# Patient Record
Sex: Male | Born: 1960 | Race: Black or African American | Hispanic: No | Marital: Married | Smoking: Never smoker
Health system: Southern US, Community
[De-identification: ages and names within clinical notes are randomized; demographics above are authoritative.]

## PROBLEM LIST (undated history)

## (undated) DIAGNOSIS — I10 Essential (primary) hypertension: Secondary | ICD-10-CM

## (undated) HISTORY — PX: BACK SURGERY: SHX140

---

## 2000-10-12 ENCOUNTER — Ambulatory Visit: Admission: RE | Admit: 2000-10-12 | Discharge: 2000-10-12 | Payer: Self-pay | Admitting: Neurosurgery

## 2000-10-23 ENCOUNTER — Inpatient Hospital Stay (HOSPITAL_COMMUNITY): Admission: RE | Admit: 2000-10-23 | Discharge: 2000-10-25 | Payer: Self-pay | Admitting: Neurosurgery

## 2000-10-23 ENCOUNTER — Encounter: Payer: Self-pay | Admitting: Neurosurgery

## 2003-09-05 ENCOUNTER — Inpatient Hospital Stay (HOSPITAL_COMMUNITY): Admission: AD | Admit: 2003-09-05 | Discharge: 2003-09-08 | Payer: Self-pay | Admitting: Psychiatry

## 2006-09-12 ENCOUNTER — Encounter: Admission: RE | Admit: 2006-09-12 | Discharge: 2006-09-12 | Payer: Self-pay | Admitting: Orthopedic Surgery

## 2006-11-28 ENCOUNTER — Inpatient Hospital Stay (HOSPITAL_COMMUNITY): Admission: RE | Admit: 2006-11-28 | Discharge: 2006-11-30 | Payer: Self-pay | Admitting: Orthopedic Surgery

## 2006-11-28 ENCOUNTER — Encounter (INDEPENDENT_AMBULATORY_CARE_PROVIDER_SITE_OTHER): Payer: Self-pay | Admitting: Specialist

## 2008-05-11 ENCOUNTER — Emergency Department (HOSPITAL_COMMUNITY): Admission: EM | Admit: 2008-05-11 | Discharge: 2008-05-11 | Payer: Self-pay | Admitting: Emergency Medicine

## 2008-07-01 ENCOUNTER — Emergency Department (HOSPITAL_COMMUNITY): Admission: EM | Admit: 2008-07-01 | Discharge: 2008-07-01 | Payer: Self-pay | Admitting: Emergency Medicine

## 2008-07-28 ENCOUNTER — Encounter: Admission: RE | Admit: 2008-07-28 | Discharge: 2008-07-28 | Payer: Self-pay | Admitting: Orthopedic Surgery
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
## 2011-05-12 NOTE — Op Note (Signed)
NAMESHARIFF, LASKY               ACCOUNT NO.:  [id number]   MEDICAL RECORD NO.:  [id number]          PATIENT TYPE:  INP   LOCATION:  3002                         FACILITY:  [facility]   PHYSICIAN:  Balinda Quails, M.D.    DATE OF BIRTH:  05-20-1961   DATE OF PROCEDURE:  11/28/2006  DATE OF DISCHARGE:                               OPERATIVE REPORT   COSURGEONS:  P Bud Face, MD and Nelda Severe, MD.   ANESTHETIC:  General endotracheal, anesthesiologist is Dr. Katrinka Blazing.   PREOPERATIVE DIAGNOSIS:  L3-4 degenerative disk disease.   POSTOPERATIVE DIAGNOSIS:  L3-4 degenerative disk disease.   PROCEDURE:  L3-4 anterior lumbar interbody fusion.   CLINICAL NOTE:  Mr. [name] is a 50 year old male with chronic  back pain and degenerative L3-4 disk disease.  He is brought to the  operating room at this time for planned L3-4 anterior lumbar interbody  fusion.   The patient was seen preoperatively in the holding area.  Operative  procedure explained including plans for anterior lumbar exposure.  Potential risks of the operative procedure including MI, CVA, infection,  major bleeding, ureteric injury, transfusion risks, hernia, ischemic  limb, and retrograde ejaculation and death were discussed.   OPERATIVE PROCEDURE:  The patient brought to the operating room stable  hemodynamic condition.  Placed under general endotracheal anesthesia.  Foley catheter central venous catheter placed.   Transverse skin incision was made just superior to umbilicus, left  paramedian.  Dissection carried down through subcutaneous tissue.  The  anterior rectus sheath undermined.  The anterior rectus sheath then  incised transversely.  The anterior rectus sheath mobilized off the  rectus muscles superiorly and inferiorly.  The short segment of rectus  muscle was then mobilized medially and laterally.  The inferior  epigastric vessels were identified, ligated with 2-0 silk and divided.  With the rectus  muscle then retracted medially, the posterior rectus  sheath was incised laterally.  This allowed mobilization of the  peritoneum off the posterior rectus sheath.  The retroperitoneal space  was then entered and the peritoneal contents rotated anteriorly.  The  rectus muscle identified.  This was followed medially to expose the  aorta.  Segmental vessels running across L3-L4 were ligated with clips  and divided.  The L3-4 disk base was identified.  The aorta was  retracted medially and the L3-4 disk base was identified.  The ureter  was retracted medially with the abdominal contents.  The sympathetic  chain was left laterally.  The L3-4 disk was exposed from left to right.  Reverse lip self-retaining blades were then placed on the lateral  margins of the disk.  This allowed complete exposure.  Dr. Alveda Reasons then  completed the excision of the disk and interbody fixation L3-4.   With completion of the procedure, the retractors were then removed.  The  retroperitoneal space examined, showed no retained instruments or  sponges.  No significant bleeding was present.  No major complications.   The rectus muscle then rotated back into position.  The anterior rectus  sheath was closed with running 0 Vicryl suture.  Subcutaneous tissue  closed running 3-0 Vicryl suture.  Skin closed 4-0 Vicryl.  Steri-Strips  applied.   The patient tolerated procedure well.  No apparent complications.  2+  left dorsalis pedis pulse present at termination of procedure.  The  patient transferred to the recover room in stable condition.      Balinda Quails, M.D.  Electronically Signed     PGH/MEDQ  D:  11/28/2006  T:  11/29/2006  Job:  [id number]   cc:   Nelda Severe, MD

## 2011-05-12 NOTE — H&P (Signed)
[facility]  Patient:    [name]                        MRN: [id number] Adm. Date:  [id number] Attending:  Josie Saunders                         History and Physical  REASON FOR ADMISSION:  Herniated lumbar disk.  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  He says that his low back and lower extremity pain are similar in severity and are currently about 8-9/10.  He said this started on August 27, 2000 when he was moving some filing cabinets.  He felt a pinch in his low back like he had pulled a muscle and since that time, he said he has been in miserable pain and so uncomfortable he is taking pain medication.  He says he cannot walk well.  He says that his leg has severe pain but he also feels that it is a little weak.  He denies any left lower extremity or any bowel or bladder dysfunction.  He says he cannot lie down.  He wears a back brace for support and says that without it, he is even more miserable.  He has come out of work because of his inability to function without pain medication and he is reluctant to work around machinery on pain medication because of safety issues.  He is also in extreme discomfort and says he cannot perform his normal duties at this time.  [name] presented with an MRI of the lumbar spine which was obtained at [facility] on September 20, 2000 which shows a foraminal and extra-foraminal disk herniation on the right at L3-4, causing compression of the right L3 nerve root, which mostly accounts for the patients right thigh pain.  At the L5-S1 level, there is a right paracentral annular tear but no evidence of any associated  disk herniation with very slight impression on the right side of the thecal sac without significant nerve root compression. There is also some mild bulging at the L4-5 level with slight flattening of the anterior thecal sac but without significant disk herniation or foraminal stenosis.  REVIEW OF SYSTEMS:  A detailed review of systems sheet was reviewed with the patient.  Pertinent positives:  Under eyes, he wears glasses; under ears, nose, mouth and throat, he notes a history of nose bleeds; under musculoskeletal, he notes a broken jaw.  His last chest x-ray was March 26, 2000 for the [facility]; he had an EKG at that time as well. All other systems were negative.  PAST MEDICAL HISTORY:  Current medical conditions:  Essentially none. Prior operations and hospitalizations:  None.  CURRENT MEDICATION:  Hydrocodone 10/650, which he takes every four hours.  ALLERGIES:  He has no known drug allergies.  HEIGHT AND WEIGHT:  He is 5-feet 9-inches tall, 137 pounds.  FAMILY HISTORY:  His mother is 10 and in good health.  His father is 39 and in good health.  SOCIAL HISTORY:  He is a nonsmoker and social drinker of alcoholic beverages. He is married and  his wife was present during the interview and discussion.  DIAGNOSTIC STUDIES:  We did diagnostic studies, as above.  PHYSICAL EXAMINATION  GENERAL APPEARANCE:  [name] is a very pleasant, extremely uncomfortable-appearing black male.  HEENT:  Normocephalic, atraumatic.  Pupils are equal, round and reactive to light.  Extraocular movements are intact.  Sclerae are white.  Conjunctivae pink.  Oropharynx benign.  Uvula midline.  NECK:  There are no masses, meningismus, deformities, tracheal deviation, jugular venous distention or carotid bruits.  There is normal cervical range of motion.  Spurlings test is negative without reproducible radicular pain, turning the patients head to either side.  Lhermittes sign is not  present with axial compression.  RESPIRATORY:  There is normal respiratory effort with good intercostal function.  LUNGS:  Clear to auscultation.  There are no rales, rhonchi or wheezing.  CARDIOVASCULAR:  The heart has regular rate and rhythm to auscultation.  No murmurs are appreciated.  There is no extremity edema, clubbing or cyanosis. There are palpable pedal pulses.  ABDOMEN:  Soft and nontender.  No hepatosplenomegaly appreciated or masses. There are active bowel sounds.  No guarding or rebound.  MUSCULOSKELETAL:  Patient walks about the examining room with an antalgic gait favoring his right lower extremity.  He is able to stand on his heels and toes and is able to squat independently on either leg.  He is only able to bend to the level of his mid-calf with outstretched upper extremities before he gets significant pain.  He has significant right paraspinous muscular spasm to palpation and also right sciatic notch discomfort; negative on the left. Straight leg raise is positive on the right at 45 degrees and negative on the left.  Negative Patricks test bilaterally.  NEUROLOGIC:  The patient is oriented to time, person and place and has good recall of both recent and remote memory with normal attention spasm and concentration.  Patient speaks with clear and fluent speech and exhibits normal language function and appropriate fund of knowledge.  Cranial nerve examination:  Pupils are equal, round and reactive to light.  Extraocular movements are full.  Visual fields are full to confrontational testing. Facial sensation and facial motor are intact and symmetric.  Hearing is intact to finger rub.  Palate is upgoing.  Shoulder shrug is symmetric.  Tongue protrudes in the midline.  Motor examination:  Motor strength is 5/5 in bilateral deltoids, biceps, triceps, hand grips, wrist extensors and interossei.  In the lower extremities, motor strength is 5/5 in hip flexion and  extension, quadriceps, hamstrings, plantarflexion, dorsiflexion and extensor hallucis longi.  On sensory examination, he notes numbness throughout  his entire right lower extremity but principally in the anterior and medial thigh and at the level of his knee.  Deep tendon reflexes are 2 in the biceps, triceps and brachioradialis, 2 at the knees and 2 at the ankles.  Great toes are downgoing to plantar stimulation.  No Hoffmann sign.  Cerebellar examination:  Normal coordination in the upper and lower extremities and normal rapid alternating movements.  Romberg test is negative.  IMPRESSION AND RECOMMENDATIONS:  [name] is a 50 year old man with a herniated disk at the L3-4 level on the right, which is extra-foraminal in position and appears to be compressing the right L3 nerve root, and is, I believe, the patients first current complaint of lumbar radiculopathy.  He has a smaller disk herniation at the L5-S1 level on the right without frank nerve root compression.  This is principally  an annular tear.  I do not see evidence on his examination for that of an S1 radiculopathy.  I have given [name] the options of pursuing conservative management including physical therapy and potentially of nerve root blocks and steroids injections or surgical intervention.  He says he is miserable and wants to get relief and cannot stand the pain any more and wishes to go ahead with surgery; I have therefore recommended to him that he undergo an extra-foraminal microdiskectomy at L3-4 on the right and this was set up with a surgical procedure initially for October 16, 2000 but this had to be changed because the patient was on the verge of being evicted from his dwelling and it was consequently rescheduled to October 23, 2000.  I reviewed the studies with the patient and went over significant physical examination.  I reviewed surgical models and discussed the typical hospital course and operative  and postoperative course and the potential risks and benefits of surgery.  The risks of surgery were discussed in detail and include, but are not limited to, the risks of anesthesia, blood loss, the possibility of hemorrhage or infection, damage to nerves, damage to blood vessels, injury to lumbar nerve root causing either temporary or permanent leg pain, numbness and/or weakness. There is a potential for spinal fluid leak from dural tear.  There is potential for post-laminectomy spondylolisthesis, recurrent disk rupture quoted at approximately 10%, failure to relieve pain, worsening of pain or need for further surgery.  Patient understands these risks and wishes to proceed.DD:  10/23/00 TD:  10/23/00 Job: [id number]

## 2011-05-12 NOTE — Op Note (Signed)
[place]  Patient:    [name], [name]                        MRN: [id number] Proc. Date: 10/23/00 Adm. Date:  [id number] Attending:  Josie Saunders                           Operative Report  PREOPERATIVE DIAGNOSIS:  Far lateral disc herniation L3-4 right with radiculopathy, degenerative disc disease and spondylosis.  POSTOPERATIVE DIAGNOSIS:  Far lateral disc herniation L3-4 right with radiculopathy, degenerative disc disease and spondylosis.  OPERATION:  Extra foraminal microdiskectomy L3-4 right with microdissection.  SURGEON:  Danae Orleans. Venetia Maxon, M.D.  ASSISTANT: Stefani Dama, M.D.  ANESTHESIA:  General endotracheal.  ESTIMATED BLOOD LOSS:  Less than 75 cc.  COMPLICATIONS:  None.  DISPOSITION:  Recovery.  INDICATIONS:  [name] is a 50 year old man with severe right lower extremity pain with an extra foraminal disc herniation at the L3-4 level on the right. It was elected to take him to surgery for microdiskectomy.  DESCRIPTION OF PROCEDURE:  [name] was brought to the operating room. Following the satisfactory uncomplicated induction of general endotracheal anesthesia and placement of intravenous lines, the patient was placed in the prone position on the Wilson frame.  His low back was shaved and prepped and draped and draped in the usual sterile fashion.  After prepping a spinal needle was placed and intraoperative x-ray confirmed spinal needle positioning overlying the L3-4 interspace.  Incision was then made in the area.  Planned incision was infiltrated with 0.25% Marcaine and 0.5% Lidocaine 1:200,000 epinephrine.  Incision was made from overlying the L3 and L4 spinous processes in the midline, carried to the lumbar dorsal fascia.  The lumbar dorsal fascia was then incised approximately 2 cm off the midline and a fascial flap was created.  Subperiosteal dissection was then performed exposing the spinous processes of  L3 and L4 and the L2-3 and L3-4 facet joints of the transverse process of L3.  A self retaining retractor was placed and intraoperative x-ray confirmed correct orientation with a probe overlying the transverse process of L3.  The Midas-Rex drill was then used to drill into the pars intra-articularis of L3 and this was then completed.  The bone removal was completed with 3 mm Kerrison rongeur.  Microscope was brought into the field. The L3 nerve root was identified as its coursed extra foraminally around the L3 pedicle.  The nerve root was decompressed.  It was found to be pushed cephalad and on the inferior side of the nerve root there was significant subligamentous disc herniation which was directly compressing the nerve root. Using microdissection technique, the disc herniation was identified and disc space was then incised with 15 blade.  Disc material was removed in a piece meal fashion.  There was a small amount of free disc material.  The majority of the disc material was spondylitic, fibrous and difficult to remove.  Using Epstein curets and surgical dynamics downgoing curets, the disc space was evacuated of loose disc material and the L3 nerve root was decompressed as it extended extra foraminally.  The nerve root appeared to be pulsatile and it appeared to return to its previous position without distortion due to compressive mass against it.  The wound was then copiously irrigated with Bacitracin saline.  The nerve root was bathed in 80 mg of Depo-Medrol and 2 cc  of Fentanyl.  Microscope was then taken out of the field.  The self retaining retractor was removed.  The fascial flap was reapproximated with 0 Vicryl sutures.  The subcutaneous tissues were reapproximated with 2-0 Vicryl interrupted inverted sutures.  Skin edges were reapproximated with interrupted 3-0 Vicryl subcuticular stitch.  The wound was dressed with Benzoin, Steri-Strips, soft gauge and tape.  The patient was  turned to a supine position on the OR stretcher, extubated in the operating room and taken to the recovery room in stable and satisfactory condition having tolerated his operation well.  Counts were correct at the end of the case. DD:  10/23/00 TD:  10/23/00 Job: [id number]

## 2011-05-12 NOTE — Discharge Summary (Signed)
[name], FORST NO.:  [id number]   MEDICAL RECORD NO.:  [id number]          PATIENT TYPE:  INP   LOCATION:  3002                         FACILITY:  [facility]   PHYSICIAN:  Nelda Severe, MD      DATE OF BIRTH:  03-04-1961   DATE OF ADMISSION:  11/28/2006  DATE OF DISCHARGE:  11/30/2006                               DISCHARGE SUMMARY   HOSPITAL COURSE:  This man was admitted for management of a degenerated  herniated disk at L3-4 causing back and right-sided leg pain.  The day  of admission, he was taken the operating room, where an anterior  decompression and fusion was carried out at L3-L4.  Postoperatively, his  course has been relatively smooth.  He did have some abdominal  distention earlier today, the day of admission, but this has passed and  he has had a bowel movement.  His pain is controlled well with oral  hydrocodone.   He has been on Lyrica 150 mg b.i.d. prior to his hospitalization and  during his hospitalization.  He has been instructed to decrease the dose  by half, taking 150 mg per day for 7 days and then he may stop it.  He  has been given a prescription for Vicodin (5/500) 1-2 q.6 hours p.r.n.  #80 tablets.   He is to avoid bending and lifting.  He may remove his dressing tomorrow  and leave the abdominal wound without a dressing.  He may shower once  the dressing has been removed.   He is to call me if he has any persistent fever, wound drainage, or any  problems that concern him whatsoever.   I would like him to phone the office this coming week and make an  appointment to follow up with me in approximately 3 weeks' time.   FINAL DIAGNOSIS:  L3-4 disk degeneration and disk herniation status post  anterior decompression and fusion.   DISCHARGE INSTRUCTIONS:  See above.   DISCHARGE MEDICATIONS:  See above.   CONDITION ON DISCHARGE:  His pain is improved and better than it was  prior to his coming into the hospital for surgery.  He  states that his  primary pain is in his abdominal incision.      Nelda Severe, MD  Electronically Signed     MT/MEDQ  D:  11/30/2006  T:  12/01/2006  Job:  [id number]

## 2011-05-12 NOTE — H&P (Signed)
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  He is  going to have an anterior interbody fusion and decompression.   The informed consent process has been executed.  General complications  have been discussed as follows:  Death, heart attack, stroke, phlebitis  of the leg or pelvic veins, pulmonary embolus, pneumonia, infection,  bleeding with the possibility of transfusion, and very small risk of  HIV, hepatitis, or transfusion reaction.  Specific risks have been  discussed including incisional hernia, vascular injury with circulation  problems in the lower extremities, sympathectomy with permanent warmth  of the left lower extremity, retrograde ejaculation, and sterility,  neurological complications, failure of graft healing, need for more  surgery.      Nelda Severe, MD  Electronically Signed     MT/MEDQ  D:  11/28/2006  T:  11/28/2006  Job:  [id number]
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
## 2011-05-12 NOTE — Op Note (Signed)
NAMEADRIAN, DINOVO               ACCOUNT NO.:  [id number]   MEDICAL RECORD NO.:  [id number]          PATIENT TYPE:  INP   LOCATION:  3002                         FACILITY:  [facility]   PHYSICIAN:  Balinda Quails, M.D.    DATE OF BIRTH:  Jan 21, 1961   DATE OF PROCEDURE:  DATE OF DISCHARGE:                               OPERATIVE REPORT   Audio too short to transcribe (less than 5 seconds)      P. Liliane Bade, M.D.     PGH/MEDQ  D:  11/28/2006  T:  11/28/2006  Job:  [id number]

## 2011-05-12 NOTE — Op Note (Signed)
[name], [name] NO.:  [id number]   MEDICAL RECORD NO.:  [id number]          PATIENT TYPE:  INP   LOCATION:  2899                         FACILITY:  [facility]   PHYSICIAN:  Nelda Severe, MD      DATE OF BIRTH:  January 04, 1961   DATE OF PROCEDURE:  11/28/2006  DATE OF DISCHARGE:                               OPERATIVE REPORT   CO-SURGEONS:  Nelda Severe, M.D.  Balinda Quails, M.D.   ASSISTANT:  Lianne Cure, P.A.-C.   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   OPERATIVE PROCEDURE:  Anterior decompression L3-L4 including foraminal  decompression right side L3-L4, L3-L4 interbody fusion with Synthes  Synfix anterior cage and internal fixation at L3-L4, insertion of BMP  (Infuse).   OPERATIVE NOTE:  The patient was placed under general endotracheal  anesthesia.  A Foley catheter was placed in the bladder.  Sequential  compression devices were placed on both lower extremities.  A pulse  oximeter was placed on the left great toe.  The patient was positioned  supine on a Jackson table.  The upper extremities were folded across the  chest and padded with foam and secured with a sheet.  A folded sheet was  placed under the lumbar spine to accentuate lordosis.  The hips and  knees were gently flexed and supported on pillows.  The abdomen was  shaved of hair.  The skin of the abdomen was prepped with DuraPrep.  Sterile draping was carried out and secured with Ioban.  The image  intensifier (C-arm) was positioned so it was bowed under the table and  positioned proximally.  It was then moved distally and the level of L3-  L4 determined and marked on the skin.   Next, Dr. Madilyn Fireman provided exposure to the L3-L4 disc through a transverse  incision made at approximately the L3-L4 level, mobilizing the  rectus  muscle and peritoneal sac, etc.  Once retractors were placed, we  performed annulotomy with a rectangular type of incision and then used  disc elevators to separate the nucleus from the endplates above and  below.  About 75-80% of the disc was removed in 1-2 fragments.  We then  used curets to curet away the annulus and nucleus posteriorly.  A long  nerve hook was used to palpate the tear through the posterolateral  corner of disc on the right side through which the disc herniation  occurred.   Next, a disc space distractor was placed and meticulous removal of the  posterior annulus and, in fact, ultimately the posterior longitudinal  ligament back to dura was carried out.  A formal foraminotomy was not  carried out on the left side but was on the right including removal of  disc fragments.  We ceased further efforts to decompress when the nerve  hook was freely admitted into the area and there appeared to be no  further compression.  Cartilage had already been meticulously curetted  away from  the endplates.   Next, a trial spacer was used and appropriate size chosen.  This was  checked under lateral image intensification.  Next, the Synfix implant  of appropriate foot print and thickness was inserted with the squid  inserter.  The jig for insertion of screws was then mounted on the  implant and the screws placed, two above and two below.  The jig was  removed.  AP and lateral fluoroscopic views were recorded and were  satisfactory.   At this point Dr. Madilyn Fireman, who had scrubbed out of the case after exposure  of the spine, returned and effected the closure and the closure will be  in his dictation.  In the recovery room, the patient was not complaining  of any right leg pain.  He had good strength and motion of both lower  extremities to command.  He had no leg pain.  The estimated blood loss  was 150 mL.  There were no interoperative complications.  Sponge and  needle counts  were correct.      Nelda Severe, MD  Electronically Signed     MT/MEDQ  D:  11/28/2006  T:  11/28/2006  Job:  [id number]

## 2011-11-22 ENCOUNTER — Emergency Department (HOSPITAL_COMMUNITY)
Admission: EM | Admit: 2011-11-22 | Discharge: 2011-11-22 | Disposition: A | Discharge status: Home / Self Care | Payer: Self-pay | Attending: Emergency Medicine | Admitting: Emergency Medicine

## 2011-11-22 DIAGNOSIS — R319 Hematuria, unspecified: Secondary | ICD-10-CM | POA: Insufficient documentation

## 2011-11-22 DIAGNOSIS — N509 Disorder of male genital organs, unspecified: Secondary | ICD-10-CM | POA: Insufficient documentation

## 2011-11-22 LAB — URINALYSIS, ROUTINE W REFLEX MICROSCOPIC
Nitrite: NEGATIVE
Protein, ur: NEGATIVE mg/dL
Urobilinogen, UA: 0.2 mg/dL (ref 0.0–1.0)

## 2011-11-22 LAB — URINE MICROSCOPIC-ADD ON

## 2011-11-22 NOTE — ED Provider Notes (Signed)
Medical screening examination/treatment/procedure(s) were performed by non-physician practitioner and as supervising physician I was immediately available for consultation/collaboration.   Kosta Schnitzler W Arden Tinoco, MD 11/22/11 1706 

## 2011-11-22 NOTE — ED Provider Notes (Signed)
History     CSN: [id number] Arrival date & time: 11/22/2011 11:04 AM   First MD Initiated Contact with Patient 11/22/11 1119      Chief Complaint  Patient presents with  . Hematuria    Hematuria  Began thursday, having pressure in his bladder when he moves his bowels.  He has blood from his penis when he urinates has sex and moves his bowels   Patient is a 50 y.o. male presenting with hematuria.  Hematuria Irritative symptoms do not include frequency or urgency. Pertinent negatives include no abdominal pain, chills, dysuria, fever, flank pain, genital pain, hesitancy, inability to urinate, nausea or vomiting. There is no history of kidney stones or STDs.   Patient presents to the emergency room with complaint of hematuria that has occurred for the past few days. Reports that it is in his urine, denies any rectal bleeding. Patient denies any other concerns. NO back, flank or abdominal pain. No history of kidney stones. Reports that he has never been seen for this problem in the past, has not been seen by a urologist.   History reviewed. No pertinent past medical history.  Past Surgical History  Procedure Date  . Back surgery     History reviewed. No pertinent family history.  History  Substance Use Topics  . Smoking status: Never Smoker   . Smokeless tobacco: Never Used  . Alcohol Use: Yes      Review of Systems  Constitutional: Negative for fever, chills, diaphoresis and appetite change.  HENT: Negative for neck pain.   Eyes: Negative for photophobia and visual disturbance.  Respiratory: Negative for cough, chest tightness and shortness of breath.   Cardiovascular: Negative for chest pain.  Gastrointestinal: Negative for nausea, vomiting and abdominal pain.  Genitourinary: Positive for hematuria. Negative for dysuria, hesitancy, urgency, frequency, flank pain, decreased urine volume, discharge, penile swelling, scrotal swelling, enuresis, difficulty urinating, genital  sores, penile pain and testicular pain.  Musculoskeletal: Negative for back pain.  Skin: Negative for rash.  Neurological: Negative for weakness and numbness.  All other systems reviewed and are negative.    Allergies  Review of patient's allergies indicates no known allergies.  Home Medications  No current outpatient prescriptions on file.  BP 144/92  Pulse 77  Temp(Src) 98.2 F (36.8 C) (Oral)  Resp 18  Ht 5\' 4"  (1.626 m)  Wt 137 lb (62.143 kg)  BMI 23.52 kg/m2  SpO2 99%  Physical Exam  Nursing note and vitals reviewed. Constitutional: He is oriented to person, place, and time. He appears well-developed and well-nourished. No distress.  HENT:  Head: Normocephalic and atraumatic.  Right Ear: External ear normal.  Left Ear: External ear normal.  Nose: Nose normal.  Mouth/Throat: No oropharyngeal exudate.  Eyes: EOM are normal. Pupils are equal, round, and reactive to light.  Neck: Normal range of motion. Neck supple.  Cardiovascular: Normal rate and regular rhythm.   Pulmonary/Chest: Effort normal and breath sounds normal. No respiratory distress.  Abdominal: Soft. Bowel sounds are normal. He exhibits no distension. There is no tenderness. There is no rebound and no guarding.  Genitourinary: Rectum normal and prostate normal. Guaiac negative stool. Right testis shows no swelling and no tenderness. Left testis shows no swelling and no tenderness. Penile tenderness present. No discharge found.       Chaperone present during examination.   Musculoskeletal: He exhibits no edema and no tenderness.  Neurological: He is alert and oriented to person, place, and time.  Skin: He  is not diaphoretic.    ED Course  Procedures (including critical care time)  Patient seen and evaluated.  VSS reviewed. . Nursing notes reviewed. Discussed with attending physician, Dr. Bebe Shaggy. Initial testing ordered. Will monitor the patient closely. They agree with the treatment plan and diagnosis.    Patient seen and re-evaluated. Resting comfortably. VSS stable. NAD. Patient notified of testing results. Stated agreement and understanding. Patient stated understanding to treatment plan and diagnosis. Stressed importance of following up with urologist.   Results for orders placed during the hospital encounter of 11/22/11  URINALYSIS, ROUTINE W REFLEX MICROSCOPIC      Component Value Range   Color, Urine YELLOW  YELLOW    Appearance CLEAR  CLEAR    Specific Gravity, Urine 1.008  1.005 - 1.030    pH 5.0  5.0 - 8.0    Glucose, UA NEGATIVE  NEGATIVE (mg/dL)   Hgb urine dipstick TRACE (*) NEGATIVE    Bilirubin Urine NEGATIVE  NEGATIVE    Ketones, ur NEGATIVE  NEGATIVE (mg/dL)   Protein, ur NEGATIVE  NEGATIVE (mg/dL)   Urobilinogen, UA 0.2  0.0 - 1.0 (mg/dL)   Nitrite NEGATIVE  NEGATIVE    Leukocytes, UA NEGATIVE  NEGATIVE   URINE MICROSCOPIC-ADD ON      Component Value Range   Squamous Epithelial / LPF RARE  RARE    Bacteria, UA RARE  RARE     MDM  Hematuria No pain, no abdominal pain, no penile discharge, no imaging needed at this time. Patient needs follow up with urologist. Patient stated agreement and understanding.        Demetrius Charity, PA 11/22/11 1309

## 2012-04-08 ENCOUNTER — Encounter (HOSPITAL_COMMUNITY): Payer: Self-pay

## 2012-04-08 ENCOUNTER — Emergency Department (HOSPITAL_COMMUNITY)
Admission: EM | Admit: 2012-04-08 | Discharge: 2012-04-08 | Disposition: A | Discharge status: Home / Self Care | Payer: MEDICAID | Attending: Emergency Medicine | Admitting: Emergency Medicine

## 2012-04-08 ENCOUNTER — Emergency Department (HOSPITAL_COMMUNITY): Payer: Self-pay

## 2012-04-08 DIAGNOSIS — M542 Cervicalgia: Secondary | ICD-10-CM | POA: Insufficient documentation

## 2012-04-08 DIAGNOSIS — R222 Localized swelling, mass and lump, trunk: Secondary | ICD-10-CM | POA: Insufficient documentation

## 2012-04-08 DIAGNOSIS — R197 Diarrhea, unspecified: Secondary | ICD-10-CM | POA: Insufficient documentation

## 2012-04-08 DIAGNOSIS — K5289 Other specified noninfective gastroenteritis and colitis: Secondary | ICD-10-CM | POA: Insufficient documentation

## 2012-04-08 DIAGNOSIS — K529 Noninfective gastroenteritis and colitis, unspecified: Secondary | ICD-10-CM

## 2012-04-08 DIAGNOSIS — R112 Nausea with vomiting, unspecified: Secondary | ICD-10-CM | POA: Insufficient documentation

## 2012-04-08 DIAGNOSIS — R109 Unspecified abdominal pain: Secondary | ICD-10-CM | POA: Insufficient documentation

## 2012-04-08 DIAGNOSIS — R079 Chest pain, unspecified: Secondary | ICD-10-CM | POA: Insufficient documentation

## 2012-04-08 LAB — COMPREHENSIVE METABOLIC PANEL
Albumin: 3.7 g/dL (ref 3.5–5.2)
BUN: 9 mg/dL (ref 6–23)
Calcium: 9.6 mg/dL (ref 8.4–10.5)
Chloride: 90 mEq/L — ABNORMAL LOW (ref 96–112)
Creatinine, Ser: 1.12 mg/dL (ref 0.50–1.35)
GFR calc non Af Amer: 74 mL/min — ABNORMAL LOW (ref >90)
Total Bilirubin: 0.5 mg/dL (ref 0.3–1.2)

## 2012-04-08 LAB — CBC
MCV: 68.9 fL — ABNORMAL LOW (ref 78.0–100.0)
Platelets: 170 10*3/uL (ref 150–400)
RBC: 6.02 MIL/uL — ABNORMAL HIGH (ref 4.22–5.81)
WBC: 14.6 10*3/uL — ABNORMAL HIGH (ref 4.0–10.5)

## 2012-04-08 LAB — DIFFERENTIAL
Eosinophils Relative: 0 % (ref 0–5)
Monocytes Relative: 8 % (ref 3–12)
Neutrophils Relative %: 88 % — ABNORMAL HIGH (ref 43–77)

## 2012-04-08 LAB — URINALYSIS, ROUTINE W REFLEX MICROSCOPIC
Glucose, UA: NEGATIVE mg/dL
Leukocytes, UA: NEGATIVE
Specific Gravity, Urine: 1.016 (ref 1.005–1.030)
pH: 6 (ref 5.0–8.0)

## 2012-04-08 LAB — URINE MICROSCOPIC-ADD ON

## 2012-04-08 MED ORDER — ONDANSETRON HCL 4 MG/2ML IJ SOLN
4.0000 mg | INTRAMUSCULAR | Status: AC | PRN
  Administered 2012-04-08 (×2): 4 mg via INTRAVENOUS
  Filled 2012-04-08 (×2): qty 2

## 2012-04-08 MED ORDER — SODIUM CHLORIDE 0.9 % IV BOLUS (SEPSIS): 1000.0000 mL | Freq: Once | INTRAVENOUS | Status: DC

## 2012-04-08 MED ORDER — MORPHINE SULFATE 4 MG/ML IJ SOLN
8.0000 mg | Freq: Once | INTRAMUSCULAR | Status: AC
  Administered 2012-04-08: 8 mg via INTRAVENOUS
  Filled 2012-04-08: qty 2

## 2012-04-08 MED ORDER — SODIUM CHLORIDE 0.9 % IV BOLUS (SEPSIS): 500.0000 mL | INTRAVENOUS | Status: DC

## 2012-04-08 MED ORDER — ONDANSETRON 4 MG PO TBDP: 4.0000 mg | ORAL_TABLET | Freq: Four times a day (QID) | ORAL | Status: AC | PRN

## 2012-04-08 MED ORDER — ACETAMINOPHEN 325 MG PO TABS
650.0000 mg | ORAL_TABLET | Freq: Once | ORAL | Status: AC
  Administered 2012-04-08: 650 mg via ORAL
  Filled 2012-04-08: qty 2

## 2012-04-08 MED ORDER — SODIUM CHLORIDE 0.9 % IV BOLUS (SEPSIS)
1000.0000 mL | Freq: Once | INTRAVENOUS | Status: AC
  Administered 2012-04-08: 1000 mL via INTRAVENOUS

## 2012-04-08 MED ORDER — OXYCODONE-ACETAMINOPHEN 5-325 MG PO TABS: 1.0000 | ORAL_TABLET | Freq: Four times a day (QID) | ORAL | Status: AC | PRN

## 2012-04-08 MED ORDER — MORPHINE SULFATE 4 MG/ML IJ SOLN
4.0000 mg | Freq: Once | INTRAMUSCULAR | Status: AC
  Administered 2012-04-08: 4 mg via INTRAVENOUS
  Filled 2012-04-08: qty 1

## 2012-04-08 NOTE — ED Notes (Addendum)
C/o n/v/d, anterior neck & left groin pain onset Sat with chills. Denies injury. No edema, redness noted. Denies sore throat. States groin pain radiates down leg, c/o painful & difficulty walking.  Reports last diarrhea 0400, last emesis 0300. Denies bloody stools. States unable to keep anything down, even water

## 2012-04-08 NOTE — Discharge Instructions (Signed)
Follow up with your primary care doctor about your hospital visit. Continue to hydrate orally.Take all medications as prescribed & use Zofran as directed for nausea & vomiting.  Read the instructions below for reasons to return to the ER.   The 'BRAT' diet is suggested, then progress to diet as tolerated as symptoms abate. Call if bloody stools, persistent diarrhea, vomiting, fever or abdominal pain. Bananas.  Rice.  Applesauce.  Toast (and other simple starches such as crackers, potatoes, noodles).   SEEK IMMEDIATE MEDICAL ATTENTION IF:  You begin having localized abdominal pain that does not go away or becomes severe (The right side could  possibly be appendicitis. In an adult, the left lower portion of the abdomen could be colitis or diverticulitis)   A temperature above 101 develops  Repeated vomiting occurs (multiple uncontrollable episodes) or you are unable to keep fluids down  Blood is being passed in stools or vomit (bright red or black tarry stools).   Return also if you develop chest pain, difficulty breathing, dizziness or fainting, or become confused, poorly responsive, or inconsolable (young children).  Follow up with the Gastroenterologist or general surgeon listed above for further evaluation of your abdominal pain. Only use your pain medication for severe pain. Do not operate heavy machinery while on pain medication or muscle relaxer. Note that your pain medication contains acetaminophen (Tylenol) & its is not reccommended that you use additional acetaminophen (Tylenol) while taking this medication.   Abdominal Pain  Your exam might not show the exact reason you have abdominal pain. Since there are many different causes of abdominal pain, another checkup and more tests may be needed. It is very important to follow up for lasting (persistent) or worsening symptoms. A possible cause of abdominal pain in any person who still has his or her appendix is acute appendicitis.  Appendicitis is often hard to diagnose. Normal blood tests, urine tests, ultrasound, and CT scans do not completely rule out early appendicitis or other causes of abdominal pain. Sometimes, only the changes that happen over time will allow appendicitis and other causes of abdominal pain to be determined. Other potential problems that may require surgery may also take time to become more apparent. Because of this, it is important that you follow all of the instructions below.   HOME CARE INSTRUCTIONS  Do not take laxatives unless directed by your caregiver. Rest as much as possible.  Do not eat solid food until your pain is gone: A diet of water, weak decaffeinated tea, broth or bouillon, gelatin, oral rehydration solutions (ORS), frozen ice pops, or ice chips may be helpful.  When pain is gone: Start a light diet (dry toast, crackers, applesauce, or white rice). Increase the diet slowly as long as it does not bother you. Eat no dairy products (including cheese and eggs) and no spicy, fatty, fried, or high-fiber foods.  Use no alcohol, caffeine, or cigarettes.  Take your regular medicines unless your caregiver told you not to.  Take any prescribed medicine as directed.   SEEK IMMEDIATE MEDICAL CARE IF:  The pain does not go away.  You have a fever >101 that persists You keep throwing up (vomiting) or cannot drink liquids.  The pain becomes localized (Pain in the right side could possibly be appendicitis. In an adult, pain in the left lower portion of the abdomen could be colitis or diverticulitis). You pass bloody or black tarry stools.  You have shaking chills.  There is blood in your vomit or  you see blood in your bowel movements.  Your bowel movements stop (become blocked) or you cannot pass gas.  You have bloody, frequent, or painful urination.  You have yellow discoloration in the skin or whites of the eyes.  Your stomach becomes bloated or bigger.  You have dizziness or fainting.  You  have chest or back pain.      RESOURCE GUIDE  Dental Problems  Patients with Medicaid: [facility] [phone] [address]                                           [phone] [address] Phone:  [phone]                                                  Phone:  [phone]  If unable to pay or uninsured, contact:  [facility] or [facility]. to become qualified for the adult dental clinic.  Chronic Pain Problems Contact [facility]  [phone] Patients need to be referred by their primary care doctor.  Insufficient Money for Medicine Contact [employer]:  call "211" or [facility] ([phone].  No Primary Care Doctor Call [facility]  ([phone] Other agencies that provide inexpensive medical care    [facility]  [phone]    [facility]  [phone]    [facility]  [phone]    [facility]  [phone]    [facility]  8302335280    [facility]  [phone]  Psychological Services [facility]  [phone] [facility]  [phone] [facility] Mental Health   [phone] (emergency services [phone])  Substance Abuse Resources [facility]  (424) 888-1124 [facility] (801)471-9010 [facility] [place] ([phone] Floydene Flock [phone] Residential & Outpatient Substance Abuse Program  215-241-9087  Abuse/Neglect [facility] Child Abuse Hotline 820-447-9315 [facility] Child Abuse Hotline [phone] (After Hours)  Emergency Shelter [facility] 709-643-1510  Maternity Homes Room at the [place] [facility] [place] 302-502-9496 [facility] ([phone]  MRSA Hotline #:   207-514-1403    [facility] Resources  [facility]     [employer]                          [facility] [address] [place]                        [address]      [address]  [place]                                                Cristobal Goldmann Phone:  [phone]                                   Phone:  [phone]                 Phone:  [phone]  [facility] Mental Health Phone:  [phone]  [facility] Child Abuse Hotline 301-282-1288 8626356285 (After Hours)

## 2012-04-08 NOTE — ED Notes (Signed)
Patient presents with nausea, vomiting, diarrhea; left groin and right upper neck swelling.  All symptoms have been present for 3 days.

## 2012-04-08 NOTE — ED Provider Notes (Signed)
History     CSN: [id number]  Arrival date & time 04/08/12  0709   First MD Initiated Contact with Patient 04/08/12 0720      Chief Complaint  Patient presents with  . Emesis  . Diarrhea    (Consider location/radiation/quality/duration/timing/severity/associated sxs/prior treatment) HPI Comments: Patient with a history of back surgery presents emergency department with chief complaint of nausea, vomiting, and diarrhea.  Patient states that onset of symptoms began 3 days ago and that he has had about 8 diarrhea episodes and 6 emesis episodes a day.  Patient denies any abdominal pain.  Associated symptoms include right neck pain and left groin pain.  Severity of pain is rated as a 10/10 and described as a "pinch or cutting pain."  Symptoms have been gradually worsening over time which has brought him into the emergency department because they have not resolved.  Patient denies hematuria, hematemesis, melena, hematochezia, abdominal pain, chest pain.  No other complaints this time.  Patient is a 51 y.o. male presenting with vomiting and diarrhea. The history is provided by the patient.  Emesis  Associated symptoms include diarrhea.  Diarrhea The primary symptoms include vomiting and diarrhea.    History reviewed. No pertinent past medical history.  Past Surgical History  Procedure Date  . Back surgery     No family history on file.  History  Substance Use Topics  . Smoking status: Never Smoker   . Smokeless tobacco: Never Used  . Alcohol Use: Yes      Review of Systems  Gastrointestinal: Positive for vomiting and diarrhea.  All other systems reviewed and are negative.    Allergies  Review of patient's allergies indicates no known allergies.  Home Medications   Current Outpatient Rx  Name Route Sig Dispense Refill  . GOODYS BODY PAIN PO Oral Take 1 Package by mouth daily as needed. For pain    . NAPROXEN SODIUM 220 MG PO TABS Oral Take 440 mg by mouth daily as  needed. For pain      BP 140/90  Pulse 98  Temp(Src) 99 F (37.2 C) (Oral)  Resp 21  SpO2 99%  Physical Exam  Constitutional: He is oriented to person, place, and time. He appears well-developed and well-nourished. No distress.  HENT:  Head: Normocephalic and atraumatic.  Mouth/Throat: Oropharynx is clear and moist. No oropharyngeal exudate.  Eyes: Conjunctivae and EOM are normal. Pupils are equal, round, and reactive to light. No scleral icterus.  Neck: Normal range of motion. Neck supple. No tracheal deviation present. No thyromegaly present.  Cardiovascular: Normal rate, regular rhythm, normal heart sounds and intact distal pulses.   Pulmonary/Chest: Effort normal and breath sounds normal. No stridor. No respiratory distress. He has no wheezes.         Chest wall ttp with mild swelling. No cervical lymphadenopathy   Abdominal: Soft. There is no tenderness.       Soft non ttp, non pulsatile aorta  Genitourinary:       Exam chaperoned.  No ttp of penis or scrotum. Left groin tender, pulses intact, no lymphadenopathy. No abnormal discharge.   Musculoskeletal: Normal range of motion. He exhibits no edema and no tenderness.  Neurological: He is alert and oriented to person, place, and time. Coordination normal.  Skin: Skin is warm and dry. No rash noted. He is not diaphoretic. No erythema. No pallor.  Psychiatric: He has a normal mood and affect. His behavior is normal.    ED Course  Procedures (including  critical care time)  Labs Reviewed  COMPREHENSIVE METABOLIC PANEL - Abnormal; Notable for the following:    Sodium 127 (*)    Chloride 90 (*)    Glucose, Bld 149 (*)    Total Protein 8.5 (*)    AST 68 (*) HEMOLYSIS AT THIS LEVEL MAY AFFECT RESULT   GFR calc non Af Amer 74 (*)    GFR calc Af Amer 86 (*)    All other components within normal limits  CBC - Abnormal; Notable for the following:    WBC 14.6 (*)    RBC 6.02 (*)    MCV 68.9 (*)    MCH 24.3 (*)    All other  components within normal limits  DIFFERENTIAL - Abnormal; Notable for the following:    Neutrophils Relative 88 (*)    Lymphocytes Relative 4 (*)    Neutro Abs 12.8 (*)    Lymphs Abs 0.6 (*)    Monocytes Absolute 1.2 (*)    All other components within normal limits  URINALYSIS, ROUTINE W REFLEX MICROSCOPIC - Abnormal; Notable for the following:    Hgb urine dipstick SMALL (*)    Protein, ur 100 (*)    All other components within normal limits  URINE MICROSCOPIC-ADD ON   No results found.   No diagnosis found.    MDM  Gastroenteritis  Patient with symptoms consistent with viral gastroenteritis.  Vitals are stable, no fever.  No signs of dehydration, tolerating PO fluids > 6 oz.  Lungs are clear.  No focal abdominal pain, no concern for appendicitis, cholecystitis, pancreatitis, ruptured viscus, UTI, kidney stone, or any other abdominal etiology.  Supportive therapy indicated with return if symptoms worsen.  Patient counseled to get re-checked in 2-3 days. Pt seen with and re-examined by dr. Estell Harpin prior to dc who agrees with above plan.          Jaci Carrel, [place] 04/08/12 1206

## 2012-04-08 NOTE — ED Notes (Signed)
Patient transported to X-ray 

## 2012-04-08 NOTE — ED Provider Notes (Signed)
Medical screening examination/treatment/procedure(s) were performed by non-physician practitioner and as supervising physician I was immediately available for consultation/collaboration.   Benny Lennert, MD 04/08/12 1409

## 2012-04-08 NOTE — ED Provider Notes (Signed)
History     CSN: [id number]  Arrival date & time 04/08/12  0709   First MD Initiated Contact with Patient 04/08/12 0720      Chief Complaint  Patient presents with  . Emesis  . Diarrhea    (Consider location/radiation/quality/duration/timing/severity/associated sxs/prior treatment) Patient is a 51 y.o. male presenting with vomiting. The history is provided by the patient. No language interpreter was used.  Emesis  This is a new problem. The current episode started 3 to 5 hours ago. The problem occurs 2 to 4 times per day. The problem has been gradually improving. The emesis has an appearance of stomach contents. There has been no fever. Pertinent negatives include no abdominal pain, no chills, no cough, no diarrhea and no headaches. Risk factors: none.    History reviewed. No pertinent past medical history.  Past Surgical History  Procedure Date  . Back surgery     No family history on file.  History  Substance Use Topics  . Smoking status: Never Smoker   . Smokeless tobacco: Never Used  . Alcohol Use: Yes      Review of Systems  Constitutional: Negative for chills and fatigue.  HENT: Negative for congestion, sinus pressure and ear discharge.   Eyes: Negative for discharge.  Respiratory: Negative for cough.   Cardiovascular: Negative for chest pain.  Gastrointestinal: Positive for vomiting. Negative for abdominal pain and diarrhea.  Genitourinary: Negative for frequency and hematuria.  Musculoskeletal: Negative for back pain.  Skin: Negative for rash.  Neurological: Negative for seizures and headaches.  Hematological: Negative.   Psychiatric/Behavioral: Negative for hallucinations.    Allergies  Review of patient's allergies indicates no known allergies.  Home Medications   Current Outpatient Rx  Name Route Sig Dispense Refill  . GOODYS BODY PAIN PO Oral Take 1 Package by mouth daily as needed. For pain    . NAPROXEN SODIUM 220 MG PO TABS Oral Take 440 mg  by mouth daily as needed. For pain      BP 127/86  Pulse 97  Temp(Src) 99 F (37.2 C) (Oral)  Resp 20  SpO2 100%  Physical Exam  Constitutional: He is oriented to person, place, and time. He appears well-developed.  HENT:  Head: Normocephalic and atraumatic.  Eyes: Conjunctivae and EOM are normal. No scleral icterus.  Neck: Neck supple. No thyromegaly present.  Cardiovascular: Normal rate and regular rhythm.  Exam reveals no gallop and no friction rub.   No murmur heard. Pulmonary/Chest: No stridor. He has no wheezes. He has no rales. He exhibits no tenderness.  Abdominal: He exhibits no distension. There is no tenderness. There is no rebound.  Musculoskeletal: Normal range of motion. He exhibits no edema.  Lymphadenopathy:    He has no cervical adenopathy.  Neurological: He is oriented to person, place, and time. Coordination normal.  Skin: No rash noted. No erythema.  Psychiatric: He has a normal mood and affect. His behavior is normal.    ED Course  Procedures (including critical care time)   Labs Reviewed  COMPREHENSIVE METABOLIC PANEL  CBC  DIFFERENTIAL   No results found.   No diagnosis found.    MDM          Benny Lennert, MD 04/10/12 [phone]

## 2012-04-12 ENCOUNTER — Encounter (HOSPITAL_COMMUNITY): Payer: Self-pay | Admitting: Emergency Medicine

## 2012-04-12 ENCOUNTER — Emergency Department (HOSPITAL_COMMUNITY): Payer: Medicaid Other

## 2012-04-12 ENCOUNTER — Inpatient Hospital Stay (HOSPITAL_COMMUNITY)
Admission: EM | Admit: 2012-04-12 | Discharge: 2012-05-07 | DRG: 853 | Disposition: A | Discharge status: Home / Self Care | Payer: Medicaid Other | Attending: Internal Medicine | Admitting: Internal Medicine

## 2012-04-12 DIAGNOSIS — J9601 Acute respiratory failure with hypoxia: Secondary | ICD-10-CM | POA: Diagnosis present

## 2012-04-12 DIAGNOSIS — R609 Edema, unspecified: Secondary | ICD-10-CM | POA: Diagnosis present

## 2012-04-12 DIAGNOSIS — M109 Gout, unspecified: Secondary | ICD-10-CM | POA: Diagnosis present

## 2012-04-12 DIAGNOSIS — R6520 Severe sepsis without septic shock: Secondary | ICD-10-CM | POA: Diagnosis present

## 2012-04-12 DIAGNOSIS — Z7982 Long term (current) use of aspirin: Secondary | ICD-10-CM

## 2012-04-12 DIAGNOSIS — L02519 Cutaneous abscess of unspecified hand: Secondary | ICD-10-CM | POA: Diagnosis present

## 2012-04-12 DIAGNOSIS — D649 Anemia, unspecified: Secondary | ICD-10-CM | POA: Diagnosis present

## 2012-04-12 DIAGNOSIS — L03119 Cellulitis of unspecified part of limb: Secondary | ICD-10-CM | POA: Diagnosis present

## 2012-04-12 DIAGNOSIS — J9600 Acute respiratory failure, unspecified whether with hypoxia or hypercapnia: Secondary | ICD-10-CM | POA: Diagnosis present

## 2012-04-12 DIAGNOSIS — L0211 Cutaneous abscess of neck: Secondary | ICD-10-CM | POA: Diagnosis present

## 2012-04-12 DIAGNOSIS — B951 Streptococcus, group B, as the cause of diseases classified elsewhere: Secondary | ICD-10-CM | POA: Diagnosis present

## 2012-04-12 DIAGNOSIS — A419 Sepsis, unspecified organism: Secondary | ICD-10-CM | POA: Diagnosis present

## 2012-04-12 DIAGNOSIS — K6812 Psoas muscle abscess: Secondary | ICD-10-CM | POA: Diagnosis present

## 2012-04-12 DIAGNOSIS — L02818 Cutaneous abscess of other sites: Secondary | ICD-10-CM | POA: Diagnosis present

## 2012-04-12 DIAGNOSIS — D72829 Elevated white blood cell count, unspecified: Secondary | ICD-10-CM | POA: Diagnosis present

## 2012-04-12 DIAGNOSIS — L0390 Cellulitis, unspecified: Secondary | ICD-10-CM

## 2012-04-12 DIAGNOSIS — B955 Unspecified streptococcus as the cause of diseases classified elsewhere: Secondary | ICD-10-CM | POA: Diagnosis present

## 2012-04-12 DIAGNOSIS — A409 Streptococcal sepsis, unspecified: Principal | ICD-10-CM | POA: Diagnosis present

## 2012-04-12 DIAGNOSIS — M009 Pyogenic arthritis, unspecified: Secondary | ICD-10-CM | POA: Diagnosis present

## 2012-04-12 DIAGNOSIS — Z79899 Other long term (current) drug therapy: Secondary | ICD-10-CM

## 2012-04-12 DIAGNOSIS — E876 Hypokalemia: Secondary | ICD-10-CM | POA: Diagnosis not present

## 2012-04-12 DIAGNOSIS — R6510 Systemic inflammatory response syndrome (SIRS) of non-infectious origin without acute organ dysfunction: Secondary | ICD-10-CM | POA: Diagnosis present

## 2012-04-12 DIAGNOSIS — B370 Candidal stomatitis: Secondary | ICD-10-CM | POA: Diagnosis present

## 2012-04-12 DIAGNOSIS — J90 Pleural effusion, not elsewhere classified: Secondary | ICD-10-CM | POA: Diagnosis present

## 2012-04-12 DIAGNOSIS — M961 Postlaminectomy syndrome, not elsewhere classified: Secondary | ICD-10-CM | POA: Diagnosis present

## 2012-04-12 DIAGNOSIS — M25471 Effusion, right ankle: Secondary | ICD-10-CM | POA: Diagnosis present

## 2012-04-12 LAB — BASIC METABOLIC PANEL
BUN: 23 mg/dL (ref 6–23)
Chloride: 83 mEq/L — ABNORMAL LOW (ref 96–112)
Glucose, Bld: 117 mg/dL — ABNORMAL HIGH (ref 70–99)
Potassium: 3.9 mEq/L (ref 3.5–5.1)

## 2012-04-12 LAB — CBC
HCT: 36.6 % — ABNORMAL LOW (ref 39.0–52.0)
MCH: 23 pg — ABNORMAL LOW (ref 26.0–34.0)
MCV: 66.4 fL — ABNORMAL LOW (ref 78.0–100.0)
RDW: 14.5 % (ref 11.5–15.5)
WBC: 17.3 10*3/uL — ABNORMAL HIGH (ref 4.0–10.5)

## 2012-04-12 NOTE — ED Notes (Signed)
PT. PRESENTS WITH LEFT HAND PAIN / SWELLING ONSET YESTERDAY , DENIES INJURY OR FALL , NO FEVER OR CHILLS.

## 2012-04-13 ENCOUNTER — Encounter (HOSPITAL_COMMUNITY): Payer: Self-pay | Admitting: Family Medicine

## 2012-04-13 ENCOUNTER — Emergency Department (HOSPITAL_COMMUNITY): Payer: Medicaid Other

## 2012-04-13 DIAGNOSIS — M961 Postlaminectomy syndrome, not elsewhere classified: Secondary | ICD-10-CM | POA: Diagnosis present

## 2012-04-13 DIAGNOSIS — A419 Sepsis, unspecified organism: Secondary | ICD-10-CM | POA: Diagnosis present

## 2012-04-13 DIAGNOSIS — I959 Hypotension, unspecified: Secondary | ICD-10-CM | POA: Diagnosis present

## 2012-04-13 DIAGNOSIS — L02519 Cutaneous abscess of unspecified hand: Secondary | ICD-10-CM | POA: Diagnosis present

## 2012-04-13 LAB — DIFFERENTIAL
Basophils Absolute: 0 10*3/uL (ref 0.0–0.1)
Eosinophils Relative: 0 % (ref 0–5)
Lymphs Abs: 0.7 10*3/uL (ref 0.7–4.0)
Monocytes Absolute: 1.7 10*3/uL — ABNORMAL HIGH (ref 0.1–1.0)
Monocytes Relative: 10 % (ref 3–12)
Neutro Abs: 14.9 10*3/uL — ABNORMAL HIGH (ref 1.7–7.7)

## 2012-04-13 LAB — GLUCOSE, CAPILLARY: Glucose-Capillary: 90 mg/dL (ref 70–99)

## 2012-04-13 MED ORDER — SODIUM CHLORIDE 0.9 % IV SOLN
INTRAVENOUS | Status: DC
  Administered 2012-04-13: 02:00:00 via INTRAVENOUS

## 2012-04-13 MED ORDER — VANCOMYCIN HCL 500 MG IV SOLR
500.0000 mg | Freq: Two times a day (BID) | INTRAVENOUS | Status: DC
  Administered 2012-04-13 – 2012-04-14 (×2): 500 mg via INTRAVENOUS
  Filled 2012-04-13 (×3): qty 500

## 2012-04-13 MED ORDER — IOHEXOL 300 MG/ML  SOLN
100.0000 mL | Freq: Once | INTRAMUSCULAR | Status: AC | PRN
  Administered 2012-04-13: 100 mL via INTRAVENOUS

## 2012-04-13 MED ORDER — MOXIFLOXACIN HCL IN NACL 400 MG/250ML IV SOLN
400.0000 mg | Freq: Once | INTRAVENOUS | Status: AC
  Administered 2012-04-13: 400 mg via INTRAVENOUS
  Filled 2012-04-13: qty 250

## 2012-04-13 MED ORDER — ONDANSETRON HCL 4 MG/2ML IJ SOLN
4.0000 mg | Freq: Four times a day (QID) | INTRAMUSCULAR | Status: DC | PRN
  Administered 2012-04-13: 4 mg via INTRAVENOUS
  Filled 2012-04-13: qty 2

## 2012-04-13 MED ORDER — FENTANYL CITRATE 0.05 MG/ML IJ SOLN
50.0000 ug | Freq: Once | INTRAMUSCULAR | Status: AC
  Administered 2012-04-13: 50 ug via INTRAVENOUS
  Filled 2012-04-13: qty 2

## 2012-04-13 MED ORDER — SODIUM CHLORIDE 0.9 % IV SOLN
1000.0000 mL | INTRAVENOUS | Status: DC
  Administered 2012-04-13 (×2): 1000 mL via INTRAVENOUS

## 2012-04-13 MED ORDER — ONDANSETRON 4 MG PO TBDP
4.0000 mg | ORAL_TABLET | Freq: Four times a day (QID) | ORAL | Status: DC | PRN
  Filled 2012-04-13: qty 1

## 2012-04-13 MED ORDER — SODIUM CHLORIDE 0.9 % IV SOLN
INTRAVENOUS | Status: DC
  Administered 2012-04-13 – 2012-04-14 (×3): via INTRAVENOUS
  Administered 2012-04-14: 100 mL via INTRAVENOUS
  Administered 2012-04-14: 200 mL via INTRAVENOUS
  Administered 2012-04-14: 100 mL via INTRAVENOUS
  Administered 2012-04-15: 1000 mL via INTRAVENOUS
  Administered 2012-04-16 (×2): via INTRAVENOUS

## 2012-04-13 MED ORDER — FENTANYL CITRATE 0.05 MG/ML IJ SOLN
50.0000 ug | INTRAMUSCULAR | Status: DC | PRN
  Administered 2012-04-13 – 2012-04-18 (×6): 50 ug via INTRAVENOUS
  Filled 2012-04-13 (×6): qty 2

## 2012-04-13 MED ORDER — VANCOMYCIN HCL IN DEXTROSE 1-5 GM/200ML-% IV SOLN
1000.0000 mg | Freq: Once | INTRAVENOUS | Status: AC
  Administered 2012-04-13: 1000 mg via INTRAVENOUS
  Filled 2012-04-13: qty 200

## 2012-04-13 MED ORDER — SODIUM CHLORIDE 0.9 % IJ SOLN
3.0000 mL | Freq: Two times a day (BID) | INTRAMUSCULAR | Status: DC
  Administered 2012-04-13 – 2012-05-04 (×16): 3 mL via INTRAVENOUS

## 2012-04-13 MED ORDER — ACETAMINOPHEN 500 MG PO TABS
500.0000 mg | ORAL_TABLET | Freq: Four times a day (QID) | ORAL | Status: DC | PRN
  Administered 2012-04-16: 500 mg via ORAL
  Filled 2012-04-13: qty 1

## 2012-04-13 MED ORDER — LORAZEPAM 2 MG/ML IJ SOLN
1.0000 mg | Freq: Once | INTRAMUSCULAR | Status: AC
  Administered 2012-04-13: 1 mg via INTRAVENOUS
  Filled 2012-04-13: qty 1

## 2012-04-13 MED ORDER — VANCOMYCIN HCL IN DEXTROSE 1-5 GM/200ML-% IV SOLN
1000.0000 mg | Freq: Two times a day (BID) | INTRAVENOUS | Status: DC
  Filled 2012-04-13 (×2): qty 200

## 2012-04-13 MED ORDER — MORPHINE SULFATE 2 MG/ML IJ SOLN
1.0000 mg | INTRAMUSCULAR | Status: DC | PRN
  Administered 2012-04-13 – 2012-04-17 (×12): 1 mg via INTRAVENOUS
  Filled 2012-04-13 (×12): qty 1

## 2012-04-13 NOTE — Plan of Care (Signed)
Problem: Consults Goal: Cellulitis Patient Education See Patient Education Module for education specifics.  Outcome: Completed/Met Date Met:  04/13/12 Cellulitis education given

## 2012-04-13 NOTE — Progress Notes (Signed)
04/13/12 1256  Discharge Planning  Type of Residence Private residence  Living Arrangements Spouse/significant other  Home Care Services No  Support Systems Spouse/significant other  Do you have any problems obtaining your medications? Yes (Describe) (Not funded)  Once you are discharged, how will you get to your follow-up appointment? Family  Expected Discharge Date 04/16/12  Case Management Consult Needed Yes (Comment) (No funding)  Social Work Consult Needed No

## 2012-04-13 NOTE — Consult Note (Signed)
HAND PT SEEN/EXAMINED IN CDU PT'S CHART REVIEWED PT'S HAND SWOLLEN RED DORSALLY NO AREAS OF FLUCTUANCE EXAM C/W CELLULITIS WITHOUT ABSCESS COLLECTION CT SCAN REVIEWED AT TIME DO NOT RECOMMEND SURGICAL EXPLORATION AND DRAINAGE MEDICAL ADMISSION FOR CELLULITIS IV ANTIBIOTICS RECOMMEND ORTHO TECH SPLINTING VOLAR HAND/WRIST AND FOREARM TO IMMOBILIZE HAND ELEVATION OF HAND AND NO MOVEMENT CONTACT ME IF CONDITION WORSENS [phone] PAGER

## 2012-04-13 NOTE — Progress Notes (Signed)
[name] [id number] Admission Data: 04/13/2012 2:59 PM Attending Provider: Rhetta Mura, MD  [id number], MD, MD Consults/ Treatment Team:    [name] is a 51 y.o. male patient admitted from ED awake, alert  & orientated  X 3,  Full Code, VSS - Blood pressure 126/80, pulse 112, temperature 98.1 F (36.7 C), temperature source Oral, resp. rate 24, height 5\' 4"  (1.626 m), weight 61 kg (134 lb 7.7 oz), SpO2 95.00%., no c/o shortness of breath, no c/o chest pain, no distress noted. [id number] placed and pt is currently running:sinus tachycardia.   IV site WDL:  antecubital right, condition patent and no redness with a transparent dsg that's clean dry and intact.  Allergies:  No Known Allergies   History reviewed. No pertinent past medical history.  History:  obtained from the patient. Tobacco/alcohol: denied none  Pt orientation to unit, room and routine. Information packet given to patient/family and safety video watched.  Admission INP armband ID verified with patient/family, and in place. SR up x 2, fall risk assessment complete with Patient and family verbalizing understanding of risks associated with falls. Pt verbalizes an understanding of how to use the call bell and to call for help before getting out of bed.  Skin, clean-dry- intact without evidence of bruising, or skin tears.   No evidence of skin break down noted on exam. L hand red and swollen  Will cont to monitor and assist as needed.  [name] Lippert Consuella Lose, RN 04/13/2012 2:59 PM

## 2012-04-13 NOTE — Progress Notes (Signed)
CRITICAL VALUE ALERT  Critical value received:  Blood culture  Date of notification:  04/13/12  Time of notification:  1935  Critical value read back:yes  Nurse who received alert:  Macarthur Critchley  MD notified (1st page):  lynch  Time of first page:  1940  MD notified (2nd page):  Time of second page:  Responding MD:  lynch  Time MD responded:  [phone]

## 2012-04-13 NOTE — ED Provider Notes (Signed)
History     CSN: [id number]  Arrival date & time 04/12/12  2247   First MD Initiated Contact with Patient 04/13/12 0038      Chief Complaint  Patient presents with  . Hand Pain    (Consider location/radiation/quality/duration/timing/severity/associated sxs/prior treatment) HPI This is a 51 year old black male who was seen in the ED about 5 days ago for gastroenteritis. He states that at that time he felt he had some swollen lymph nodes in his neck and left groin. Yesterday he developed swelling, pain and erythema in the left hand and wrist. He describes the symptoms as moderate to severe, worse with outpatient. He denies any injury to that hand. He denies fevers or chills. He continues to feel like his lymph nodes are swollen. He states he is also felt lightheaded when standing to urinate and has felt confused at times.  History reviewed. No pertinent past medical history.  Past Surgical History  Procedure Date  . Back surgery     No family history on file.  History  Substance Use Topics  . Smoking status: Never Smoker   . Smokeless tobacco: Never Used  . Alcohol Use: Yes      Review of Systems  All other systems reviewed and are negative.    Allergies  Review of patient's allergies indicates no known allergies.  Home Medications   Current Outpatient Rx  Name Route Sig Dispense Refill  . ACETAMINOPHEN 325 MG PO TABS Oral Take 650 mg by mouth every 6 (six) hours as needed. pain    . GOODYS BODY PAIN PO Oral Take 1 Package by mouth daily as needed. For pain    . NAPROXEN SODIUM 220 MG PO TABS Oral Take 440 mg by mouth daily as needed. For pain    . ONDANSETRON 4 MG PO TBDP Oral Take 1 tablet (4 mg total) by mouth every 6 (six) hours as needed for nausea. 6 tablet 0  . OXYCODONE-ACETAMINOPHEN 5-325 MG PO TABS Oral Take 1 tablet by mouth every 6 (six) hours as needed for pain. 30 tablet 0    BP 112/71  Pulse 113  Temp(Src) 97.5 F (36.4 C) (Oral)  Resp 16  SpO2  96%  Physical Exam General: Well-developed, well-nourished male in no acute distress; appearance consistent with age of record HENT: normocephalic, atraumatic Eyes: pupils equal round and reactive to light; extraocular muscles intact Neck: supple Heart: regular rate and rhythm; tachycardic Lungs: clear to auscultation bilaterally Abdomen: soft; nondistended Extremities: Edema, erythema and tenderness of left wrist and hand with decreased range of motion due to pain and swelling; other extremities unremarkable Lymphatic: No cervical or inguinal lymphadenopathy appreciated on exam Neurologic: Awake, alert and oriented; motor function intact in all extremities and symmetric; no facial droop Skin: Warm and dry Psychiatric: Flat affect    ED Course  Procedures (including critical care time)     MDM   Nursing notes and vitals signs, including pulse oximetry, reviewed.  Summary of this visit's results, reviewed by myself:  Labs:  Results for orders placed during the hospital encounter of 04/12/12  CBC      Component Value Range   WBC 17.3 (*) 4.0 - 10.5 (K/uL)   RBC 5.51  4.22 - 5.81 (MIL/uL)   Hemoglobin 12.7 (*) 13.0 - 17.0 (g/dL)   HCT 14.7 (*) 82.9 - 52.0 (%)   MCV 66.4 (*) 78.0 - 100.0 (fL)   MCH 23.0 (*) 26.0 - 34.0 (pg)   MCHC 34.7  30.0 -  36.0 (g/dL)   RDW 11.9  14.7 - 82.9 (%)   Platelets 206  150 - 400 (K/uL)  DIFFERENTIAL      Component Value Range   Neutrophils Relative 86 (*) 43 - 77 (%)   Lymphocytes Relative 4 (*) 12 - 46 (%)   Monocytes Relative 10  3 - 12 (%)   Eosinophils Relative 0  0 - 5 (%)   Basophils Relative 0  0 - 1 (%)   Neutro Abs 14.9 (*) 1.7 - 7.7 (K/uL)   Lymphs Abs 0.7  0.7 - 4.0 (K/uL)   Monocytes Absolute 1.7 (*) 0.1 - 1.0 (K/uL)   Eosinophils Absolute 0.0  0.0 - 0.7 (K/uL)   Basophils Absolute 0.0  0.0 - 0.1 (K/uL)   RBC Morphology TARGET CELLS     WBC Morphology TOXIC GRANULATION    BASIC METABOLIC PANEL      Component Value Range     Sodium 124 (*) 135 - 145 (mEq/L)   Potassium 3.9  3.5 - 5.1 (mEq/L)   Chloride 83 (*) 96 - 112 (mEq/L)   CO2 28  19 - 32 (mEq/L)   Glucose, Bld 117 (*) 70 - 99 (mg/dL)   BUN 23  6 - 23 (mg/dL)   Creatinine, Ser 5.62 (*) 0.50 - 1.35 (mg/dL)   Calcium 9.4  8.4 - 13.0 (mg/dL)   GFR calc non Af Amer 57 (*) >90 (mL/min)   GFR calc Af Amer 66 (*) >90 (mL/min)    Imaging Studies: Ct Hand Left W Contrast  04/13/2012  *RADIOLOGY REPORT*  Clinical Data: Pain, swelling, and erythema of the left forearm and hand.  CT OF THE LEFT HAND WITH CONTRAST  Technique:  Multidetector CT imaging was performed following the standard protocol during bolus administration of intravenous contrast.  Contrast: OMNIPAQUE IOHEXOL 300 MG/ML  SOLN  Comparison: Plain radiographs 04/12/2012  Findings: Diffuse low attenuation change throughout the intramuscular planes and subcutaneous soft tissues of the distal forearm and wrist.  Not loculated appearing fluid collection deep to the distal flexor or muscle and tendon compartment.  Changes are likely represent cellulitis, likely with tenosynovitis and edema. No discrete abscess cavity is demonstrated although focal abscess may be obscured.  MRI is suggested for more complete evaluation. Visualized bones appear intact.  There is no obvious fracture or bone lesion.  No cortical erosion or periosteal reaction to suggest osteomyelitis.  Degenerative changes.  IMPRESSION: Diffuse edema in the soft tissues and intramuscular planes of the distal forearm and wrist.  Volar fluid collection at the distal flexor tendons and muscles likely represents diffuse edema.  No definite localized abscess is demonstrated.  Consider MRI for better evaluation of these changes.  Original Report Authenticated By: Marlon Pel, M.D.   Dg Hand Complete Left  04/12/2012  *RADIOLOGY REPORT*  Clinical Data: Left hand pain, swelling, and tenderness.  No known injury.  LEFT HAND - COMPLETE 3+ VIEW   Comparison: None.  Findings: Diffuse soft tissue swelling.  No radiopaque foreign bodies or gas collections in the subcutaneous tissues.  Bones appear intact without evidence of acute fracture or subluxation. Benign-appearing sclerosis in the distal phalanx of the fourth finger.  No destructive bone lesions or expansile lesions demonstrated.  Degenerative changes in the STT, first metacarpal phalangeal, and multiple interphalangeal joints.  No abnormal periosteal reaction.  Bone cortex and trabecular architecture appear intact.  IMPRESSION: Diffuse soft tissue swelling.  No acute bony abnormalities demonstrated.  Degenerative changes.  Benign-appearing bone  sclerosis in the distal phalanx of the fourth finger.  Original Report Authenticated By: Marlon Pel, M.D.   4:45 AM Vancomycin 1 g IV ordered discussed with Dr. Zonia Kief who does not see an obvious abscess but is concern for tenosynovitis. D/W Dr. Melvyn Novas who will see patient in the CDU.           Hanley Seamen, MD 04/13/12 [phone]

## 2012-04-13 NOTE — Progress Notes (Signed)
Received critical lab on 5525, [place], 2 blood cultures drawn at or around 1am 04/13/12 were gram + cocci chains in all four bottles.  MD notified, patient already on vanc, no new orders given.  Will continue to monitor.  Macarthur Critchley, RN

## 2012-04-13 NOTE — Progress Notes (Signed)
04/13/12 1257  OTHER  CSW Follow Up Status No follow-up needed (Consult Unit Based LCSW if needs arise)

## 2012-04-13 NOTE — Progress Notes (Signed)
PCP:   Sheila Oats, MD, MD -no regular docotr  Chief Complaint:  LUE swelling and pain  Came here Mndnay and had a stomach infection-thought he had a case of Viral Diarrhoea.  Given pain meds and nausea meds.  Hand got swollen a couple of days ago, first starte to hurt between Tuesday and Thursday and swollen on Firday.  No accidents, not a diabetic.  Doesn't bite his nails, No boils or abcesses anywhere.  No splinter from anywhere.   Couldn;t walk as he had some swelling in his lower Inguinal areas.  Never has had this before. No bites to his hand no recent injury.    Diarrhoea is gone, no cp, sob, has had poor PO intake sinc eMOnday. poor  Appetite as well. More sleepy than usual, no headahes, nop copugh or cold, no other sick contacts,, No blood in stool or in vomit.  Last vomit was maybe 2-3 days ago.  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 Back surgery     Medications: Prior to Admission medications   Medication Sig Start Date End Date Taking? Authorizing Provider  acetaminophen (TYLENOL) 325 MG tablet Take 650 mg by mouth every 6 (six) hours as needed. pain   Yes Historical Provider, MD  Aspirin-Acetaminophen (GOODYS BODY PAIN PO) Take 1 Package by mouth daily as needed. For pain   Yes Historical Provider, MD  naproxen sodium (ANAPROX) 220 MG tablet Take 440 mg by mouth daily as needed. For pain   Yes Historical Provider, MD  ondansetron (ZOFRAN ODT) 4 MG disintegrating tablet Take 1 tablet (4 mg total) by mouth every 6 (six) hours as needed for nausea. 04/08/12 04/15/12 Yes Lisette Paz, PA-C  oxyCODONE-acetaminophen (PERCOCET) 5-325 MG per tablet Take 1 tablet by mouth every  6 (six) hours as needed for pain. 04/08/12 04/18/12 Yes Lisette Paz, PA-C    Allergies:  No Known Allergies  Social History:  reports that he has never smoked. He has never used smokeless tobacco. He reports that he drinks alcohol. He reports that he does not use illicit drugs.  Family History: No family history on file.  Physical Exam: Filed Vitals:   04/13/12 0732 04/13/12 0833 04/13/12 0925 04/13/12 1104  BP: 119/76 116/73  104/69  Pulse: 112 118  114  Temp: 100.1 F (37.8 C)  99 F (37.2 C)   TempSrc: Oral  Oral   Resp: 18 20  18   SpO2: 98% 94%  96%    HEENT-Arousable but sleepy.  Can converse but seems to jumble his words a little CHEST-clinically clear with no added sound CARDS-S1, S2 no murmur or gallop ABD-soft nontender nondistended. Moderate sized lymphadenopathy in bilateral inguinal areas SKIN-diffuse warm and red this to the right upper extremity very painful to touch patient has pain on extension and volar flexion of the arm.  Area marked count appears to be receding slightly NEURO-alert, oriented x3 memory: intact grossly cranial nerves II-XII: intact motor strength: full proximally and distally sensation: intact to vibration, pain, and light touch   Labs on Admission:   [facility] 04/12/12 2311  NA 124*  K 3.9  CL 83*  CO2 28  GLUCOSE 117*  BUN 23  CREATININE 1.38*  CALCIUM 9.4  MG --  PHOS --   No results found for this basename: AST:2,ALT:2,ALKPHOS:2,BILITOT:2,PROT:2,ALBUMIN:2 in the last 72 hours No results found for this basename: LIPASE:2,AMYLASE:2 in the last 72 hours  Basename 04/12/12 2311  WBC 17.3*  NEUTROABS 14.9*  HGB 12.7*  HCT 36.6*  MCV 66.4*  PLT 206   No results found for this basename: CKTOTAL:3,CKMB:3,CKMBINDEX:3,TROPONINI:3 in the last 72 hours No results found for this basename: TSH,T4TOTAL,FREET3,T3FREE,THYROIDAB in the last 72 hours No results found for this basename:  VITAMINB12:2,FOLATE:2,FERRITIN:2,TIBC:2,IRON:2,RETICCTPCT:2 in the last 72 hours  Radiological Exams on Admission: Dg Chest 2 View  04/08/2012  *RADIOLOGY REPORT*  Clinical Data: Left-sided pain  CHEST - 2 VIEW  Comparison: 11/28/2006  Findings: Heart size is normal.  Mediastinal shadows are normal. Lungs are clear.  No effusions.  No bony abnormalities.  IMPRESSION: Normal chest  Original Report Authenticated By: Thomasenia Sales, M.D.   Ct Hand Left W Contrast  04/13/2012  *RADIOLOGY REPORT*  Clinical Data: Pain, swelling, and erythema of the left forearm and hand.  CT OF THE LEFT HAND WITH CONTRAST  Technique:  Multidetector CT imaging was performed following the standard protocol during bolus administration of intravenous contrast.  Contrast: OMNIPAQUE IOHEXOL 300 MG/ML  SOLN  Comparison: Plain radiographs 04/12/2012  Findings: Diffuse low attenuation change throughout the intramuscular planes and subcutaneous soft tissues of the distal forearm and wrist.  Not loculated appearing fluid collection deep to the distal flexor or muscle and tendon compartment.  Changes are likely represent cellulitis, likely with tenosynovitis and edema. No discrete abscess cavity is demonstrated although focal abscess may be obscured.  MRI is suggested for more complete evaluation. Visualized bones appear intact.  There is no obvious fracture or bone lesion.  No cortical erosion or periosteal reaction to suggest osteomyelitis.  Degenerative changes.  IMPRESSION: Diffuse edema in the soft tissues and intramuscular planes of the distal forearm and wrist.  Volar fluid collection at the distal flexor tendons and muscles likely represents diffuse edema.  No definite localized abscess is demonstrated.  Consider MRI for better evaluation of these changes.  Original Report Authenticated By: Marlon Pel, M.D.   Dg Hand Complete Left  04/12/2012  *RADIOLOGY REPORT*  Clinical Data: Left hand pain, swelling, and tenderness.   No known injury.  LEFT HAND - COMPLETE 3+ VIEW  Comparison: None.  Findings: Diffuse soft tissue swelling.  No radiopaque foreign bodies or gas collections in the subcutaneous tissues.  Bones appear intact without evidence of acute fracture or subluxation. Benign-appearing sclerosis in the distal phalanx of the fourth finger.  No destructive bone lesions or expansile lesions demonstrated.  Degenerative changes in the STT, first metacarpal phalangeal, and multiple interphalangeal joints.  No abnormal periosteal reaction.  Bone cortex and trabecular architecture appear intact.  IMPRESSION: Diffuse soft tissue swelling.  No acute bony abnormalities demonstrated.  Degenerative changes.  Benign-appearing bone sclerosis in the distal phalanx of the fourth finger.  Original Report Authenticated By: Marlon Pel, M.D.    Assessment/Plan #1 Sepsis likely secondary to cellulitis-patient febrile tachycardic and mildly hypotensive. Responded however to IV fluids. Will follow blood cultures. Will discontinue moxifloxacin given vancomycin should cover his cellulitis. Will need telemetry monitoring for now-given his lymphadenopathy in the lower extremities and tenderness to touch under his arms and around his neck I am unsure if he is immunocompromised we'll get an HIV test.  Will continue IV fluids for 8 hours and reassess,  #2 cellulitis of left arm-no  inciting factor, this could also be dealt masquerading as this helps if that is less likely. He has recently had a viral gastroenteritis and if this was actually back to gastroenteritis that could be a potential source as to why he seeded to his left upper extremity however that is questionable. For now he will continue vancomycin per pharmacy. We are waiting for cultures to determine organism and will narrow antibiotics.  #3 hyponatremia-this is a new issue and on review of old records he had a normal sodium 2007.  This could be related to volume depletion from  decreased by mouth intake as well as from [place] stool output secondary to diarrhea and we will repeat him aggressively  #4 recent gastroenteritis-he is to be resolving  #5 possible diabetes mellitus-he has had blood sugars in the past 5 days above 100 random and this may represent impaired glucose tolerance this will be reassessed  Full cold > 60 min Admit to Tele, [facility]   Yareni Creps,JAI 04/13/2012, 11:26 AM

## 2012-04-13 NOTE — Progress Notes (Signed)
ANTIBIOTIC CONSULT NOTE - INITIAL  Pharmacy Consult for Vancomycin Indication: cellulitis  No Known Allergies  Patient Measurements: Height: 5\' 4"  (162.6 cm) Weight: 134 lb 7.7 oz (61 kg) IBW/kg (Calculated) : 59.2   Vital Signs: Temp: 98.1 F (36.7 C) (04/20 1403) Temp src: Oral (04/20 1403) BP: 126/80 mmHg (04/20 1403) Pulse Rate: 112  (04/20 1403) Intake/Output from previous day:   Intake/Output from this shift: Total I/O In: 1003 [I.V.:1003] Out: 200 [Urine:200]  Labs:  [facility] 04/12/12 2311  WBC 17.3*  HGB 12.7*  PLT 206  LABCREA --  CREATININE 1.38*   Estimated Creatinine Clearance: 52.4 ml/min (by C-G formula based on Cr of 1.38). No results found for this basename: VANCOTROUGH:2,VANCOPEAK:2,VANCORANDOM:2,GENTTROUGH:2,GENTPEAK:2,GENTRANDOM:2,TOBRATROUGH:2,TOBRAPEAK:2,TOBRARND:2,AMIKACINPEAK:2,AMIKACINTROU:2,AMIKACIN:2, in the last 72 hours   Microbiology: No results found for this or any previous visit (from the past 720 hour(s)).  Medical History: History reviewed. No pertinent past medical history.  Medications:  Scheduled:    . fentaNYL  50 mcg Intravenous Once  . LORazepam  1 mg Intravenous Once  . moxifloxacin  400 mg Intravenous Once  . sodium chloride  3 mL Intravenous Q12H  . vancomycin  1,000 mg Intravenous Once  . DISCONTD: vancomycin  1,000 mg Intravenous Q12H   Assessment: 51yo male with sepsis, cellulitis of L-hand/arm.  CT (-) for abscess.  He received Vanc 1000mg  IV in the ED ~5AM.  He was hypotensive on admit, but this improved with fluids.  I expect his Cr may improve also.  Goal of Therapy:  Vancomycin trough level 15-20 mcg/ml  Plan:  1.  Vancomycin 500mg  IV q12 2.  F/U cx results 3.  F/U am labs- adjust dosing as warranted 4.  Plan ss Vanc trough  Marisue Humble, PharmD Clinical Pharmacist [facility]

## 2012-04-13 NOTE — ED Provider Notes (Signed)
Sign out was given by Dr. Read Drivers.  Dr. Zella Ball saw the patient in the CDU.  Due to left hand having no abscess and only inflammation of left hand by CT scan, Dr. Zella Ball recommended admission by hospitalist.  Pt is showing improvement in his swelling.  He does continue to have left hand pain.  Sensation and pulses intact.  Continue IV abx of vancomycin and avelox.  Afebrile over the past 4 hours.  Consult hospitalist for admission.  They will admit and see pt in the ED.    [name], [place] 04/13/12 1130

## 2012-04-14 LAB — COMPREHENSIVE METABOLIC PANEL
Albumin: 1.9 g/dL — ABNORMAL LOW (ref 3.5–5.2)
BUN: 15 mg/dL (ref 6–23)
Calcium: 8.1 mg/dL — ABNORMAL LOW (ref 8.4–10.5)
Creatinine, Ser: 0.94 mg/dL (ref 0.50–1.35)
GFR calc Af Amer: >90 mL/min (ref >90)
Glucose, Bld: 151 mg/dL — ABNORMAL HIGH (ref 70–99)
Potassium: 3.1 mEq/L — ABNORMAL LOW (ref 3.5–5.1)
Total Protein: 6.9 g/dL (ref 6.0–8.3)

## 2012-04-14 LAB — CBC
HCT: 32.4 % — ABNORMAL LOW (ref 39.0–52.0)
Hemoglobin: 11.2 g/dL — ABNORMAL LOW (ref 13.0–17.0)
MCV: 65.2 fL — ABNORMAL LOW (ref 78.0–100.0)
RBC: 4.97 MIL/uL (ref 4.22–5.81)
RDW: 14.4 % (ref 11.5–15.5)
WBC: 21 10*3/uL — ABNORMAL HIGH (ref 4.0–10.5)

## 2012-04-14 LAB — GLUCOSE, CAPILLARY

## 2012-04-14 MED ORDER — CEFAZOLIN SODIUM 1-5 GM-% IV SOLN
1.0000 g | Freq: Three times a day (TID) | INTRAVENOUS | Status: DC
  Administered 2012-04-14 – 2012-04-17 (×9): 1 g via INTRAVENOUS
  Filled 2012-04-14 (×12): qty 50

## 2012-04-14 NOTE — Progress Notes (Signed)
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 Degenerative changes. bony sclerosis of distal phalanx fourth finger  CT scan 4/19 = diffuse edema of the soft tissues and intermuscular planes of the distal forearm and wrist. Volar fluid collection at the distal flexor tendons and muscles reflecting diffuse edema-no definite localized abscess demonstrated  Antibiotics:  Vancomycin 4/19-4/21  moxifloxacin 4/20-4/20  Ancef 4/21   Subjective  Is better, less confused. No diarrhea no vomiting no nausea. No chills Tolerating by mouth well. Pain still present in the left arm but decreased.  Objective   Interim History: Note cultures positive for Streptococcus A  Objective: Filed Vitals:   04/13/12 1339 04/13/12 1403 04/13/12 2100 04/14/12 0457  BP: 128/77 126/80 128/76 124/82  Pulse:  112 107 96  Temp:  98.1 F (36.7 C) 98.8 F (37.1 C) 98.6 F (37 C)  TempSrc:  Oral Oral Oral  Resp: 16 24 18 17   Height:  5\' 4"  (1.626 m)    Weight:  61 kg (134 lb 7.7 oz)    SpO2: 97% 95% 96% 96%    Intake/Output Summary (Last 24 hours) at 04/14/12 1316 Last data filed at 04/14/12 0900  Gross per 24 hour  Intake 3839.67 ml  Output   2025 ml  Net 1814.67 ml    Exam:  HEENT-Arousable.  More oriented CHEST-clinically clear with no added sound  CARDS-S1, S2 no murmur or gallop  ABD-soft nontender nondistended. Moderate sized lymphadenopathy in bilateral inguinal areas  SKIN-diffuse warm and red this to the right upper extremity less painful to touch patient has pain on extension and volar flexion of the arm. Area  marked count appears to be receding slightly  NEURO-alert, oriented x3  memory: intact grossly  cranial nerves II-XII: intact  motor strength: full proximally and distally  sensation: intact to vibration, pain, and light touch   Data Reviewed: Basic Metabolic Panel:  Lab 04/14/12 8119 04/12/12 2311 04/08/12 0724  NA 137 124* 127*  K 3.1* 3.9 --  CL 98 83* 90*  CO2 26 28 22   GLUCOSE 151* 117* 149*  BUN 15 23 9   CREATININE 0.94 1.38* 1.12  CALCIUM 8.1* 9.4 9.6  MG -- -- --  PHOS -- -- --   Liver Function Tests:  Lab 04/14/12 0650 04/08/12 0724  AST 101* 68*  ALT 42 28  ALKPHOS 142* 95  BILITOT 0.9 0.5  PROT 6.9 8.5*  ALBUMIN 1.9* 3.7   No results found for this basename: LIPASE:5,AMYLASE:5 in the last 168 hours No results found for this basename: AMMONIA:5 in the last 168 hours CBC:  Lab 04/14/12 0650 04/12/12 2311 04/08/12 0724  WBC 21.0* 17.3* 14.6*  NEUTROABS -- 14.9* 12.8*  HGB 11.2* 12.7* 14.6  HCT 32.4* 36.6* 41.5  MCV 65.2* 66.4* 68.9*  PLT 245 206 170   Cardiac Enzymes: No results found for this basename: CKTOTAL:5,CKMB:5,CKMBINDEX:5,TROPONINI:5 in the last 168 hours BNP: No components found with this basename: POCBNP:5 CBG:  Lab 04/14/12 0801 04/13/12 1324  GLUCAP 135* 90    Recent Results (from the past 240 hour(s))  CULTURE, BLOOD (ROUTINE X 2)     Status: Normal (Preliminary result)   Collection Time  04/13/12  1:28 AM      Component Value Range Status Comment   Specimen Description BLOOD RIGHT HAND   Final    Special Requests BOTTLES DRAWN AEROBIC AND ANAEROBIC [facility] EACH   Final    Culture  Setup Time [id number]   Final    Culture     Final    Value: GROUP A STREP (S.PYOGENES) ISOLATED     Note: CRITICAL RESULT CALLED TO, READ BACK BY AND VERIFIED WITH: RN D. CELANO ON 04/14/12 AT 1050 BY DTERRY     Note: Gram Stain Report Called to,Read Back By and Verified With: RN M. TROGDEN ON 04/13/12 AT 1930 BY DTERRY   Report Status PENDING    Incomplete   CULTURE, BLOOD (ROUTINE X 2)     Status: Normal (Preliminary result)   Collection Time   04/13/12  1:34 AM      Component Value Range Status Comment   Specimen Description BLOOD RIGHT ARM   Final    Special Requests BOTTLES DRAWN AEROBIC AND ANAEROBIC 10CC EACH   Final    Culture  Setup Time [id number]   Final    Culture     Final    Value: GROUP A STREP (S.PYOGENES) ISOLATED     Note: CRITICAL RESULT CALLED TO, READ BACK BY AND VERIFIED WITH: RN D. CELANO ON 04/14/12 AT 1050 BY DTERRY     Note: Gram Stain Report Called to,Read Back By and Verified With: RN M. TROGDEN ON 04/13/12 AT 1930 BY DTERRY   Report Status PENDING   Incomplete      Studies:              All Imaging reviewed and is as per above notation   Scheduled Meds:   .  ceFAZolin (ANCEF) IV  1 g Intravenous Q8H  . sodium chloride  3 mL Intravenous Q12H  . DISCONTD: vancomycin  500 mg Intravenous Q12H  . DISCONTD: vancomycin  1,000 mg Intravenous Q12H   Continuous Infusions:   . sodium chloride 200 mL (04/14/12 0918)  . DISCONTD: sodium chloride Stopped (04/13/12 0727)  . DISCONTD: sodium chloride 1,000 mL (04/13/12 0728)     Assessment/Plan: #1 Sepsis likely secondary to cellulitis from group A strep-improving. Change vancomycin to Ancef 4/21. CBC elevated today, recheck in a.m. and reassess the area. Decreased fluids to 100 cc per hour. #2 cellulitis of left arm-no inciting factor.  He has recently had a viral gastroenteritis and if this was actually back to gastroenteritis that could be a potential source as to why he seeded to his left upper extremity however that is questionable.   #3 hyponatremia-this is a new issue and on review of old records he had a normal sodium 2007. This could be related to volume depletion from decreased by mouth intake as well as from  stool output secondary to diarrhea.  Resolved. Lower IV fluid rate  #4 recent gastroenteritis-he is to be resolving  #5 possible diabetes  mellitus-he has had elevated blood sugars in the past 5 days above 100 random and this may represent impaired glucose tolerance this will be reassessed #6 elevated transaminases-repeat cmet in the morning. #7 acute kidney injury secondary to sepsis-creatinine trended down this is likely secondary to multiple reasons inclusive of dehydration and sepsis/antrum.  Code Status: Full Family Communication: None at bedside Disposition Plan: Home when afebrile and hand appears to have significant improvement   Pleas Koch, MD  [facility] Pager [phone] 04/14/2012, 1:16 PM  LOS: 2 days

## 2012-04-14 NOTE — Plan of Care (Signed)
Problem: Phase I Progression Outcomes Goal: Other Phase I Outcomes/Goals Outcome: Completed/Met Date Met:  04/14/12 Put lavender compresses on pt cellulitis starting at 0745 and reapplied 3 times during the day. Pt swelling on hand reduced and redness reduced

## 2012-04-14 NOTE — Progress Notes (Signed)
CRITICAL VALUE ALERT  Critical value received: 2 Blood cultures with group A strep Date of notification:  04/14/12  Time of notification: 1050 Critical value read back:yes  Nurse who received alert:  Peter Congo  MD notified (1st page): 1055 Time of first page: 1055 MD notified (2nd page):  Time of second page:  Responding MD: Mahala Menghini Time MD responded: 1055

## 2012-04-15 LAB — COMPREHENSIVE METABOLIC PANEL
ALT: 44 U/L (ref 0–53)
Albumin: 1.7 g/dL — ABNORMAL LOW (ref 3.5–5.2)
Alkaline Phosphatase: 209 U/L — ABNORMAL HIGH (ref 39–117)
BUN: 13 mg/dL (ref 6–23)
Chloride: 98 mEq/L (ref 96–112)
GFR calc Af Amer: >90 mL/min (ref >90)
Glucose, Bld: 133 mg/dL — ABNORMAL HIGH (ref 70–99)
Potassium: 3.1 mEq/L — ABNORMAL LOW (ref 3.5–5.1)
Sodium: 135 mEq/L (ref 135–145)
Total Bilirubin: 0.8 mg/dL (ref 0.3–1.2)
Total Protein: 6.4 g/dL (ref 6.0–8.3)

## 2012-04-15 LAB — CBC
HCT: 29.7 % — ABNORMAL LOW (ref 39.0–52.0)
Hemoglobin: 10.1 g/dL — ABNORMAL LOW (ref 13.0–17.0)
MCHC: 34 g/dL (ref 30.0–36.0)
WBC: 15 10*3/uL — ABNORMAL HIGH (ref 4.0–10.5)

## 2012-04-15 MED ORDER — POTASSIUM CHLORIDE CRYS ER 20 MEQ PO TBCR
40.0000 meq | EXTENDED_RELEASE_TABLET | Freq: Every day | ORAL | Status: DC
  Administered 2012-04-15 – 2012-04-21 (×6): 40 meq via ORAL
  Filled 2012-04-15 (×9): qty 2

## 2012-04-15 NOTE — Progress Notes (Signed)
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 Degenerative changes. bony sclerosis of distal phalanx fourth finger  CT scan 4/19 = diffuse edema of the soft tissues and intermuscular planes of the distal forearm and wrist. Volar fluid collection at the distal flexor tendons and muscles reflecting diffuse edema-no definite localized abscess demonstrated  Antibiotics:  Vancomycin 4/19-4/21  moxifloxacin 4/20-4/20  Ancef 4/21   Subjective  Is better No diarrhea no vomiting no nausea. No chills Tolerating by mouth well. Pain still present in the left arm and requesting pain medication  Objective   Interim History: Note cultures positive for Streptococcus A  Objective: Filed Vitals:   04/14/12 1430 04/14/12 2146 04/15/12 0515 04/15/12 1527  BP: 126/83 116/76 124/76 138/94  Pulse: 100 89 86 85  Temp: 99.5 F (37.5 C) 100.4 F (38 C) 98.2 F (36.8 C) 98.9 F (37.2 C)  TempSrc: Oral Oral Oral Oral  Resp: 18 17 18 18   Height:      Weight:      SpO2: 95% 97% 95% 97%    Intake/Output Summary (Last 24 hours) at 04/15/12 1737 Last data filed at 04/15/12 1528  Gross per 24 hour  Intake   2488 ml  Output   1400 ml  Net   1088 ml    Exam:  HEENT-Arousable.  More oriented CHEST-clinically clear with no added sound  CARDS-S1, S2 no murmur or gallop  ABD-soft nontender nondistended. Moderate sized lymphadenopathy in bilateral inguinal areas  SKIN-diffuse warm and red this to the right upper extremity less painful to touch patient has pain on extension and volar flexion of the arm. Area marked count  appears to be receding slightly  NEURO-alert, oriented x3  memory: intact grossly  cranial nerves II-XII: intact  motor strength: full proximally and distally  sensation: intact to vibration, pain, and light touch   Data Reviewed: Basic Metabolic Panel:  Lab 04/15/12 8119 04/14/12 0650 04/12/12 2311  NA 135 137 124*  K 3.1* 3.1* --  CL 98 98 83*  CO2 26 26 28   GLUCOSE 133* 151* 117*  BUN 13 15 23   CREATININE 0.88 0.94 1.38*  CALCIUM 7.7* 8.1* 9.4  MG -- -- --  PHOS -- -- --   Liver Function Tests:  Lab 04/15/12 0555 04/14/12 0650  AST 108* 101*  ALT 44 42  ALKPHOS 209* 142*  BILITOT 0.8 0.9  PROT 6.4 6.9  ALBUMIN 1.7* 1.9*   No results found for this basename: LIPASE:5,AMYLASE:5 in the last 168 hours No results found for this basename: AMMONIA:5 in the last 168 hours CBC:  Lab 04/15/12 0555 04/14/12 0650 04/12/12 2311  WBC 15.0* 21.0* 17.3*  NEUTROABS -- -- 14.9*  HGB 10.1* 11.2* 12.7*  HCT 29.7* 32.4* 36.6*  MCV 65.3* 65.2* 66.4*  PLT 232 245 206   Cardiac Enzymes: No results found for this basename: CKTOTAL:5,CKMB:5,CKMBINDEX:5,TROPONINI:5 in the last 168 hours BNP: No components found with this basename: POCBNP:5 CBG:  Lab 04/15/12 0802 04/14/12 0801 04/13/12 1324  GLUCAP 128* 135* 90    Recent Results (from the past 240 hour(s))  CULTURE, BLOOD (ROUTINE X 2)     Status: Normal (Preliminary result)  Collection Time   04/13/12  1:28 AM      Component Value Range Status Comment   Specimen Description BLOOD RIGHT HAND   Final    Special Requests BOTTLES DRAWN AEROBIC AND ANAEROBIC [facility] EACH   Final    Culture  Setup Time [id number]   Final    Culture     Final    Value: GROUP A STREP (S.PYOGENES) ISOLATED     Note: CRITICAL RESULT CALLED TO, READ BACK BY AND VERIFIED WITH: RN D. CELANO ON 04/14/12 AT 1050 BY DTERRY     Note: Gram Stain Report Called to,Read Back By and Verified With: RN M. TROGDEN ON 04/13/12 AT 1930 BY DTERRY   Report Status PENDING    Incomplete   CULTURE, BLOOD (ROUTINE X 2)     Status: Normal (Preliminary result)   Collection Time   04/13/12  1:34 AM      Component Value Range Status Comment   Specimen Description BLOOD RIGHT ARM   Final    Special Requests BOTTLES DRAWN AEROBIC AND ANAEROBIC 10CC EACH   Final    Culture  Setup Time [id number]   Final    Culture     Final    Value: GROUP A STREP (S.PYOGENES) ISOLATED     Note: CRITICAL RESULT CALLED TO, READ BACK BY AND VERIFIED WITH: RN D. CELANO ON 04/14/12 AT 1050 BY DTERRY     Note: Gram Stain Report Called to,Read Back By and Verified With: RN M. TROGDEN ON 04/13/12 AT 1930 BY DTERRY   Report Status PENDING   Incomplete      Studies:              All Imaging reviewed and is as per above notation   Scheduled Meds:    .  ceFAZolin (ANCEF) IV  1 g Intravenous Q8H  . sodium chloride  3 mL Intravenous Q12H   Continuous Infusions:    . sodium chloride 1,000 mL (04/15/12 1143)     Assessment/Plan: #1 Sepsis likely secondary to cellulitis from group A strep-improving. Change vancomycin to Ancef 4/21. CBC dropped from 17.3-->21-- >15 to , recheck in a.m. and reassess the area. Decreased fluids to 50 cc per hour. Change antibiotics to by mouth after maybe 2-3 days #2 cellulitis of left arm-no inciting factor.  He has recently had a viral gastroenteritis and if this was actually back to gastroenteritis that could be a potential source as to why he seeded to his left upper extremity however that is questionable.   #3 hyponatremia-this is a new issue and on review of old records he had a normal sodium 2007. This could be related to volume depletion from decreased by mouth intake as well as from  stool output secondary to diarrhea.  Resolved. Lower IV fluid rate to 50 cc an hour #4 recent gastroenteritis-he is to be resolving  #5 possible diabetes mellitus. #6 elevated transaminases-repeat cmet in the morning. #7 acute kidney injury secondary to sepsis-creatinine  trended down this is likely secondary to multiple reasons inclusive of dehydration #8 hypokalemia-replace with by mouth supplementation 40 mEq potassium to #9 anemia drop in hemoglobin from 12.7 on admission to 10.1. Repeat CBC in a.m. and add iron studies.  Code Status: Full Family Communication: None at bedside Disposition Plan: Home when afebrile and hand appears to have significant improvement   Pleas Koch, MD  [facility] Pager [phone] 04/15/2012, 5:37 PM    LOS: 3 days

## 2012-04-15 NOTE — Progress Notes (Signed)
INITIAL ADULT NUTRITION ASSESSMENT Date: 04/15/2012   Time: 3:17 PM Reason for Assessment: Screened for nutrition risk for unintentional weight loss  ASSESSMENT: Male 51 y.o.  Dx: Sepsis associated hypotension  Hx: History reviewed. No pertinent past medical history.  Related Meds:  Scheduled Meds:   .  ceFAZolin (ANCEF) IV  1 g Intravenous Q8H  . sodium chloride  3 mL Intravenous Q12H   Continuous Infusions:   . sodium chloride 1,000 mL (04/15/12 1143)   PRN Meds:.acetaminophen, fentaNYL, morphine, ondansetron (ZOFRAN) IV, ondansetron   Ht: 5\' 4"  (162.6 cm)  Wt: 134 lb 7.7 oz (61 kg)  Ideal Wt: 64.54 kg % Ideal Wt: 94.3%  Usual Wt: 138 lb. % Usual Wt: 97.1%  Wt Readings from Last 3 Encounters:  04/13/12 134 lb 7.7 oz (61 kg)  11/22/11 137 lb (62.143 kg)    Body mass index is 23.08 kg/(m^2). (WNL)  Food/Nutrition Related Hx: Patient reports appetite has been pretty good. PO intake documented 75% at meals. Noted patient with left upper arm cellulitis. Patient denies any problems with nausea or vomiting.   Labs:  CMP     Component Value Date/Time   NA 135 04/15/2012 0555   K 3.1* 04/15/2012 0555   CL 98 04/15/2012 0555   CO2 26 04/15/2012 0555   GLUCOSE 133* 04/15/2012 0555   BUN 13 04/15/2012 0555   CREATININE 0.88 04/15/2012 0555   CALCIUM 7.7* 04/15/2012 0555   PROT 6.4 04/15/2012 0555   ALBUMIN 1.7* 04/15/2012 0555   AST 108* 04/15/2012 0555   ALT 44 04/15/2012 0555   ALKPHOS 209* 04/15/2012 0555   BILITOT 0.8 04/15/2012 0555   GFRNONAA >90 04/15/2012 0555   GFRAA >90 04/15/2012 0555    Intake/Output Summary (Last 24 hours) at 04/15/12 1524 Last data filed at 04/15/12 0906  Gross per 24 hour  Intake   2228 ml  Output   1600 ml  Net    628 ml     Diet Order: General  Supplements/Tube Feeding: none at this time  IVF:    sodium chloride Last Rate: 1,000 mL (04/15/12 1143)    Estimated Nutritional Needs:   Kcal: 1610-9604 Protein: 76-91 grams Fluid:  1 ml per kcal  NUTRITION DIAGNOSIS: -Increased nutrient needs (NI-5.1).  Status: Ongoing  RELATED TO: wound healing  AS EVIDENCE BY: left upper arm cellulitis  MONITORING/EVALUATION(Goals): PO intake, weight trends, labs, skin 1. PO intake > 75% at meals and snacks.  EDUCATION NEEDS: -No education needs identified at this time  INTERVENTION: 1. Will order patient high protein snacks BID. 2. RD to follow for nutrition plan of care.   Dietitian #: [phone]  DOCUMENTATION CODES Per approved criteria  -Not Applicable    Iven Finn [facility] 04/15/2012, 3:17 PM

## 2012-04-16 LAB — HEPATIC FUNCTION PANEL
ALT: 45 U/L (ref 0–53)
Albumin: 1.6 g/dL — ABNORMAL LOW (ref 3.5–5.2)
Alkaline Phosphatase: 191 U/L — ABNORMAL HIGH (ref 39–117)
Total Protein: 6.9 g/dL (ref 6.0–8.3)

## 2012-04-16 LAB — CBC
MCH: 22.4 pg — ABNORMAL LOW (ref 26.0–34.0)
MCV: 65.5 fL — ABNORMAL LOW (ref 78.0–100.0)
Platelets: 283 10*3/uL (ref 150–400)
RDW: 14.7 % (ref 11.5–15.5)

## 2012-04-16 NOTE — Progress Notes (Signed)
Clinical Social Work Department BRIEF PSYCHOSOCIAL ASSESSMENT 04/16/2012  Patient:  [name], [name]     Account Number:  [id number]     Admit date:  04/12/2012  Clinical Social Worker:  Hulan Fray  Date/Time:  04/16/2012 04:56 PM  Referred by:  RN  Date Referred:  04/13/2012 Referred for  Other - See comment   Other Referral:   Problems obtaining medications   Interview type:  Other - See comment Other interview type:   Case Management    PSYCHOSOCIAL DATA Living Status:  WIFE Admitted from facility:   Level of care:   Primary support name:  Ernestine Primary support relationship to patient:  SPOUSE Degree of support available:   adequate    CURRENT CONCERNS Current Concerns  None Noted   Other Concerns:    SOCIAL WORK ASSESSMENT / PLAN Clinical Social Worker received inappropriate referral for medication assistance. Case Manager Victorino Dike is already assisting patient with this concern. CSW will sign off as social work intervention is no longer needed.   Assessment/plan status:   Other assessment/ plan:   Information/referral to community resources:    PATIENT'S/FAMILY'S RESPONSE TO PLAN OF CARE:

## 2012-04-16 NOTE — Progress Notes (Signed)
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 Degenerative changes. bony sclerosis of distal phalanx fourth finger  CT scan 4/19 = diffuse edema of the soft tissues and intermuscular planes of the distal forearm and wrist. Volar fluid collection at the distal flexor tendons and muscles reflecting diffuse edema-no definite localized abscess demonstrated  Antibiotics:  Vancomycin 4/19-4/21  moxifloxacin 4/20-4/20  Ancef 4/21   Subjective  Pain still present left hand No diarrhea no vomiting no nausea. No chills Tolerating by mouth well. Pain still present in the left arm and requesting pain medication    Objective   Interim History: Note cultures positive for Streptococcus A  Objective: Filed Vitals:   04/15/12 0515 04/15/12 1527 04/15/12 2207 04/16/12 0544  BP: 124/76 138/94 137/89 137/87  Pulse: 86 85 92 87  Temp: 98.2 F (36.8 C) 98.9 F (37.2 C) 98.6 F (37 C) 99 F (37.2 C)  TempSrc: Oral Oral Oral Oral  Resp: 18 18 20 20   Height:      Weight:      SpO2: 95% 97% 98% 94%    Intake/Output Summary (Last 24 hours) at 04/16/12 1422 Last data filed at 04/16/12 1021  Gross per 24 hour  Intake 3973.33 ml  Output   1300 ml  Net 2673.33 ml    Exam:  HEENT-Arousable.  More oriented CHEST-clinically clear with no added sound  CARDS-S1, S2 no murmur or gallop  ABD-soft nontender nondistended. Moderate sized lymphadenopathy in bilateral inguinal areas  SKIN-diffuse warm and red this to the right upper extremity less painful to touch patient has pain on extension and volar flexion of the arm.  Area marked-out appears to be receding slightly  NEURO-alert, oriented x3  memory: intact grossly  cranial nerves II-XII: intact  motor strength: full proximally and distally  sensation: intact to vibration, pain, and light touch   Data Reviewed: Basic Metabolic Panel:  Lab 04/15/12 8119 04/14/12 0650 04/12/12 2311  NA 135 137 124*  K 3.1* 3.1* --  CL 98 98 83*  CO2 26 26 28   GLUCOSE 133* 151* 117*  BUN 13 15 23   CREATININE 0.88 0.94 1.38*  CALCIUM 7.7* 8.1* 9.4  MG -- -- --  PHOS -- -- --   Liver Function Tests:  Lab 04/15/12 0555 04/14/12 0650  AST 108* 101*  ALT 44 42  ALKPHOS 209* 142*  BILITOT 0.8 0.9  PROT 6.4 6.9  ALBUMIN 1.7* 1.9*   No results found for this basename: LIPASE:5,AMYLASE:5 in the last 168 hours No results found for this basename: AMMONIA:5 in the last 168 hours CBC:  Lab 04/16/12 0547 04/15/12 0555 04/14/12 0650 04/12/12 2311  WBC 15.5* 15.0* 21.0* 17.3*  NEUTROABS -- -- -- 14.9*  HGB 10.6* 10.1* 11.2* 12.7*  HCT 31.0* 29.7* 32.4* 36.6*  MCV 65.5* 65.3* 65.2* 66.4*  PLT 283 232 245 206   Cardiac Enzymes: No results found for this basename: CKTOTAL:5,CKMB:5,CKMBINDEX:5,TROPONINI:5 in the last 168 hours BNP: No components found with this basename: POCBNP:5 CBG:  Lab 04/16/12 0810 04/15/12 0802 04/14/12 0801 04/13/12 1324  GLUCAP 117* 128* 135* 90    Recent Results (from the past 240 hour(s))  CULTURE, BLOOD (ROUTINE X  2)     Status: Normal (Preliminary result)   Collection Time   04/13/12  1:28 AM      Component Value Range Status Comment   Specimen Description BLOOD RIGHT HAND   Final    Special Requests BOTTLES DRAWN AEROBIC AND ANAEROBIC [facility] EACH   Final    Culture  Setup Time [id number]   Final    Culture     Final    Value: GROUP A STREP (S.PYOGENES) ISOLATED     Note: CRITICAL RESULT CALLED TO, READ BACK BY AND VERIFIED WITH: RN D. CELANO ON 04/14/12 AT 1050 BY DTERRY     Note: Gram Stain Report Called to,Read Back By and  Verified With: RN M. TROGDEN ON 04/13/12 AT 1930 BY DTERRY   Report Status PENDING   Incomplete   CULTURE, BLOOD (ROUTINE X 2)     Status: Normal (Preliminary result)   Collection Time   04/13/12  1:34 AM      Component Value Range Status Comment   Specimen Description BLOOD RIGHT ARM   Final    Special Requests BOTTLES DRAWN AEROBIC AND ANAEROBIC 10CC EACH   Final    Culture  Setup Time [id number]   Final    Culture     Final    Value: GROUP A STREP (S.PYOGENES) ISOLATED     Note: CRITICAL RESULT CALLED TO, READ BACK BY AND VERIFIED WITH: RN D. CELANO ON 04/14/12 AT 1050 BY DTERRY     Note: Gram Stain Report Called to,Read Back By and Verified With: RN M. TROGDEN ON 04/13/12 AT 1930 BY DTERRY   Report Status PENDING   Incomplete      Studies:              All Imaging reviewed and is as per above notation   Scheduled Meds:    .  ceFAZolin (ANCEF) IV  1 g Intravenous Q8H  . potassium chloride  40 mEq Oral Daily  . sodium chloride  3 mL Intravenous Q12H   Continuous Infusions:    . sodium chloride 50 mL/hr at 04/16/12 2130     Assessment/Plan: #1 Sepsis likely secondary to cellulitis from group A strep-improving. Change vancomycin to Ancef 4/21. CBC dropped from 17.3-->21-->15---15.5.  Decreased fluids to 50 cc per hour. Change antibiotics to by mouth after maybe 2-3 days.  Need sensitivities prior to d/c.  #2 cellulitis of left arm-no inciting factor.  He has recently had a viral gastroenteritis and if this was actually back to gastroenteritis that could be a potential source as to why he seeded to his left upper extremity however that is questionable.    #3 hyponatremia-this is a new issue and on review of old records he had a normal sodium 2007. Resolved c fluid repletion. Lower IV fluid rate to 50 cc an hour  #4 recent gastroenteritis-he is to be resolving   #5 possible diabetes mellitus . Aic was 6.1.  Would not cover with insulin out-patient, but as acutely ill, would  cover in hospital.  #6 elevated transaminases-repeat cmet in the morning-this could be sepsis vs recent diarrhoea related, but might need to rule out cholelithiasis, although patient without any abdominal pain currently.  Will order rpt LFT's and if progrssvely hgier than prior, will ge tUS liver   #7 acute kidney injury secondary to sepsis-creatinine trended down this is likely secondary to multiple reasons inclusive of dehydration  #8 hypokalemia-replace with by mouth supplementation 40 mEq potassium to  #9  anemia drop in hemoglobin from 12.7 on admission to 10.1. Repeat CBC in a.m.-Likely Anemia of Acute/Chronic disease.   Ferritin unreliable given acute phase reactant.  Code Status: Full Family Communication: None at bedside Disposition Plan: Home when afebrile and hand appears to have significant improvement   Pleas Koch, MD  [facility] Pager [phone] 04/16/2012, 2:22 PM    LOS: 4 days

## 2012-04-16 NOTE — Progress Notes (Signed)
Utilization Review Completed.[name] T4/23/2013   

## 2012-04-17 ENCOUNTER — Encounter (HOSPITAL_COMMUNITY): Payer: Self-pay | Admitting: Anesthesiology

## 2012-04-17 ENCOUNTER — Inpatient Hospital Stay (HOSPITAL_COMMUNITY): Payer: Medicaid Other

## 2012-04-17 ENCOUNTER — Encounter (HOSPITAL_COMMUNITY)
Admission: EM | Disposition: A | Discharge status: Home / Self Care | Payer: Self-pay | Source: Home / Self Care | Attending: Internal Medicine

## 2012-04-17 ENCOUNTER — Encounter (HOSPITAL_COMMUNITY): Payer: Self-pay | Admitting: Radiology

## 2012-04-17 ENCOUNTER — Inpatient Hospital Stay (HOSPITAL_COMMUNITY): Payer: Medicaid Other | Admitting: Anesthesiology

## 2012-04-17 DIAGNOSIS — A419 Sepsis, unspecified organism: Secondary | ICD-10-CM

## 2012-04-17 DIAGNOSIS — L02219 Cutaneous abscess of trunk, unspecified: Secondary | ICD-10-CM

## 2012-04-17 DIAGNOSIS — L03221 Cellulitis of neck: Secondary | ICD-10-CM

## 2012-04-17 DIAGNOSIS — L03319 Cellulitis of trunk, unspecified: Secondary | ICD-10-CM

## 2012-04-17 DIAGNOSIS — L0211 Cutaneous abscess of neck: Secondary | ICD-10-CM

## 2012-04-17 HISTORY — PX: DIRECT LARYNGOSCOPY: SHX5326

## 2012-04-17 HISTORY — PX: RIGID ESOPHAGOSCOPY: SHX5226

## 2012-04-17 HISTORY — PX: RADICAL NECK DISSECTION: SHX2284

## 2012-04-17 LAB — TYPE AND SCREEN: Antibody Screen: NEGATIVE

## 2012-04-17 LAB — DIFFERENTIAL
Basophils Absolute: 0 10*3/uL (ref 0.0–0.1)
Eosinophils Absolute: 0.1 10*3/uL (ref 0.0–0.7)
Lymphs Abs: 1.8 10*3/uL (ref 0.7–4.0)
Monocytes Relative: 12 % (ref 3–12)
Neutro Abs: 10.3 10*3/uL — ABNORMAL HIGH (ref 1.7–7.7)

## 2012-04-17 LAB — CBC
HCT: 33.4 % — ABNORMAL LOW (ref 39.0–52.0)
MCHC: 33.5 g/dL (ref 30.0–36.0)
MCV: 67.1 fL — ABNORMAL LOW (ref 78.0–100.0)
RDW: 15.3 % (ref 11.5–15.5)

## 2012-04-17 LAB — SURGICAL PCR SCREEN: Staphylococcus aureus: NEGATIVE

## 2012-04-17 LAB — PROTIME-INR: Prothrombin Time: 15.3 seconds — ABNORMAL HIGH (ref 11.6–15.2)

## 2012-04-17 SURGERY — DISSECTION, NECK, RADICAL
Anesthesia: General | Site: Neck | Laterality: Right | Wound class: Dirty or Infected

## 2012-04-17 MED ORDER — LIDOCAINE HCL 4 % MT SOLN
OROMUCOSAL | Status: DC | PRN
  Administered 2012-04-17: 4 mL via TOPICAL

## 2012-04-17 MED ORDER — LIDOCAINE HCL (CARDIAC) 20 MG/ML IV SOLN
INTRAVENOUS | Status: DC | PRN
  Administered 2012-04-17: 100 mg via INTRAVENOUS

## 2012-04-17 MED ORDER — ONDANSETRON HCL 4 MG/2ML IJ SOLN
INTRAMUSCULAR | Status: DC | PRN
  Administered 2012-04-17: 4 mg via INTRAVENOUS

## 2012-04-17 MED ORDER — ROCURONIUM BROMIDE 100 MG/10ML IV SOLN
INTRAVENOUS | Status: DC | PRN
  Administered 2012-04-17: 20 mg via INTRAVENOUS

## 2012-04-17 MED ORDER — SODIUM CHLORIDE 0.9 % IV SOLN
3.0000 g | Freq: Four times a day (QID) | INTRAVENOUS | Status: DC
  Administered 2012-04-17 – 2012-05-07 (×80): 3 g via INTRAVENOUS
  Filled 2012-04-17 (×86): qty 3

## 2012-04-17 MED ORDER — GADOBENATE DIMEGLUMINE 529 MG/ML IV SOLN
15.0000 mL | Freq: Once | INTRAVENOUS | Status: AC
  Administered 2012-04-17: 15 mL via INTRAVENOUS

## 2012-04-17 MED ORDER — MIDAZOLAM HCL 5 MG/5ML IJ SOLN
INTRAMUSCULAR | Status: DC | PRN
  Administered 2012-04-17: 2 mg via INTRAVENOUS

## 2012-04-17 MED ORDER — GLYCOPYRROLATE 0.2 MG/ML IJ SOLN
INTRAMUSCULAR | Status: DC | PRN
  Administered 2012-04-17: 0.2 mg via INTRAVENOUS

## 2012-04-17 MED ORDER — MORPHINE SULFATE 2 MG/ML IJ SOLN
2.0000 mg | INTRAMUSCULAR | Status: DC | PRN
  Administered 2012-04-17 – 2012-04-18 (×5): 2 mg via INTRAVENOUS
  Filled 2012-04-17 (×5): qty 1

## 2012-04-17 MED ORDER — LACTATED RINGERS IV SOLN
INTRAVENOUS | Status: DC | PRN
  Administered 2012-04-17: 22:00:00 via INTRAVENOUS

## 2012-04-17 MED ORDER — DROPERIDOL 2.5 MG/ML IJ SOLN
INTRAMUSCULAR | Status: DC | PRN
  Administered 2012-04-17: 0.625 mg via INTRAVENOUS

## 2012-04-17 MED ORDER — CLINDAMYCIN PHOSPHATE 900 MG/50ML IV SOLN
900.0000 mg | Freq: Three times a day (TID) | INTRAVENOUS | Status: DC
  Administered 2012-04-17 – 2012-04-21 (×11): 900 mg via INTRAVENOUS
  Filled 2012-04-17 (×14): qty 50

## 2012-04-17 MED ORDER — SUFENTANIL CITRATE 50 MCG/ML IV SOLN
INTRAVENOUS | Status: DC | PRN
  Administered 2012-04-17 (×2): 10 ug via INTRAVENOUS

## 2012-04-17 MED ORDER — PROPOFOL 10 MG/ML IV EMUL
INTRAVENOUS | Status: DC | PRN
  Administered 2012-04-17: 150 mg via INTRAVENOUS

## 2012-04-17 MED ORDER — LACTATED RINGERS IV SOLN
INTRAVENOUS | Status: DC | PRN
  Administered 2012-04-17: 23:00:00 via INTRAVENOUS

## 2012-04-17 MED ORDER — SUCCINYLCHOLINE CHLORIDE 20 MG/ML IJ SOLN
INTRAMUSCULAR | Status: DC | PRN
  Administered 2012-04-17: 65 mg via INTRAVENOUS

## 2012-04-17 MED ORDER — IOHEXOL 300 MG/ML  SOLN
100.0000 mL | Freq: Once | INTRAMUSCULAR | Status: AC | PRN
  Administered 2012-04-17: 100 mL via INTRAVENOUS

## 2012-04-17 SURGICAL SUPPLY — 61 items
APPLIER CLIP 9.375 SM OPEN (CLIP) ×3
ATTRACTOMAT 16X20 MAGNETIC DRP (DRAPES) IMPLANT
BALLN PULM 15 16.5 18X75 (BALLOONS) ×3
BALLOON PULM 15 16.5 18X75 (BALLOONS) ×2 IMPLANT
BANDAGE GAUZE ELAST BULKY 4 IN (GAUZE/BANDAGES/DRESSINGS) ×3 IMPLANT
BLADE SURG 15 STRL LF DISP TIS (BLADE) IMPLANT
BLADE SURG 15 STRL SS (BLADE)
CANISTER SUCTION 2500CC (MISCELLANEOUS) ×3 IMPLANT
CLEANER TIP ELECTROSURG 2X2 (MISCELLANEOUS) ×3 IMPLANT
CLIP APPLIE 9.375 SM OPEN (CLIP) ×2 IMPLANT
CLOTH BEACON ORANGE TIMEOUT ST (SAFETY) ×3 IMPLANT
CONT SPEC 4OZ CLIKSEAL STRL BL (MISCELLANEOUS) ×6 IMPLANT
CORDS BIPOLAR (ELECTRODE) IMPLANT
COVER SURGICAL LIGHT HANDLE (MISCELLANEOUS) ×3 IMPLANT
COVER TABLE BACK 60X90 (DRAPES) ×3 IMPLANT
CRADLE DONUT ADULT HEAD (MISCELLANEOUS) IMPLANT
DRAIN CHANNEL 10F 3/8 F FF (DRAIN) ×3 IMPLANT
DRAIN CHANNEL 15F RND FF W/TCR (WOUND CARE) IMPLANT
DRAIN PENROSE 1/2X12 LTX STRL (WOUND CARE) IMPLANT
DRAPE PROXIMA HALF (DRAPES) ×3 IMPLANT
ELECT COATED BLADE 2.86 ST (ELECTRODE) ×3 IMPLANT
ELECT REM PT RETURN 9FT ADLT (ELECTROSURGICAL) ×3
ELECTRODE REM PT RTRN 9FT ADLT (ELECTROSURGICAL) ×2 IMPLANT
EVACUATOR SILICONE 100CC (DRAIN) ×3 IMPLANT
GAUZE SPONGE 4X4 16PLY XRAY LF (GAUZE/BANDAGES/DRESSINGS) ×6 IMPLANT
GLOVE ECLIPSE 8.0 STRL XLNG CF (GLOVE) ×3 IMPLANT
GOWN STRL NON-REIN LRG LVL3 (GOWN DISPOSABLE) ×3 IMPLANT
GOWN STRL REIN XL XLG (GOWN DISPOSABLE) ×3 IMPLANT
GUARD TEETH (MISCELLANEOUS) ×3 IMPLANT
KIT BASIN OR (CUSTOM PROCEDURE TRAY) ×3 IMPLANT
KIT ROOM TURNOVER OR (KITS) ×3 IMPLANT
LOCATOR NERVE 3 VOLT (DISPOSABLE) IMPLANT
MARKER SKIN DUAL TIP RULER LAB (MISCELLANEOUS) ×3 IMPLANT
NS IRRIG 1000ML POUR BTL (IV SOLUTION) ×3 IMPLANT
PAD ARMBOARD 7.5X6 YLW CONV (MISCELLANEOUS) ×6 IMPLANT
PATTIES SURGICAL .5 X3 (DISPOSABLE) IMPLANT
PATTIES SURGICAL 1X1 (DISPOSABLE) IMPLANT
PENCIL BUTTON HOLSTER BLD 10FT (ELECTRODE) ×3 IMPLANT
SPECIMEN JAR MEDIUM (MISCELLANEOUS) ×3 IMPLANT
SPECIMEN JAR SMALL (MISCELLANEOUS) ×3 IMPLANT
SPONGE GAUZE 4X4 12PLY (GAUZE/BANDAGES/DRESSINGS) ×3 IMPLANT
SPONGE INTESTINAL PEANUT (DISPOSABLE) IMPLANT
SPONGE LAP 18X18 X RAY DECT (DISPOSABLE) ×3 IMPLANT
STAPLER VISISTAT 35W (STAPLE) ×3 IMPLANT
SURGILUBE 2OZ TUBE FLIPTOP (MISCELLANEOUS) ×3 IMPLANT
SUT CHROMIC 4 0 PS 2 18 (SUTURE) ×6 IMPLANT
SUT ETHILON 3 0 PS 1 (SUTURE) ×3 IMPLANT
SUT ETHILON 5 0 PS 2 18 (SUTURE) ×3 IMPLANT
SUT SILK 2 0 REEL (SUTURE) IMPLANT
SUT SILK 2 0 SH CR/8 (SUTURE) ×3 IMPLANT
SUT SILK 3 0 REEL (SUTURE) ×3 IMPLANT
SUT SILK 4 0 REEL (SUTURE) ×3 IMPLANT
SYR INFLATE BILIARY GAUGE (MISCELLANEOUS) ×3 IMPLANT
TOWEL OR 17X24 6PK STRL BLUE (TOWEL DISPOSABLE) ×6 IMPLANT
TOWEL OR 17X26 10 PK STRL BLUE (TOWEL DISPOSABLE) ×3 IMPLANT
TRAP SPECIMEN MUCOUS 40CC (MISCELLANEOUS) IMPLANT
TRAY ENT MC OR (CUSTOM PROCEDURE TRAY) ×3 IMPLANT
TRAY FOLEY CATH 14FRSI W/METER (CATHETERS) IMPLANT
TUBE CONNECTING 12X1/4 (SUCTIONS) ×3 IMPLANT
TUBE FEEDING 10FR FLEXIFLO (MISCELLANEOUS) IMPLANT
WATER STERILE IRR 1000ML POUR (IV SOLUTION) ×3 IMPLANT

## 2012-04-17 NOTE — Preoperative (Signed)
Beta Blockers   Reason not to administer Beta Blockers:Not Applicable 

## 2012-04-17 NOTE — Anesthesia Preprocedure Evaluation (Addendum)
Anesthesia Evaluation  Patient identified by MRN, date of birth, ID band Patient awake    Reviewed: Allergy & Precautions, H&P , NPO status , Patient's Chart, lab work & pertinent test results  History of Anesthesia Complications Negative for: history of anesthetic complications  Airway Mallampati: II TM Distance: >3 FB Neck ROM: Full    Dental  (+) Teeth Intact and Dental Advisory Given   Pulmonary neg pulmonary ROS,          Cardiovascular negative cardio ROS  Rhythm:regular Rate:Normal     Neuro/Psych negative neurological ROS  negative psych ROS   GI/Hepatic negative GI ROS, Neg liver ROS,   Endo/Other  negative endocrine ROS  Renal/GU negative Renal ROS  negative genitourinary   Musculoskeletal negative musculoskeletal ROS (+)   Abdominal   Peds  Hematology negative hematology ROS (+)   Anesthesia Other Findings   Reproductive/Obstetrics                         Anesthesia Physical Anesthesia Plan  ASA: II  Anesthesia Plan: General   Post-op Pain Management:    Induction: Intravenous  Airway Management Planned: Oral ETT  Additional Equipment:   Intra-op Plan:   Post-operative Plan: Extubation in OR  Informed Consent: I have reviewed the patients History and Physical, chart, labs and discussed the procedure including the risks, benefits and alternatives for the proposed anesthesia with the patient or authorized representative who has indicated his/her understanding and acceptance.   Dental advisory given  Plan Discussed with: CRNA, Anesthesiologist and Surgeon  Anesthesia Plan Comments:        Anesthesia Quick Evaluation

## 2012-04-17 NOTE — Consult Note (Signed)
Reason for Consult: Possible mediastinitis  Referring Physician: Dr. Tobi Bastos Nabi is an 51 y.o. male.  HPI:   The patient is a 51 year old gentleman who presented to the emergency room on April 15 with pain and swelling in his right neck with symptoms of nausea and vomiting. He was apparently diagnosed with gastroenteritis and sent home but continued to have these symptoms with development of acute swelling of his left hand and arm as well as pain in his left groin and left ankle. He was admitted and started on vancomycin. Subsequent blood cultures grew group B strep. X-rays of his left arm suggested a fluid collection and he was seen by orthopedic surgery who felt this was cellulitis and not an abscess. The pain in his right neck and chest has worsened and a CT scan of the neck and chest done today shows extensive abscess formation within the muscles of the right neck extending down to the right sternoclavicular region with a small fluid collection behind the manubrium. There is also edema beneath the right pectoralis major muscle. A CT scan of the abdomen and pelvis shows a fluid collection within the left iliopsoas muscle suggesting possible abscess there. An MRI of the left upper extremity has been done and the official results pending. At the present time the patient is being prepped for the operating room.   History reviewed. No pertinent past medical history.  Past Surgical History  Procedure Date  . Back surgery     No family history on file.  Social History:  reports that he has never smoked. He has never used smokeless tobacco. He reports that he drinks about 1.5 ounces of alcohol per week. He reports that he does not use illicit drugs.  Allergies: No Known Allergies  Medications:  I have reviewed the patient's current medications. Prior to Admission:  Prescriptions prior to admission  Medication Sig Dispense Refill  . acetaminophen (TYLENOL) 325 MG tablet Take  650 mg by mouth every 6 (six) hours as needed. pain      . Aspirin-Acetaminophen (GOODYS BODY PAIN PO) Take 1 Package by mouth daily as needed. For pain      . naproxen sodium (ANAPROX) 220 MG tablet Take 440 mg by mouth daily as needed. For pain      . ondansetron (ZOFRAN ODT) 4 MG disintegrating tablet Take 1 tablet (4 mg total) by mouth every 6 (six) hours as needed for nausea.  6 tablet  0  . oxyCODONE-acetaminophen (PERCOCET) 5-325 MG per tablet Take 1 tablet by mouth every 6 (six) hours as needed for pain.  30 tablet  0   Scheduled:   . ampicillin-sulbactam (UNASYN) IV  3 g Intravenous Q6H  . clindamycin (CLEOCIN) IV  900 mg Intravenous Q8H  . gadobenate dimeglumine  15 mL Intravenous Once  . potassium chloride  40 mEq Oral Daily  . sodium chloride  3 mL Intravenous Q12H  . DISCONTD:  ceFAZolin (ANCEF) IV  1 g Intravenous Q8H   Continuous:   . sodium chloride 50 mL/hr at 04/16/12 2156   WUX:[id number], fentaNYL, iohexol, morphine, ondansetron (ZOFRAN) IV, ondansetron, DISCONTD: morphine  Results for orders placed during the hospital encounter of 04/12/12 (from the past 48 hour(s))  CBC     Status: Abnormal   Collection Time   04/16/12  5:47 AM      Component Value Range Comment   WBC 15.5 (*) 4.0 - 10.5 (K/uL)    RBC 4.73  4.22 -  5.81 (MIL/uL)    Hemoglobin 10.6 (*) 13.0 - 17.0 (g/dL)    HCT 16.1 (*) 09.6 - 52.0 (%)    MCV 65.5 (*) 78.0 - 100.0 (fL)    MCH 22.4 (*) 26.0 - 34.0 (pg)    MCHC 34.2  30.0 - 36.0 (g/dL)    RDW 04.5  40.9 - 81.1 (%)    Platelets 283  150 - 400 (K/uL)   GLUCOSE, CAPILLARY     Status: Abnormal   Collection Time   04/16/12  8:10 AM      Component Value Range Comment   Glucose-Capillary 117 (*) 70 - 99 (mg/dL)   HEPATIC FUNCTION PANEL     Status: Abnormal   Collection Time   04/16/12  2:51 PM      Component Value Range Comment   Total Protein 6.9  6.0 - 8.3 (g/dL)    Albumin 1.6 (*) 3.5 - 5.2 (g/dL)    AST 97 (*) 0 - 37 (U/L)    ALT 45  0 -  53 (U/L)    Alkaline Phosphatase 191 (*) 39 - 117 (U/L)    Total Bilirubin 0.6  0.3 - 1.2 (mg/dL)    Bilirubin, Direct 0.2  0.0 - 0.3 (mg/dL)    Indirect Bilirubin 0.4  0.3 - 0.9 (mg/dL)   CBC     Status: Abnormal   Collection Time   04/17/12 10:42 AM      Component Value Range Comment   WBC 13.9 (*) 4.0 - 10.5 (K/uL)    RBC 4.98  4.22 - 5.81 (MIL/uL)    Hemoglobin 11.2 (*) 13.0 - 17.0 (g/dL)    HCT 91.4 (*) 78.2 - 52.0 (%)    MCV 67.1 (*) 78.0 - 100.0 (fL)    MCH 22.5 (*) 26.0 - 34.0 (pg)    MCHC 33.5  30.0 - 36.0 (g/dL)    RDW 95.6  21.3 - 08.6 (%)    Platelets 309  150 - 400 (K/uL)   DIFFERENTIAL     Status: Abnormal   Collection Time   04/17/12 10:42 AM      Component Value Range Comment   Neutrophils Relative 74  43 - 77 (%)    Lymphocytes Relative 13  12 - 46 (%)    Monocytes Relative 12  3 - 12 (%)    Eosinophils Relative 1  0 - 5 (%)    Basophils Relative 0  0 - 1 (%)    Neutro Abs 10.3 (*) 1.7 - 7.7 (K/uL)    Lymphs Abs 1.8  0.7 - 4.0 (K/uL)    Monocytes Absolute 1.7 (*) 0.1 - 1.0 (K/uL)    Eosinophils Absolute 0.1  0.0 - 0.7 (K/uL)    Basophils Absolute 0.0  0.0 - 0.1 (K/uL)    RBC Morphology TARGET CELLS      WBC Morphology TOXIC GRANULATION   MILD LEFT SHIFT (1-5% METAS, OCC MYELO, OCC BANDS)   Smear Review        Value: PLATELET CLUMPS NOTED ON SMEAR, COUNT APPEARS ADEQUATE  PROTIME-INR     Status: Abnormal   Collection Time   04/17/12  4:50 PM      Component Value Range Comment   Prothrombin Time 15.3 (*) 11.6 - 15.2 (seconds)    INR 1.18  0.00 - 1.49    TYPE AND SCREEN     Status: Normal   Collection Time   04/17/12  4:50 PM      Component Value Range  Comment   ABO/RH(D) O POS      Antibody Screen NEG      Sample Expiration 04/20/2012       Ct Abdomen Pelvis Wo Contrast  04/17/2012  *RADIOLOGY REPORT*  Clinical Data: Left groin pain  CT ABDOMEN AND PELVIS WITHOUT CONTRAST  Technique:  Multidetector CT imaging of the abdomen and pelvis was performed  following the standard protocol without intravenous contrast.  Comparison: None.  Findings: Bilateral pleural effusions are noted with associated compressive atelectasis.  Small pericardial effusion is present.  Unenhanced CT was performed per clinician order.  Lack of IV contrast limits sensitivity and specificity, especially for evaluation of abdominal/pelvic solid viscera.  There is minimal retained contrast within nondilated renal collecting systems from contrast enhanced exam performed separately today.  There is minimal nonspecific peri nephric stranding bilaterally, right greater than left.  Hyperdense area of the peripheral renal cortex of the right upper renal pole image 32 measures 6 mm.  Unenhanced liver, spleen, pancreas, and adrenal glands are normal.  No hydronephrosis.  The bowel is grossly normal.  Bladder is normal.  There is fusiform enlargement and inhomogeneity of the central hypodensity involving the left iliopsoas muscle.  The more focal well-defined area of hypodensity in the inferior portion of this muscle, overlying the region of the left femoral head, measures 2.8 x 2.0 cm.  A small amount of fluid is noted tracking along the interfascial space of the upper left quadriceps muscles.  Mild subcutaneous edema is present.  No lymphadenopathy.  Lumbar fusion hardware again noted.  IMPRESSION: Fusiform enlargement and hypodensity of the left iliopsoas muscle. Differential considerations include abscess, sterile fluid collection, or hematoma.  Findings discussed with Dr. Lazarus Salines by Dr. Chilton Si 04/17/12 825pm.  Small bilateral pleural effusions and associated compressive atelectasis.  Small pericardial effusion.  Original Report Authenticated By: Harrel Lemon, M.D.   Ct Soft Tissue Neck W Contrast  04/17/2012  *RADIOLOGY REPORT*  Clinical Data:  Pain and swelling right arm and chest.  Fever and positive blood cultures.  CT NECK AND CHEST WITH CONTRAST  Technique:  Multidetector CT imaging of  the neck and chest was performed using the standard protocol after bolus administration of intravenous contrast.  Contrast: OMNIPAQUE IOHEXOL 300 MG/ML  SOLN  Comparison:   None.  CT NECK  Findings:  Large fluid collection in the right neck with peripheral enhancement  and gas bubbles compatible with abscess.  The largest component of the  abscess is  in the right lower neck at the level of thyroid and  measures 6.4 x 3.0 cm.  Multiple loculations of abscess extending to the right sternoclavicular joint and retrosternal mediastinum.  The mediastinal abscess measures approximately 1.1 x 2.5 cm.  Abscess also extends deep to the pectoralis muscle on the right and extends superiorly along the posterior margin of the sternocleidomastoid muscle on the right. The sternocleidomastoid muscle is enlarged and edematous due to myositis.  No bony destruction is seen involving the sternal clavicular joint to suggest osteomyelitis however  this appears to be  septic arthritis.  Carotid artery and jugular vein are patent bilaterally.  Trachea is deviated to the left due to mass effect.  No compromise of the airway.  Small cervical lymph nodes are present without pathologic adenopathy.  The thyroid is displaced on the right but not involved.  IMPRESSION: Large mass lesion in the right lower neck.  This extends into the sternal clavicular joint which appears to be involved with infection.  There is extension of abscess into  the superior mediastinum posterior to the manubrium.  There is also abscess extending deep to the pectoralis muscle.  CT CHEST  Findings: Small pleural effusions bilaterally, right greater than left.  Mild bibasilar atelectasis.  No definite pneumonia or lung abscess.  Negative for pericardial effusion.  Abscess extends posterior to the manubrium compatible with mediastinitis and abscess related to neck infection as described in the above report.  Imaging of the upper abdomen reveal distended gallbladder  with fluid surrounding the gallbladder.  This may be due to cholecystitis.  Consider ultrasound is symptomatic.  IMPRESSION: Bilateral pleural effusions and bibasilar atelectasis.  Mediastinitis with abscess extending from the right neck into the superior mediastinum.  Pericholecystic fluid, suggestive of cholecystitis.  I discussed the findings by telephone with Algis Downs PA on 04/17/2012 at 1400 hours.  Original Report Authenticated By: Camelia Phenes, M.D.   Ct Chest W Contrast  04/17/2012  *RADIOLOGY REPORT*  Clinical Data:  Pain and swelling right arm and chest.  Fever and positive blood cultures.  CT NECK AND CHEST WITH CONTRAST  Technique:  Multidetector CT imaging of the neck and chest was performed using the standard protocol after bolus administration of intravenous contrast.  Contrast: OMNIPAQUE IOHEXOL 300 MG/ML  SOLN  Comparison:   None.  CT NECK  Findings:  Large fluid collection in the right neck with peripheral enhancement  and gas bubbles compatible with abscess.  The largest component of the  abscess is  in the right lower neck at the level of thyroid and  measures 6.4 x 3.0 cm.  Multiple loculations of abscess extending to the right sternoclavicular joint and retrosternal mediastinum.  The mediastinal abscess measures approximately 1.1 x 2.5 cm.  Abscess also extends deep to the pectoralis muscle on the right and extends superiorly along the posterior margin of the sternocleidomastoid muscle on the right. The sternocleidomastoid muscle is enlarged and edematous due to myositis.  No bony destruction is seen involving the sternal clavicular joint to suggest osteomyelitis however  this appears to be  septic arthritis.  Carotid artery and jugular vein are patent bilaterally.  Trachea is deviated to the left due to mass effect.  No compromise of the airway.  Small cervical lymph nodes are present without pathologic adenopathy.  The thyroid is displaced on the right but not involved.   IMPRESSION: Large mass lesion in the right lower neck.  This extends into the sternal clavicular joint which appears to be involved with infection.  There is extension of abscess into  the superior mediastinum posterior to the manubrium.  There is also abscess extending deep to the pectoralis muscle.  CT CHEST  Findings: Small pleural effusions bilaterally, right greater than left.  Mild bibasilar atelectasis.  No definite pneumonia or lung abscess.  Negative for pericardial effusion.  Abscess extends posterior to the manubrium compatible with mediastinitis and abscess related to neck infection as described in the above report.  Imaging of the upper abdomen reveal distended gallbladder with fluid surrounding the gallbladder.  This may be due to cholecystitis.  Consider ultrasound is symptomatic.  IMPRESSION: Bilateral pleural effusions and bibasilar atelectasis.  Mediastinitis with abscess extending from the right neck into the superior mediastinum.  Pericholecystic fluid, suggestive of cholecystitis.  I discussed the findings by telephone with Algis Downs PA on 04/17/2012 at 1400 hours.  Original Report Authenticated By: Camelia Phenes, M.D.    Review of Systems  Constitutional: Positive for fever, chills,  malaise/fatigue and diaphoresis. Negative for weight loss.  HENT: Positive for ear pain and sore throat.   Eyes: Negative.   Respiratory: Negative for cough, hemoptysis, sputum production and shortness of breath.   Cardiovascular: Positive for chest pain. Negative for orthopnea and PND.  Gastrointestinal: Positive for nausea and vomiting. Negative for abdominal pain.  Genitourinary: Negative.   Musculoskeletal: Positive for joint pain.       Severe pain in left groin and thigh. Pain in right shoulder.  Skin: Negative.   Neurological: Positive for weakness and headaches.  Endo/Heme/Allergies: Negative.   Psychiatric/Behavioral: Negative.    Blood pressure 154/101, pulse 101, temperature 99.6 F  (37.6 C), temperature source Oral, resp. rate 22, height 5\' 4"  (1.626 m), weight 61 kg (134 lb 7.7 oz), SpO2 94.00%. Physical Exam  Constitutional: He is oriented to person, place, and time. He appears well-developed and well-nourished. He appears distressed.  HENT:  Head: Normocephalic.  Mouth/Throat: Oropharynx is clear and moist.  Eyes: EOM are normal. Pupils are equal, round, and reactive to light.  Neck:       Neck diffusely swollen and tender  Cardiovascular: Normal rate, regular rhythm and normal heart sounds.  Exam reveals no gallop and no friction rub.   No murmur heard. Respiratory: Effort normal and breath sounds normal. He exhibits tenderness.       Tender over right anterior chest  GI: Soft. Bowel sounds are normal. He exhibits no mass. There is no tenderness.  Musculoskeletal: He exhibits edema.       Left hand swelling  Neurological: He is alert and oriented to person, place, and time. No cranial nerve deficit or sensory deficit.       Unable to assess strength due to pain.  Skin: Skin is warm and dry. There is erythema.       Over right neck  Psychiatric: He has a normal mood and affect.      Assessment/Plan:  The patient has abscess formation involving the muscles of the right neck with extension downward along the fascial planes to involve the area around the right sternoclavicular joint as well as behind the manubrium. There is significant edema around the upper portion of the pectoralis major muscle. This will require open drainage by ENT and the patient has been posted for surgery by Dr. Lazarus Salines. There is a small fluid collection posterior to the manubrium which can be drained through the neck incision. He does not have extensive mediastinitis. I don't think there is a need to open the right sternoclavicular joint at this point. He does have small bilateral pleural effusions and a small pericardial effusion which are most likely related to his inflammatory state. I  would not drain these at this time unless they progress. He also has a fluid collection in the left iliopsoas muscle which will need to be evaluated by general surgery. The etiology of his bacteremia and widely metastatic infection is unclear at this time. I will continue to follow his progress.  Alleen Borne 04/17/2012, 9:46 PM

## 2012-04-17 NOTE — Progress Notes (Signed)
Patient ID: [name], male   DOB: 18-Mar-1961, 51 y.o.   MRN: [id number]  PROGRESS NOTE  [name] ION:[id number] DOB: 1961/06/30 DOA: 04/12/2012 PCP: Sheila Oats, MD, MD  Consults: 1.  Infectious Disease 2.  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  Hand Surgeon  Procedures:  X-ray and 4/19 = diffuse soft tissue swelling no acute bony abnormalities demonstrated. Degenerative changes. bony sclerosis of distal phalanx fourth finger  CT scan 4/19 = diffuse edema of the soft tissues and intermuscular planes of the distal forearm and wrist. Volar fluid collection at the distal flexor tendons and muscles reflecting diffuse edema-no definite localized abscess demonstrated  Antibiotics:  Vancomycin 4/19-4/21  moxifloxacin 4/20-4/20  Ancef 4/21   Subjective  Pain and decreased ROM in left hand.  Also pain and swelling in area of right upper chest near junction of head and neck.  Painful nodes in left groin.  Right upper chest swelling started first, then left groin, finally left hand (1 week ago)    Objective   Interim History: Blood cultures positive for Streptococcus A (Pyogenes)  Objective: Filed Vitals:   04/16/12 1951 04/16/12 2258 04/17/12 0506 04/17/12 1349  BP: 154/102  134/92 154/101  Pulse: 98  90 101  Temp: 100.7 F (38.2 C) 98.7 F (37.1 C) 99.2 F (37.3 C) 99.6 F (37.6 C)  TempSrc: Oral  Oral Oral  Resp: 32  20 22  Height:      Weight:      SpO2: 95%  95% 94%    Intake/Output Summary (Last 24 hours) at 04/17/12 1403 Last data filed at 04/17/12 1354  Gross per 24 hour  Intake   2630 ml  Output   2650 ml  Net    -20 ml    Exam:  HEENT-Arousable.  More oriented CHEST-clinically clear with no added sound, area of swelling and tenderness in right  upper chest near junction of neck and chest. CARDS-S1, S2 no murmur or gallop  ABD-soft nontender nondistended. Small lymphadenopathy in left inguinal area Extremities-warm, darkened and swollen left hand and forearm.  painful to touch  Area marked-out Left groin small tender palpable lymph node. NEURO-alert, oriented x3, grossly intact    Data Reviewed: Basic Metabolic Panel:  Lab 04/15/12 2440 04/14/12 0650 04/12/12 2311  NA 135 137 124*  K 3.1* 3.1* --  CL 98 98 83*  CO2 26 26 28   GLUCOSE 133* 151* 117*  BUN 13 15 23   CREATININE 0.88 0.94 1.38*  CALCIUM 7.7* 8.1* 9.4  MG -- -- --  PHOS -- -- --   Liver Function Tests:  Lab 04/16/12 1451 04/15/12 0555 04/14/12 0650  AST 97* 108* 101*  ALT 45 44 42  ALKPHOS 191* 209* 142*  BILITOT 0.6 0.8 0.9  PROT 6.9 6.4 6.9  ALBUMIN 1.6* 1.7* 1.9*  CBC:  Lab 04/17/12 1042 04/16/12 0547 04/15/12 0555 04/14/12 0650 04/12/12 2311  WBC 13.9* 15.5* 15.0* 21.0* 17.3*  NEUTROABS 10.3* -- -- -- 14.9*  HGB 11.2* 10.6* 10.1* 11.2* 12.7*  HCT 33.4* 31.0* 29.7* 32.4* 36.6*  MCV 67.1* 65.5* 65.3* 65.2* 66.4*  PLT 309 283 232 245 206   CBG:  Lab 04/16/12 0810 04/15/12 0802 04/14/12 0801 04/13/12 1324  GLUCAP 117* 128* 135* 90    Recent Results (from the past 240 hour(s))  CULTURE,  BLOOD (ROUTINE X 2)     Status: Normal (Preliminary result)   Collection Time   04/13/12  1:28 AM      Component Value Range Status Comment   Specimen Description BLOOD RIGHT HAND   Final    Special Requests BOTTLES DRAWN AEROBIC AND ANAEROBIC [facility] EACH   Final    Culture  Setup Time [id number]   Final    Culture     Final    Value: GROUP A STREP (S.PYOGENES) ISOLATED     Note: CRITICAL RESULT CALLED TO, READ BACK BY AND VERIFIED WITH: RN D. CELANO ON 04/14/12 AT 1050 BY DTERRY     Note: Gram Stain Report Called to,Read Back By and Verified With: RN M. TROGDEN ON 04/13/12 AT 1930 BY DTERRY   Report Status PENDING   Incomplete   CULTURE, BLOOD (ROUTINE X 2)      Status: Normal (Preliminary result)   Collection Time   04/13/12  1:34 AM      Component Value Range Status Comment   Specimen Description BLOOD RIGHT ARM   Final    Special Requests BOTTLES DRAWN AEROBIC AND ANAEROBIC 10CC EACH   Final    Culture  Setup Time [id number]   Final    Culture     Final    Value: GROUP A STREP (S.PYOGENES) ISOLATED     Note: CRITICAL RESULT CALLED TO, READ BACK BY AND VERIFIED WITH: RN D. CELANO ON 04/14/12 AT 1050 BY DTERRY     Note: Gram Stain Report Called to,Read Back By and Verified With: RN M. TROGDEN ON 04/13/12 AT 1930 BY DTERRY   Report Status PENDING   Incomplete      Studies:              Pending: 1. CT Chest and neck. 2.  MRI left hand and forearm 3. 2D echo    Scheduled Meds:    .  ceFAZolin (ANCEF) IV  1 g Intravenous Q8H  . potassium chloride  40 mEq Oral Daily  . sodium chloride  3 mL Intravenous Q12H   Continuous Infusions:    . sodium chloride 50 mL/hr at 04/16/12 2156     Assessment/Plan:  Left Hand Infection- doesn't appear significantly improved after several days on IV antibiotics.  Dr. Jerral Ralph conferred with hand surgeon and decided to order MRI hand and forearm with contrast.  Dr. Melvyn Novas will follow.    Lower neck abscess with mediastinal extension  - CT neck / chest reveals abscess with gas bubbles that extends  from above the clavical to the mediastienum with a significant portion in the sub pectrol area. Re-consulted ID who recommended starting Unasyn.  Will D/C ancef. Talked with Dr. Laneta Simmers of CT Surgery about draining the abscess he asked me to call ENT.  ENT is consulted-Have spoken with Dr Sharen Hones will evaluate as well.Keep NPO till further order. Dr Truett Mainland left message again with Dr Laneta Simmers.  Left groin with painful lymph nodes - CT Abdomen pelvis ordered-to make sure no abscess developing there as well.   hyponatremia- resolved.  recent gastroenteritis-resolved.  This may have been due to original  infection and bacteremia (time frames overlap)  possible diabetes mellitus . Aic was 6.1.  Would not cover with insulin out-patient, but as acutely ill, would cover in hospital.   elevated transaminases-not sure the exact etiology-?related to bacteremia/sepsis, clinically doubt cholecystitis, as not tender in RUQ area, however will go ahead and get a USG abdomen.  acute  kidney injury- resolved.  secondary to sepsis-creatinine trended down this is likely secondary to multiple reasons inclusive of dehydration  hypokalemia-recheck lytes in am.  anemia drop in hemoglobin from 12.7 on admission to 10.1. Hgb stable and trending up. No frank bleeding will follow cbc.  Code Status: Full Family Communication: None at bedside Disposition Plan: remain inpatiient  Romualdo Bolk [facility] Pager: [phone]  04/17/2012, 2:03 PM    LOS: 5 days   Attending -I have seen and examined Mr Furlan, he has been having right lower neck pain for the past 10 days, I saw him for the very first time today. A CT Chest and Abdomen done today-shows a neck abscess with mediastinal extension. He is complaining of left groin pain as well, given the fact that he has bacteremia and multiple sites on infection, I will order a CT abdomen and pelvis as well. I have already consulted Dr Daiva Eves, who suggested Unasyn and Clindamycin. I have personally talked with Dr Shelba Flake already-he will evaluate shortly as well. I have placed a call to Dr Bartle-Cardiothoracic surgery-he is in the OR and am awaiting a call back. In the meantime, I will keep him NPO, patient will likely require surgery and possibly at multiple sites.Will check a 2 D Echo as well.  Although he certainly has a big infection burden, he does not look acutely sick and has stable hemodynamics, so for now I will monitor him on the floor for now. I have reviewed the above, I have made the necessary changes and agree with the assessment and  plan  Dr Windell Norfolk

## 2012-04-17 NOTE — Consult Note (Addendum)
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 Jerral Ralph   HPI: [name] is an 51 y.o. male who presented to the ED at [facility] on April 15th with swelling in his neck and chest with systemic symptoms of nausea and vomiting. He was apparently diagnosed with gastroenteritis and sent home. He returned with above symptoms plus acute swelling of his left hand and arm as well as pain in his left groin and left ankle. He had SIRS physiology. He had blood cultures drawn and was placed vancomycin. Films showed fluid collection on volar aspect. He was seen by Dr. Melvyn Novas with orthopedics. Cefazolin added. GROUP A STREPTOCOCCUS grew from blood. Pain in neck and chest worsened and CT done today shows extensive abscess with gas formation extending from neck into the sterno-clavicular region with involvement of  joint and extension of the abscess posterior to the manubrium with evidence of mediastinitis. ENT, and CVTS surgery have been consulted. The patient also has excrutiating pain in left thigh and groin. Left hand still swollen and exquisitely tender. Left ankle less tender but hot. We were consulted to help manage this extensive and disseminated GAS infection with bacteremia and mx metastatic foci. I spent greater than 60 minutes with the patient including greater than 50% of time in face to face counsel of the patient and in coordination of their care.     History reviewed. No pertinent past medical history.  Past Surgical History  Procedure Date  . Back surgery   ergies:   No Known Allergies   Medications: I have reviewed patients current medications as documented in Epic Anti-infectives     Start     Dose/Rate Route Frequency Ordered Stop   04/17/12 1600   clindamycin (CLEOCIN) IVPB  900 mg        900 mg 100 mL/hr over 30 Minutes Intravenous Every 8 hours 04/17/12 1454     04/17/12 1600  Ampicillin-Sulbactam (UNASYN) 3 g in sodium chloride 0.9 % 100 mL IVPB       3 g 100 mL/hr over 60 Minutes Intravenous Every 6 hours 04/17/12 1455     04/14/12 1400   ceFAZolin (ANCEF) IVPB 1 g/50 mL premix  Status:  Discontinued        1 g 100 mL/hr over 30 Minutes Intravenous 3 times per day 04/14/12 1057 04/17/12 1426   04/13/12 1900   vancomycin (VANCOCIN) 500 mg in sodium chloride 0.9 % 100 mL IVPB  Status:  Discontinued        500 mg 100 mL/hr over 60 Minutes Intravenous Every 12 hours 04/13/12 1601 04/14/12 1058   04/13/12 0815   moxifloxacin (AVELOX) IVPB 400 mg        400 mg 250 mL/hr over 60 Minutes Intravenous  Once 04/13/12 0805 04/13/12 0953   04/13/12 0500   vancomycin (VANCOCIN) IVPB 1000 mg/200 mL premix  Status:  Discontinued        1,000 mg 200 mL/hr over 60 Minutes Intravenous Every 12 hours 04/13/12 0453 04/13/12 1414   04/13/12 0445   vancomycin (VANCOCIN) IVPB 1000 mg/200 mL premix        1,000 mg 200 mL/hr over 60 Minutes Intravenous  Once 04/13/12 0442 04/13/12 1610          Social History:  reports that he has never smoked.  He has never used smokeless tobacco. He reports that he drinks about 1.5 ounces of alcohol per week. He reports that he does not use illicit drugs.  No family history on file.  As in HPI and primary teams notes otherwise 12 point review of systems is negative  Blood pressure 154/101, pulse 101, temperature 99.6 F (37.6 C), temperature source Oral, resp. rate 22, height 5\' 4"  (1.626 m), weight 134 lb 7.7 oz (61 kg), SpO2 94.00%.  General: Alert and awake, oriented x3, not in any acute distress. HEENT: anicteric sclera, pupils reactive to light and accommodation, EOMI, oropharynx clear and without exudate CVS tachycardic e, normal r,  no murmur rubs or gallops Chest: clear to auscultation bilaterally, no wheezing, rales or  rhonchi Abdomen: soft nontender, nondistended, normal bowel sounds, Skin/soft tissue; he has extensive swelling in the left neck extending into the chest with tenderness to palpation, no crepitus heard. He has edematous hand with sausage digits and exquisite tenderness to palpation here. He also has exquisite tenderness in the medial thigh. Scrotum is nnot involved. Left ankle warm and less tender (than he reports days ago) Neuro: nonfocal, strength and sensation intact   Results for orders placed during the hospital encounter of 04/12/12 (from the past 48 hour(s))  IRON AND TIBC     Status: Abnormal   Collection Time   04/15/12  6:00 PM      Component Value Range Comment   Iron 32 (*) 42 - 135 (ug/dL)    TIBC 161 (*) 096 - 435 (ug/dL)    Saturation Ratios 17 (*) 20 - 55 (%)    UIBC 158  125 - 400 (ug/dL)   FERRITIN     Status: Abnormal   Collection Time   04/15/12  6:00 PM      Component Value Range Comment   Ferritin 1211 (*) 22 - 322 (ng/mL)   CBC     Status: Abnormal   Collection Time   04/16/12  5:47 AM      Component Value Range Comment   WBC 15.5 (*) 4.0 - 10.5 (K/uL)    RBC 4.73  4.22 - 5.81 (MIL/uL)    Hemoglobin 10.6 (*) 13.0 - 17.0 (g/dL)    HCT 04.5 (*) 40.9 - 52.0 (%)    MCV 65.5 (*) 78.0 - 100.0 (fL)    MCH 22.4 (*) 26.0 - 34.0 (pg)    MCHC 34.2  30.0 - 36.0 (g/dL)    RDW 81.1  91.4 - 78.2 (%)    Platelets 283  150 - 400 (K/uL)   GLUCOSE, CAPILLARY     Status: Abnormal   Collection Time   04/16/12  8:10 AM      Component Value Range Comment   Glucose-Capillary 117 (*) 70 - 99 (mg/dL)   HEPATIC FUNCTION PANEL     Status: Abnormal   Collection Time   04/16/12  2:51 PM      Component Value Range Comment   Total Protein 6.9  6.0 - 8.3 (g/dL)    Albumin 1.6 (*) 3.5 - 5.2 (g/dL)    AST 97 (*) 0 - 37 (U/L)    ALT 45  0 - 53 (U/L)    Alkaline Phosphatase 191 (*) 39 - 117 (U/L)    Total Bilirubin 0.6  0.3 - 1.2 (mg/dL)    Bilirubin, Direct 0.2  0.0 - 0.3 (mg/dL)     Indirect Bilirubin 0.4  0.3 - 0.9 (mg/dL)   CBC  Status: Abnormal   Collection Time   04/17/12 10:42 AM      Component Value Range Comment   WBC 13.9 (*) 4.0 - 10.5 (K/uL)    RBC 4.98  4.22 - 5.81 (MIL/uL)    Hemoglobin 11.2 (*) 13.0 - 17.0 (g/dL)    HCT 16.1 (*) 09.6 - 52.0 (%)    MCV 67.1 (*) 78.0 - 100.0 (fL)    MCH 22.5 (*) 26.0 - 34.0 (pg)    MCHC 33.5  30.0 - 36.0 (g/dL)    RDW 04.5  40.9 - 81.1 (%)    Platelets 309  150 - 400 (K/uL)   DIFFERENTIAL     Status: Abnormal   Collection Time   04/17/12 10:42 AM      Component Value Range Comment   Neutrophils Relative 74  43 - 77 (%)    Lymphocytes Relative 13  12 - 46 (%)    Monocytes Relative 12  3 - 12 (%)    Eosinophils Relative 1  0 - 5 (%)    Basophils Relative 0  0 - 1 (%)    Neutro Abs 10.3 (*) 1.7 - 7.7 (K/uL)    Lymphs Abs 1.8  0.7 - 4.0 (K/uL)    Monocytes Absolute 1.7 (*) 0.1 - 1.0 (K/uL)    Eosinophils Absolute 0.1  0.0 - 0.7 (K/uL)    Basophils Absolute 0.0  0.0 - 0.1 (K/uL)    RBC Morphology TARGET CELLS      WBC Morphology TOXIC GRANULATION   MILD LEFT SHIFT (1-5% METAS, OCC MYELO, OCC BANDS)   Smear Review        Value: PLATELET CLUMPS NOTED ON SMEAR, COUNT APPEARS ADEQUATE      Component Value Date/Time   SDES BLOOD RIGHT ARM 04/13/2012 0134   SPECREQUEST BOTTLES DRAWN AEROBIC AND ANAEROBIC 10CC EACH 04/13/2012 0134   CULT  Value: GROUP A STREP (S.PYOGENES) ISOLATED Note: CRITICAL RESULT CALLED TO, READ BACK BY AND VERIFIED WITH: RN D. CELANO ON 04/14/12 AT 1050 BY DTERRY Note: Gram Stain Report Called to,Read Back By and Verified With: RN M. TROGDEN ON 04/13/12 AT 1930 BY DTERRY 04/13/2012 0134   REPTSTATUS PENDING 04/13/2012 0134   Ct Soft Tissue Neck W Contrast  04/17/2012  *RADIOLOGY REPORT*  Clinical Data:  Pain and swelling right arm and chest.  Fever and positive blood cultures.  CT NECK AND CHEST WITH CONTRAST  Technique:  Multidetector CT imaging of the neck and chest was performed using the standard  protocol after bolus administration of intravenous contrast.  Contrast: OMNIPAQUE IOHEXOL 300 MG/ML  SOLN  Comparison:   None.  CT NECK  Findings:  Large fluid collection in the right neck with peripheral enhancement  and gas bubbles compatible with abscess.  The largest component of the  abscess is  in the right lower neck at the level of thyroid and  measures 6.4 x 3.0 cm.  Multiple loculations of abscess extending to the right sternoclavicular joint and retrosternal mediastinum.  The mediastinal abscess measures approximately 1.1 x 2.5 cm.  Abscess also extends deep to the pectoralis muscle on the right and extends superiorly along the posterior margin of the sternocleidomastoid muscle on the right. The sternocleidomastoid muscle is enlarged and edematous due to myositis.  No bony destruction is seen involving the sternal clavicular joint to suggest osteomyelitis however  this appears to be  septic arthritis.  Carotid artery and jugular vein are patent bilaterally.  Trachea is deviated to  the left due to mass effect.  No compromise of the airway.  Small cervical lymph nodes are present without pathologic adenopathy.  The thyroid is displaced on the right but not involved.  IMPRESSION: Large mass lesion in the right lower neck.  This extends into the sternal clavicular joint which appears to be involved with infection.  There is extension of abscess into  the superior mediastinum posterior to the manubrium.  There is also abscess extending deep to the pectoralis muscle.  CT CHEST  Findings: Small pleural effusions bilaterally, right greater than left.  Mild bibasilar atelectasis.  No definite pneumonia or lung abscess.  Negative for pericardial effusion.  Abscess extends posterior to the manubrium compatible with mediastinitis and abscess related to neck infection as described in the above report.  Imaging of the upper abdomen reveal distended gallbladder with fluid surrounding the gallbladder.  This may be  due to cholecystitis.  Consider ultrasound is symptomatic.  IMPRESSION: Bilateral pleural effusions and bibasilar atelectasis.  Mediastinitis with abscess extending from the right neck into the superior mediastinum.  Pericholecystic fluid, suggestive of cholecystitis.  I discussed the findings by telephone with Algis Downs PA on 04/17/2012 at 1400 hours.  Original Report Authenticated By: Camelia Phenes, M.D.   Ct Chest W Contrast  04/17/2012  *RADIOLOGY REPORT*  Clinical Data:  Pain and swelling right arm and chest.  Fever and positive blood cultures.  CT NECK AND CHEST WITH CONTRAST  Technique:  Multidetector CT imaging of the neck and chest was performed using the standard protocol after bolus administration of intravenous contrast.  Contrast: OMNIPAQUE IOHEXOL 300 MG/ML  SOLN  Comparison:   None.  CT NECK  Findings:  Large fluid collection in the right neck with peripheral enhancement  and gas bubbles compatible with abscess.  The largest component of the  abscess is  in the right lower neck at the level of thyroid and  measures 6.4 x 3.0 cm.  Multiple loculations of abscess extending to the right sternoclavicular joint and retrosternal mediastinum.  The mediastinal abscess measures approximately 1.1 x 2.5 cm.  Abscess also extends deep to the pectoralis muscle on the right and extends superiorly along the posterior margin of the sternocleidomastoid muscle on the right. The sternocleidomastoid muscle is enlarged and edematous due to myositis.  No bony destruction is seen involving the sternal clavicular joint to suggest osteomyelitis however  this appears to be  septic arthritis.  Carotid artery and jugular vein are patent bilaterally.  Trachea is deviated to the left due to mass effect.  No compromise of the airway.  Small cervical lymph nodes are present without pathologic adenopathy.  The thyroid is displaced on the right but not involved.  IMPRESSION: Large mass lesion in the right lower neck.   This extends into the sternal clavicular joint which appears to be involved with infection.  There is extension of abscess into  the superior mediastinum posterior to the manubrium.  There is also abscess extending deep to the pectoralis muscle.  CT CHEST  Findings: Small pleural effusions bilaterally, right greater than left.  Mild bibasilar atelectasis.  No definite pneumonia or lung abscess.  Negative for pericardial effusion.  Abscess extends posterior to the manubrium compatible with mediastinitis and abscess related to neck infection as described in the above report.  Imaging of the upper abdomen reveal distended gallbladder with fluid surrounding the gallbladder.  This may be due to cholecystitis.  Consider ultrasound is symptomatic.  IMPRESSION: Bilateral pleural effusions  and bibasilar atelectasis.  Mediastinitis with abscess extending from the right neck into the superior mediastinum.  Pericholecystic fluid, suggestive of cholecystitis.  I discussed the findings by telephone with Algis Downs PA on 04/17/2012 at 1400 hours.  Original Report Authenticated By: Camelia Phenes, M.D.     Recent Results (from the past 720 hour(s))  CULTURE, BLOOD (ROUTINE X 2)     Status: Normal (Preliminary result)   Collection Time   04/13/12  1:28 AM      Component Value Range Status Comment   Specimen Description BLOOD RIGHT HAND   Final    Special Requests BOTTLES DRAWN AEROBIC AND ANAEROBIC [facility] EACH   Final    Culture  Setup Time [id number]   Final    Culture     Final    Value: GROUP A STREP (S.PYOGENES) ISOLATED     Note: CRITICAL RESULT CALLED TO, READ BACK BY AND VERIFIED WITH: RN D. Ronette Deter ON 04/14/12 AT 1050 BY DTERRY     Note: Gram Stain Report Called to,Read Back By and Verified With: RN M. TROGDEN ON 04/13/12 AT 1930 BY DTERRY   Report Status PENDING   Incomplete   CULTURE, BLOOD (ROUTINE X 2)     Status: Normal (Preliminary result)   Collection Time   04/13/12  1:34 AM      Component Value  Range Status Comment   Specimen Description BLOOD RIGHT ARM   Final    Special Requests BOTTLES DRAWN AEROBIC AND ANAEROBIC 10CC EACH   Final    Culture  Setup Time [id number]   Final    Culture     Final    Value: GROUP A STREP (S.PYOGENES) ISOLATED     Note: CRITICAL RESULT CALLED TO, READ BACK BY AND VERIFIED WITH: RN D. CELANO ON 04/14/12 AT 1050 BY DTERRY     Note: Gram Stain Report Called to,Read Back By and Verified With: RN M. TROGDEN ON 04/13/12 AT 1930 BY DTERRY   Report Status PENDING   Incomplete      Impression/Recommendation  1) SEVERE GROUP A STREPTOCOCCAL INFECTION WITH BACTEREMIA AND MX METASATIC FOCI OF INFECTION: Focus if infection appears to be large abscess in neck that extends into the [place] joint and behind the manubrium with metastatic spread to left hand, wrist, left thigh and left ankle  --SWITCH TO UNASYN AND CLINDAMYCIN IN CASE THERE ARE TOXIN ELABORATING PATHOGENS IN THE ABSCESS IN NECK --URGENT CONSULTS TO CVTS AND ENT--THEIR SURGERY ON NECK AND MEDIASTINUM IS MOST URGENT ONE  --AGREE WITH IMAGING THIGH TODAY GIVEN PAIN  --HE WILL UNDOUBTEDLY NEED SURGERY OF HAND, POSSIBLY THIGH AND LEFT ANKLE WILL NEED TO CONSIDER ASPIRATING FLUID FROM WRIST JOINT, ANKLE VS MORE SENSITIVE IMAGING SUCH AS MRI  --REPEAT BLOOD CULTURES --WILL NEEED PROTRACTED ANTIBIOTICS --  2) ? Cholecystitis: is this incidental finding or related to his systemic disease He is broadly covered for usual suspects in this area  3) screening: Check hiv, hep serologies. Not admitted to in hx but given extensive infeciton and [place] jiont involvment ? iVDU (not documented)  Thank you so much for this interesting consult,   Acey Lav 04/17/2012, 3:33 PM   [phone] (pager) [phone] (office)

## 2012-04-17 NOTE — Consult Note (Signed)
Reason for Consult:left psoas fluid collection  Referring Physician: ENT [name] is an 51 y.o. male.  HPI: Admitted for sepsis UE cellulitis and neck abscess currently undergoing I&D by ENT. Group B strep cultured. Noted fluid collection left Psoas. Called for evaluation in AM.  History reviewed. No pertinent past medical history.  Past Surgical History  Procedure Date  . Back surgery     No family history on file.  Social History:  reports that he has never smoked. He has never used smokeless tobacco. He reports that he drinks about 1.5 ounces of alcohol per week. He reports that he does not use illicit drugs.  Allergies: No Known Allergies  Medications: Reviewed  Results for orders placed during the hospital encounter of 04/12/12 (from the past 48 hour(s))  CBC     Status: Abnormal   Collection Time   04/16/12  5:47 AM      Component Value Range Comment   WBC 15.5 (*) 4.0 - 10.5 (K/uL)    RBC 4.73  4.22 - 5.81 (MIL/uL)    Hemoglobin 10.6 (*) 13.0 - 17.0 (g/dL)    HCT 57.8 (*) 46.9 - 52.0 (%)    MCV 65.5 (*) 78.0 - 100.0 (fL)    MCH 22.4 (*) 26.0 - 34.0 (pg)    MCHC 34.2  30.0 - 36.0 (g/dL)    RDW 62.9  52.8 - 41.3 (%)    Platelets 283  150 - 400 (K/uL)   GLUCOSE, CAPILLARY     Status: Abnormal   Collection Time   04/16/12  8:10 AM      Component Value Range Comment   Glucose-Capillary 117 (*) 70 - 99 (mg/dL)   HEPATIC FUNCTION PANEL     Status: Abnormal   Collection Time   04/16/12  2:51 PM      Component Value Range Comment   Total Protein 6.9  6.0 - 8.3 (g/dL)    Albumin 1.6 (*) 3.5 - 5.2 (g/dL)    AST 97 (*) 0 - 37 (U/L)    ALT 45  0 - 53 (U/L)    Alkaline Phosphatase 191 (*) 39 - 117 (U/L)    Total Bilirubin 0.6  0.3 - 1.2 (mg/dL)    Bilirubin, Direct 0.2  0.0 - 0.3 (mg/dL)    Indirect Bilirubin 0.4  0.3 - 0.9 (mg/dL)   CBC     Status: Abnormal   Collection Time   04/17/12 10:42 AM      Component Value Range Comment   WBC 13.9 (*) 4.0 - 10.5 (K/uL)    RBC 4.98  4.22 - 5.81 (MIL/uL)    Hemoglobin 11.2 (*) 13.0 - 17.0 (g/dL)    HCT 24.4 (*) 01.0 - 52.0 (%)    MCV 67.1 (*) 78.0 - 100.0 (fL)    MCH 22.5 (*) 26.0 - 34.0 (pg)    MCHC 33.5  30.0 - 36.0 (g/dL)    RDW 27.2  53.6 - 64.4 (%)    Platelets 309  150 - 400 (K/uL)   DIFFERENTIAL     Status: Abnormal   Collection Time   04/17/12 10:42 AM      Component Value Range Comment   Neutrophils Relative 74  43 - 77 (%)    Lymphocytes Relative 13  12 - 46 (%)    Monocytes Relative 12  3 - 12 (%)    Eosinophils Relative 1  0 - 5 (%)    Basophils Relative 0  0 - 1 (%)  Neutro Abs 10.3 (*) 1.7 - 7.7 (K/uL)    Lymphs Abs 1.8  0.7 - 4.0 (K/uL)    Monocytes Absolute 1.7 (*) 0.1 - 1.0 (K/uL)    Eosinophils Absolute 0.1  0.0 - 0.7 (K/uL)    Basophils Absolute 0.0  0.0 - 0.1 (K/uL)    RBC Morphology TARGET CELLS      WBC Morphology TOXIC GRANULATION   MILD LEFT SHIFT (1-5% METAS, OCC MYELO, OCC BANDS)   Smear Review        Value: PLATELET CLUMPS NOTED ON SMEAR, COUNT APPEARS ADEQUATE  PROTIME-INR     Status: Abnormal   Collection Time   04/17/12  4:50 PM      Component Value Range Comment   Prothrombin Time 15.3 (*) 11.6 - 15.2 (seconds)    INR 1.18  0.00 - 1.49    TYPE AND SCREEN     Status: Normal   Collection Time   04/17/12  4:50 PM      Component Value Range Comment   ABO/RH(D) O POS      Antibody Screen NEG      Sample Expiration 04/20/2012     SURGICAL PCR SCREEN     Status: Normal   Collection Time   04/17/12  9:32 PM      Component Value Range Comment   MRSA, PCR NEGATIVE  NEGATIVE     Staphylococcus aureus NEGATIVE  NEGATIVE      Ct Abdomen Pelvis Wo Contrast  04/17/2012  *RADIOLOGY REPORT*  Clinical Data: Left groin pain  CT ABDOMEN AND PELVIS WITHOUT CONTRAST  Technique:  Multidetector CT imaging of the abdomen and pelvis was performed following the standard protocol without intravenous contrast.  Comparison: None.  Findings: Bilateral pleural effusions are noted with  associated compressive atelectasis.  Small pericardial effusion is present.  Unenhanced CT was performed per clinician order.  Lack of IV contrast limits sensitivity and specificity, especially for evaluation of abdominal/pelvic solid viscera.  There is minimal retained contrast within nondilated renal collecting systems from contrast enhanced exam performed separately today.  There is minimal nonspecific peri nephric stranding bilaterally, right greater than left.  Hyperdense area of the peripheral renal cortex of the right upper renal pole image 32 measures 6 mm.  Unenhanced liver, spleen, pancreas, and adrenal glands are normal.  No hydronephrosis.  The bowel is grossly normal.  Bladder is normal.  There is fusiform enlargement and inhomogeneity of the central hypodensity involving the left iliopsoas muscle.  The more focal well-defined area of hypodensity in the inferior portion of this muscle, overlying the region of the left femoral head, measures 2.8 x 2.0 cm.  A small amount of fluid is noted tracking along the interfascial space of the upper left quadriceps muscles.  Mild subcutaneous edema is present.  No lymphadenopathy.  Lumbar fusion hardware again noted.  IMPRESSION: Fusiform enlargement and hypodensity of the left iliopsoas muscle. Differential considerations include abscess, sterile fluid collection, or hematoma.  Findings discussed with Dr. Lazarus Salines by Dr. Chilton Si 04/17/12 825pm.  Small bilateral pleural effusions and associated compressive atelectasis.  Small pericardial effusion.  Original Report Authenticated By: Harrel Lemon, M.D.   Ct Soft Tissue Neck W Contrast  04/17/2012  *RADIOLOGY REPORT*  Clinical Data:  Pain and swelling right arm and chest.  Fever and positive blood cultures.  CT NECK AND CHEST WITH CONTRAST  Technique:  Multidetector CT imaging of the neck and chest was performed using the standard protocol after bolus administration of  intravenous contrast.  Contrast:  OMNIPAQUE IOHEXOL 300 MG/ML  SOLN  Comparison:   None.  CT NECK  Findings:  Large fluid collection in the right neck with peripheral enhancement  and gas bubbles compatible with abscess.  The largest component of the  abscess is  in the right lower neck at the level of thyroid and  measures 6.4 x 3.0 cm.  Multiple loculations of abscess extending to the right sternoclavicular joint and retrosternal mediastinum.  The mediastinal abscess measures approximately 1.1 x 2.5 cm.  Abscess also extends deep to the pectoralis muscle on the right and extends superiorly along the posterior margin of the sternocleidomastoid muscle on the right. The sternocleidomastoid muscle is enlarged and edematous due to myositis.  No bony destruction is seen involving the sternal clavicular joint to suggest osteomyelitis however  this appears to be  septic arthritis.  Carotid artery and jugular vein are patent bilaterally.  Trachea is deviated to the left due to mass effect.  No compromise of the airway.  Small cervical lymph nodes are present without pathologic adenopathy.  The thyroid is displaced on the right but not involved.  IMPRESSION: Large mass lesion in the right lower neck.  This extends into the sternal clavicular joint which appears to be involved with infection.  There is extension of abscess into  the superior mediastinum posterior to the manubrium.  There is also abscess extending deep to the pectoralis muscle.  CT CHEST  Findings: Small pleural effusions bilaterally, right greater than left.  Mild bibasilar atelectasis.  No definite pneumonia or lung abscess.  Negative for pericardial effusion.  Abscess extends posterior to the manubrium compatible with mediastinitis and abscess related to neck infection as described in the above report.  Imaging of the upper abdomen reveal distended gallbladder with fluid surrounding the gallbladder.  This may be due to cholecystitis.  Consider ultrasound is symptomatic.  IMPRESSION:  Bilateral pleural effusions and bibasilar atelectasis.  Mediastinitis with abscess extending from the right neck into the superior mediastinum.  Pericholecystic fluid, suggestive of cholecystitis.  I discussed the findings by telephone with Algis Downs PA on 04/17/2012 at 1400 hours.  Original Report Authenticated By: Camelia Phenes, M.D.   Ct Chest W Contrast  04/17/2012  *RADIOLOGY REPORT*  Clinical Data:  Pain and swelling right arm and chest.  Fever and positive blood cultures.  CT NECK AND CHEST WITH CONTRAST  Technique:  Multidetector CT imaging of the neck and chest was performed using the standard protocol after bolus administration of intravenous contrast.  Contrast: OMNIPAQUE IOHEXOL 300 MG/ML  SOLN  Comparison:   None.  CT NECK  Findings:  Large fluid collection in the right neck with peripheral enhancement  and gas bubbles compatible with abscess.  The largest component of the  abscess is  in the right lower neck at the level of thyroid and  measures 6.4 x 3.0 cm.  Multiple loculations of abscess extending to the right sternoclavicular joint and retrosternal mediastinum.  The mediastinal abscess measures approximately 1.1 x 2.5 cm.  Abscess also extends deep to the pectoralis muscle on the right and extends superiorly along the posterior margin of the sternocleidomastoid muscle on the right. The sternocleidomastoid muscle is enlarged and edematous due to myositis.  No bony destruction is seen involving the sternal clavicular joint to suggest osteomyelitis however  this appears to be  septic arthritis.  Carotid artery and jugular vein are patent bilaterally.  Trachea is deviated to the left due  to mass effect.  No compromise of the airway.  Small cervical lymph nodes are present without pathologic adenopathy.  The thyroid is displaced on the right but not involved.  IMPRESSION: Large mass lesion in the right lower neck.  This extends into the sternal clavicular joint which appears to be  involved with infection.  There is extension of abscess into  the superior mediastinum posterior to the manubrium.  There is also abscess extending deep to the pectoralis muscle.  CT CHEST  Findings: Small pleural effusions bilaterally, right greater than left.  Mild bibasilar atelectasis.  No definite pneumonia or lung abscess.  Negative for pericardial effusion.  Abscess extends posterior to the manubrium compatible with mediastinitis and abscess related to neck infection as described in the above report.  Imaging of the upper abdomen reveal distended gallbladder with fluid surrounding the gallbladder.  This may be due to cholecystitis.  Consider ultrasound is symptomatic.  IMPRESSION: Bilateral pleural effusions and bibasilar atelectasis.  Mediastinitis with abscess extending from the right neck into the superior mediastinum.  Pericholecystic fluid, suggestive of cholecystitis.  I discussed the findings by telephone with Algis Downs PA on 04/17/2012 at 1400 hours.  Original Report Authenticated By: Camelia Phenes, M.D.    Review of Systems  Constitutional: Positive for fever, chills and malaise/fatigue.  HENT: Positive for sore throat and neck pain.   Eyes: Negative.  Negative for blurred vision.  Respiratory: Negative.   Cardiovascular: Positive for chest pain.  Gastrointestinal: Positive for nausea.  Genitourinary: Negative.   Musculoskeletal: Positive for joint pain.  Neurological: Positive for weakness.  Endo/Heme/Allergies: Negative.   Psychiatric/Behavioral: Negative.   All other systems reviewed and are negative.   Blood pressure 154/101, pulse 101, temperature 99.6 F (37.6 C), temperature source Oral, resp. rate 22, height 5\' 4"  (1.626 m), weight 61 kg (134 lb 7.7 oz), SpO2 94.00%. Physical Exam  Vitals reviewed.  Patient currently in OR   Assessment/Plan: Small fluid collection 2.8x2.0 cm  left psoas muscle anterior to hip joint. Discussed with Dr. Andria Meuse radiology.  Differential included hematoma, bursa fluid. No definitive abscess without gas formation as noted in the neck, however given the clinical setting would aspirate and place drain under [place] or CT. Have placed consult with interventional radiology. Will follow.   [name] C [id number]. Cell [phone].  04/17/2012, 11:23 PM

## 2012-04-17 NOTE — Consult Note (Signed)
Juriel, Cid [id number] [birth year]  Reason for Consult:  RIGHT Deep Neck Infection Requesting Physician:  Maretta Bees, MD   HPI:  51 yo bm developed sore neck 10-12 days ago.  Shortly thereafter, developed sore throat.  Was seen in [facility] ER.  No antibiotics given.  Subsequently developed pain in his Left arm and hand.  Evaluated by Dr. Melvyn Novas.  Felt not to need surgical drainage.  Also LEFT thigh/groin pain which today was diagnosed by CT scan as an intra muscular ilio-psoas abscess/necrosis.  CT neck today shows large abscess with multiple loculations RIGHT lower neck, upper mediastinum, also involving the sterno-clavicular joint, and with loculations deep to the RIGHT pec major muscle. Positive for gas formation.  Blood cultures grew Group A Strep pyogenes.   ENT called for assistance with deep neck abscess  ROS:  Negative except as in HPI.  PMHx:  History reviewed. No pertinent past medical history.  ALLERGIES:  No Known Allergies  MEDS:   No current facility-administered medications on file prior to encounter.   Current Outpatient Prescriptions on File Prior to Encounter  Medication Sig Dispense Refill  . Aspirin-Acetaminophen (GOODYS BODY PAIN PO) Take 1 Package by mouth daily as needed. For pain      . naproxen sodium (ANAPROX) 220 MG tablet Take 440 mg by mouth daily as needed. For pain      . oxyCODONE-acetaminophen (PERCOCET) 5-325 MG per tablet Take 1 tablet by mouth every 6 (six) hours as needed for pain.  30 tablet  0    PE;   BP 154/101  Pulse 101  Temp(Src) 99.6 F (37.6 C) (Oral)  Resp 22  Ht 5\' 4"  (1.626 m)  Wt 61 kg (134 lb 7.7 oz)  BMI 23.08 kg/m2  SpO2 94%   Alert, distressed middle aged bm.  Voice very slightly raspy, no stridor or labored respirations.  Not warm to touch.  Ears cl.  Ant nose dry and slightly crusted.  OC moist with teeth in good repair.  OP with some erythema of the uvula/soft palate.  No peri tonsillar abscess seen.  Neck with  indurated/tender erythematous swelling of the low RIGHT neck onto the chest wall.  No crepitus.  No sharp demarcation.  Ct Abdomen Pelvis Wo Contrast  04/17/2012  *RADIOLOGY REPORT*  Clinical Data: Left groin pain  CT ABDOMEN AND PELVIS WITHOUT CONTRAST  Technique:  Multidetector CT imaging of the abdomen and pelvis was performed following the standard protocol without intravenous contrast.  Comparison: None.  Findings: Bilateral pleural effusions are noted with associated compressive atelectasis.  Small pericardial effusion is present.  Unenhanced CT was performed per clinician order.  Lack of IV contrast limits sensitivity and specificity, especially for evaluation of abdominal/pelvic solid viscera.  There is minimal retained contrast within nondilated renal collecting systems from contrast enhanced exam performed separately today.  There is minimal nonspecific peri nephric stranding bilaterally, right greater than left.  Hyperdense area of the peripheral renal cortex of the right upper renal pole image 32 measures 6 mm.  Unenhanced liver, spleen, pancreas, and adrenal glands are normal.  No hydronephrosis.  The bowel is grossly normal.  Bladder is normal.  There is fusiform enlargement and inhomogeneity of the central hypodensity involving the left iliopsoas muscle.  The more focal well-defined area of hypodensity in the inferior portion of this muscle, overlying the region of the left femoral head, measures 2.8 x 2.0 cm.  A small amount of fluid is noted tracking along the interfascial space  of the upper left quadriceps muscles.  Mild subcutaneous edema is present.  No lymphadenopathy.  Lumbar fusion hardware again noted.  IMPRESSION: Fusiform enlargement and hypodensity of the left iliopsoas muscle. Differential considerations include abscess, sterile fluid collection, or hematoma.  Findings discussed with Dr. Lazarus Salines by Dr. Chilton Si 04/17/12 825pm.  Small bilateral pleural effusions and associated compressive  atelectasis.  Small pericardial effusion.  Original Report Authenticated By: Harrel Lemon, M.D.   Dg Chest 2 View  04/08/2012  *RADIOLOGY REPORT*  Clinical Data: Left-sided pain  CHEST - 2 VIEW  Comparison: 11/28/2006  Findings: Heart size is normal.  Mediastinal shadows are normal. Lungs are clear.  No effusions.  No bony abnormalities.  IMPRESSION: Normal chest  Original Report Authenticated By: Thomasenia Sales, M.D.   Ct Soft Tissue Neck W Contrast  04/17/2012  *RADIOLOGY REPORT*  Clinical Data:  Pain and swelling right arm and chest.  Fever and positive blood cultures.  CT NECK AND CHEST WITH CONTRAST  Technique:  Multidetector CT imaging of the neck and chest was performed using the standard protocol after bolus administration of intravenous contrast.  Contrast: OMNIPAQUE IOHEXOL 300 MG/ML  SOLN  Comparison:   None.  CT NECK  Findings:  Large fluid collection in the right neck with peripheral enhancement  and gas bubbles compatible with abscess.  The largest component of the  abscess is  in the right lower neck at the level of thyroid and  measures 6.4 x 3.0 cm.  Multiple loculations of abscess extending to the right sternoclavicular joint and retrosternal mediastinum.  The mediastinal abscess measures approximately 1.1 x 2.5 cm.  Abscess also extends deep to the pectoralis muscle on the right and extends superiorly along the posterior margin of the sternocleidomastoid muscle on the right. The sternocleidomastoid muscle is enlarged and edematous due to myositis.  No bony destruction is seen involving the sternal clavicular joint to suggest osteomyelitis however  this appears to be  septic arthritis.  Carotid artery and jugular vein are patent bilaterally.  Trachea is deviated to the left due to mass effect.  No compromise of the airway.  Small cervical lymph nodes are present without pathologic adenopathy.  The thyroid is displaced on the right but not involved.  IMPRESSION: Large mass lesion in  the right lower neck.  This extends into the sternal clavicular joint which appears to be involved with infection.  There is extension of abscess into  the superior mediastinum posterior to the manubrium.  There is also abscess extending deep to the pectoralis muscle.  CT CHEST  Findings: Small pleural effusions bilaterally, right greater than left.  Mild bibasilar atelectasis.  No definite pneumonia or lung abscess.  Negative for pericardial effusion.  Abscess extends posterior to the manubrium compatible with mediastinitis and abscess related to neck infection as described in the above report.  Imaging of the upper abdomen reveal distended gallbladder with fluid surrounding the gallbladder.  This may be due to cholecystitis.  Consider ultrasound is symptomatic.  IMPRESSION: Bilateral pleural effusions and bibasilar atelectasis.  Mediastinitis with abscess extending from the right neck into the superior mediastinum.  Pericholecystic fluid, suggestive of cholecystitis.  I discussed the findings by telephone with Algis Downs PA on 04/17/2012 at 1400 hours.  Original Report Authenticated By: Camelia Phenes, M.D.   Ct Chest W Contrast  04/17/2012  *RADIOLOGY REPORT*  Clinical Data:  Pain and swelling right arm and chest.  Fever and positive blood cultures.  CT NECK AND  CHEST WITH CONTRAST  Technique:  Multidetector CT imaging of the neck and chest was performed using the standard protocol after bolus administration of intravenous contrast.  Contrast: OMNIPAQUE IOHEXOL 300 MG/ML  SOLN  Comparison:   None.  CT NECK  Findings:  Large fluid collection in the right neck with peripheral enhancement  and gas bubbles compatible with abscess.  The largest component of the  abscess is  in the right lower neck at the level of thyroid and  measures 6.4 x 3.0 cm.  Multiple loculations of abscess extending to the right sternoclavicular joint and retrosternal mediastinum.  The mediastinal abscess measures approximately 1.1  x 2.5 cm.  Abscess also extends deep to the pectoralis muscle on the right and extends superiorly along the posterior margin of the sternocleidomastoid muscle on the right. The sternocleidomastoid muscle is enlarged and edematous due to myositis.  No bony destruction is seen involving the sternal clavicular joint to suggest osteomyelitis however  this appears to be  septic arthritis.  Carotid artery and jugular vein are patent bilaterally.  Trachea is deviated to the left due to mass effect.  No compromise of the airway.  Small cervical lymph nodes are present without pathologic adenopathy.  The thyroid is displaced on the right but not involved.  IMPRESSION: Large mass lesion in the right lower neck.  This extends into the sternal clavicular joint which appears to be involved with infection.  There is extension of abscess into  the superior mediastinum posterior to the manubrium.  There is also abscess extending deep to the pectoralis muscle.  CT CHEST  Findings: Small pleural effusions bilaterally, right greater than left.  Mild bibasilar atelectasis.  No definite pneumonia or lung abscess.  Negative for pericardial effusion.  Abscess extends posterior to the manubrium compatible with mediastinitis and abscess related to neck infection as described in the above report.  Imaging of the upper abdomen reveal distended gallbladder with fluid surrounding the gallbladder.  This may be due to cholecystitis.  Consider ultrasound is symptomatic.  IMPRESSION: Bilateral pleural effusions and bibasilar atelectasis.  Mediastinitis with abscess extending from the right neck into the superior mediastinum.  Pericholecystic fluid, suggestive of cholecystitis.  I discussed the findings by telephone with Algis Downs PA on 04/17/2012 at 1400 hours.  Original Report Authenticated By: Camelia Phenes, M.D.   Ct Hand Left W Contrast  04/13/2012  *RADIOLOGY REPORT*  Clinical Data: Pain, swelling, and erythema of the left forearm and  hand.  CT OF THE LEFT HAND WITH CONTRAST  Technique:  Multidetector CT imaging was performed following the standard protocol during bolus administration of intravenous contrast.  Contrast: OMNIPAQUE IOHEXOL 300 MG/ML  SOLN  Comparison: Plain radiographs 04/12/2012  Findings: Diffuse low attenuation change throughout the intramuscular planes and subcutaneous soft tissues of the distal forearm and wrist.  Not loculated appearing fluid collection deep to the distal flexor or muscle and tendon compartment.  Changes are likely represent cellulitis, likely with tenosynovitis and edema. No discrete abscess cavity is demonstrated although focal abscess may be obscured.  MRI is suggested for more complete evaluation. Visualized bones appear intact.  There is no obvious fracture or bone lesion.  No cortical erosion or periosteal reaction to suggest osteomyelitis.  Degenerative changes.  IMPRESSION: Diffuse edema in the soft tissues and intramuscular planes of the distal forearm and wrist.  Volar fluid collection at the distal flexor tendons and muscles likely represents diffuse edema.  No definite localized abscess is demonstrated.  Consider MRI for better evaluation of these changes.  Original Report Authenticated By: Marlon Pel, M.D.   Dg Hand Complete Left  04/12/2012  *RADIOLOGY REPORT*  Clinical Data: Left hand pain, swelling, and tenderness.  No known injury.  LEFT HAND - COMPLETE 3+ VIEW  Comparison: None.  Findings: Diffuse soft tissue swelling.  No radiopaque foreign bodies or gas collections in the subcutaneous tissues.  Bones appear intact without evidence of acute fracture or subluxation. Benign-appearing sclerosis in the distal phalanx of the fourth finger.  No destructive bone lesions or expansile lesions demonstrated.  Degenerative changes in the STT, first metacarpal phalangeal, and multiple interphalangeal joints.  No abnormal periosteal reaction.  Bone cortex and trabecular architecture  appear intact.  IMPRESSION: Diffuse soft tissue swelling.  No acute bony abnormalities demonstrated.  Degenerative changes.  Benign-appearing bone sclerosis in the distal phalanx of the fourth finger.  Original Report Authenticated By: Marlon Pel, M.D.     IMPRESSION:  Deep neck infection, RIGHT, with early mediastinal spread and involvement deep to the RIGHT pec major muscle.  ? Metastatic infections involving LEFT hand and LEFT thigh.  PLAN:   DL, Esoph, RIGHT neck exploration/ I&D.  Cardiac u/s possibly completed today.  ID consult.  Orthopedic consult called. IV antibiosis.  Will get aerobic and anaerobic cultures in OR.      Flo Shanks 04/17/2012, 11:07 PM

## 2012-04-17 NOTE — Progress Notes (Signed)
   CARE MANAGEMENT NOTE 04/17/2012  Patient:  [name], [name]   Account Number:  [id number]  Date Initiated:  04/16/2012  Documentation initiated by:  Darlyne Russian  Subjective/Objective Assessment:   Patient admitted with cellulitis     Action/Plan:   Progression of care and discharge planning  04/17/12- PT/OT evals   Anticipated DC Date:  04/19/2012   Anticipated DC Plan:  HOME/SELF CARE      DC Planning Services  CM consult  GCCN / P4HM (established/new)      Choice offered to / List presented to:             Status of service:  In process, will continue to follow Medicare Important Message given?   (If response is "NO", the following Medicare IM given date fields will be blank) Date Medicare IM given:   Date Additional Medicare IM given:    Discharge Disposition:    Per UR Regulation:  Reviewed for med. necessity/level of care/duration of stay  If discussed at Long Length of Stay Meetings, dates discussed:    Comments:  04/17/12- 1200- Donn Pierini RN, BSN [phone] Spoke with pt at bedside- per conversation pt states he lives at home with wife- independent with ADLs. Pt does not work, wife does- there are no children in the home. Pt reports that he uses [facility] for medications. Pt states that he does not go to any of the clinics but is open to being set up with a clinic. Discussed [facility]- pt stated that he would not be able to come up with the $45 fee to see the doctor at [facility]. Discussed [facility]- pt agreeable to referral to [facility]- call made to Johney Frame and referral faxed. [facility] and will continue to follow to offer assistance- pt is not eligible for Medicaid and at this time is not disabled. Spoke with pharmacy and pt is eligible for indigent fund for medication assistance if needed at discharge. CM to continue to follow.

## 2012-04-18 ENCOUNTER — Encounter (HOSPITAL_COMMUNITY): Payer: Self-pay | Admitting: Anesthesiology

## 2012-04-18 ENCOUNTER — Encounter (HOSPITAL_COMMUNITY)
Admission: EM | Disposition: A | Discharge status: Home / Self Care | Payer: Self-pay | Source: Home / Self Care | Attending: Internal Medicine

## 2012-04-18 ENCOUNTER — Inpatient Hospital Stay (HOSPITAL_COMMUNITY): Payer: Medicaid Other | Admitting: Anesthesiology

## 2012-04-18 ENCOUNTER — Inpatient Hospital Stay (HOSPITAL_COMMUNITY): Payer: Medicaid Other

## 2012-04-18 ENCOUNTER — Encounter (HOSPITAL_COMMUNITY): Payer: Self-pay | Admitting: Radiology

## 2012-04-18 DIAGNOSIS — E876 Hypokalemia: Secondary | ICD-10-CM | POA: Diagnosis not present

## 2012-04-18 DIAGNOSIS — D649 Anemia, unspecified: Secondary | ICD-10-CM | POA: Diagnosis present

## 2012-04-18 DIAGNOSIS — R6510 Systemic inflammatory response syndrome (SIRS) of non-infectious origin without acute organ dysfunction: Secondary | ICD-10-CM | POA: Diagnosis present

## 2012-04-18 DIAGNOSIS — B955 Unspecified streptococcus as the cause of diseases classified elsewhere: Secondary | ICD-10-CM | POA: Diagnosis present

## 2012-04-18 DIAGNOSIS — M25471 Effusion, right ankle: Secondary | ICD-10-CM | POA: Diagnosis present

## 2012-04-18 DIAGNOSIS — L0211 Cutaneous abscess of neck: Secondary | ICD-10-CM | POA: Diagnosis present

## 2012-04-18 DIAGNOSIS — J9601 Acute respiratory failure with hypoxia: Secondary | ICD-10-CM | POA: Diagnosis present

## 2012-04-18 DIAGNOSIS — D72829 Elevated white blood cell count, unspecified: Secondary | ICD-10-CM | POA: Diagnosis present

## 2012-04-18 DIAGNOSIS — K6812 Psoas muscle abscess: Secondary | ICD-10-CM | POA: Diagnosis present

## 2012-04-18 DIAGNOSIS — J90 Pleural effusion, not elsewhere classified: Secondary | ICD-10-CM | POA: Diagnosis present

## 2012-04-18 HISTORY — PX: I&D EXTREMITY: SHX5045

## 2012-04-18 LAB — COMPREHENSIVE METABOLIC PANEL
ALT: 33 U/L (ref 0–53)
Albumin: 1.4 g/dL — ABNORMAL LOW (ref 3.5–5.2)
Alkaline Phosphatase: 149 U/L — ABNORMAL HIGH (ref 39–117)
BUN: 9 mg/dL (ref 6–23)
CO2: 19 mEq/L (ref 19–32)
Calcium: 8.1 mg/dL — ABNORMAL LOW (ref 8.4–10.5)
Chloride: 98 mEq/L (ref 96–112)
Creatinine, Ser: 0.64 mg/dL (ref 0.50–1.35)

## 2012-04-18 LAB — CBC
HCT: 33.5 % — ABNORMAL LOW (ref 39.0–52.0)
MCH: 22.3 pg — ABNORMAL LOW (ref 26.0–34.0)
MCV: 66.1 fL — ABNORMAL LOW (ref 78.0–100.0)
Platelets: 221 10*3/uL (ref 150–400)
RBC: 5.07 MIL/uL (ref 4.22–5.81)

## 2012-04-18 LAB — GRAM STAIN

## 2012-04-18 LAB — HIV ANTIBODY (ROUTINE TESTING W REFLEX): HIV: NONREACTIVE

## 2012-04-18 SURGERY — IRRIGATION AND DEBRIDEMENT EXTREMITY
Anesthesia: General | Site: Wrist | Laterality: Left | Wound class: Dirty or Infected

## 2012-04-18 MED ORDER — ONDANSETRON HCL 4 MG/2ML IJ SOLN: 4.0000 mg | Freq: Once | INTRAMUSCULAR | Status: DC | PRN

## 2012-04-18 MED ORDER — ONDANSETRON HCL 4 MG/2ML IJ SOLN
INTRAMUSCULAR | Status: DC | PRN
  Administered 2012-04-18: 4 mg via INTRAVENOUS

## 2012-04-18 MED ORDER — PHENOL 1.4 % MT LIQD: 1.0000 | OROMUCOSAL | Status: DC | PRN

## 2012-04-18 MED ORDER — 0.9 % SODIUM CHLORIDE (POUR BTL) OPTIME
TOPICAL | Status: DC | PRN
  Administered 2012-04-18: 1000 mL

## 2012-04-18 MED ORDER — DIPHENHYDRAMINE HCL 50 MG/ML IJ SOLN: 12.5000 mg | Freq: Four times a day (QID) | INTRAMUSCULAR | Status: DC | PRN

## 2012-04-18 MED ORDER — ONDANSETRON HCL 4 MG/2ML IJ SOLN: 4.0000 mg | Freq: Four times a day (QID) | INTRAMUSCULAR | Status: DC | PRN

## 2012-04-18 MED ORDER — SODIUM CHLORIDE 0.9 % IJ SOLN: 9.0000 mL | INTRAMUSCULAR | Status: DC | PRN

## 2012-04-18 MED ORDER — NALOXONE HCL 0.4 MG/ML IJ SOLN: 0.4000 mg | INTRAMUSCULAR | Status: DC | PRN

## 2012-04-18 MED ORDER — DIPHENHYDRAMINE HCL 12.5 MG/5ML PO ELIX
12.5000 mg | ORAL_SOLUTION | Freq: Four times a day (QID) | ORAL | Status: DC | PRN
  Filled 2012-04-18: qty 5

## 2012-04-18 MED ORDER — WHITE PETROLATUM GEL
Status: AC
  Administered 2012-04-18: 02:00:00
  Filled 2012-04-18: qty 5

## 2012-04-18 MED ORDER — SODIUM CHLORIDE 0.9 % IR SOLN
Status: DC | PRN
  Administered 2012-04-18: 6000 mL

## 2012-04-18 MED ORDER — MORPHINE SULFATE (PF) 1 MG/ML IV SOLN
INTRAVENOUS | Status: DC
  Administered 2012-04-18: 21:00:00 via INTRAVENOUS
  Administered 2012-04-18: 3 mg via INTRAVENOUS
  Administered 2012-04-18: 16:00:00 via INTRAVENOUS
  Administered 2012-04-19: 4.5 mg via INTRAVENOUS
  Administered 2012-04-19 (×2): 1.5 mg via INTRAVENOUS
  Administered 2012-04-19: 3 mg via INTRAVENOUS
  Administered 2012-04-19: 4.5 mg via INTRAVENOUS
  Administered 2012-04-20: 3 mg via INTRAVENOUS
  Administered 2012-04-20: 4 mg via INTRAVENOUS
  Administered 2012-04-20: 3 mg via INTRAVENOUS
  Administered 2012-04-20: 4.5 mg via INTRAVENOUS
  Administered 2012-04-20: 1.5 mg via INTRAVENOUS
  Administered 2012-04-20: 3 mg via INTRAVENOUS
  Administered 2012-04-21: 17:00:00 via INTRAVENOUS
  Administered 2012-04-21: 3 mg via INTRAVENOUS
  Filled 2012-04-18 (×4): qty 25

## 2012-04-18 MED ORDER — DROPERIDOL 2.5 MG/ML IJ SOLN
INTRAMUSCULAR | Status: DC | PRN
  Administered 2012-04-18: 0.625 mg via INTRAVENOUS

## 2012-04-18 MED ORDER — SODIUM CHLORIDE 0.9 % IV SOLN
INTRAVENOUS | Status: DC
  Administered 2012-04-18 (×2): via INTRAVENOUS
  Administered 2012-04-19: 100 mL/h via INTRAVENOUS
  Administered 2012-04-20: 17:00:00 via INTRAVENOUS
  Administered 2012-04-21: 1000 mL via INTRAVENOUS

## 2012-04-18 MED ORDER — PROPOFOL 10 MG/ML IV EMUL
INTRAVENOUS | Status: DC | PRN
  Administered 2012-04-18: 120 mg via INTRAVENOUS

## 2012-04-18 MED ORDER — MIDAZOLAM HCL 5 MG/5ML IJ SOLN
INTRAMUSCULAR | Status: DC | PRN
  Administered 2012-04-18: 2 mg via INTRAVENOUS

## 2012-04-18 MED ORDER — HYDROMORPHONE HCL PF 1 MG/ML IJ SOLN
0.2500 mg | INTRAMUSCULAR | Status: DC | PRN
  Administered 2012-04-18 (×4): 0.5 mg via INTRAVENOUS

## 2012-04-18 MED ORDER — KETOROLAC TROMETHAMINE 15 MG/ML IJ SOLN
15.0000 mg | Freq: Three times a day (TID) | INTRAMUSCULAR | Status: AC | PRN
  Administered 2012-04-22 (×2): 15 mg via INTRAVENOUS
  Filled 2012-04-18 (×2): qty 1

## 2012-04-18 MED ORDER — MORPHINE SULFATE (PF) 1 MG/ML IV SOLN: INTRAVENOUS | Status: DC

## 2012-04-18 MED ORDER — SUFENTANIL CITRATE 50 MCG/ML IV SOLN
INTRAVENOUS | Status: DC | PRN
  Administered 2012-04-18 (×2): 10 ug via INTRAVENOUS

## 2012-04-18 MED ORDER — HYDROMORPHONE HCL PF 1 MG/ML IJ SOLN
0.2500 mg | INTRAMUSCULAR | Status: DC | PRN
  Administered 2012-04-18 (×2): 0.5 mg via INTRAVENOUS

## 2012-04-18 MED ORDER — OXYCODONE-ACETAMINOPHEN 5-325 MG PO TABS: 1.0000 | ORAL_TABLET | ORAL | Status: DC | PRN

## 2012-04-18 MED ORDER — DEXAMETHASONE SODIUM PHOSPHATE 4 MG/ML IJ SOLN
INTRAMUSCULAR | Status: DC | PRN
  Administered 2012-04-18: 4 mg via INTRAVENOUS

## 2012-04-18 SURGICAL SUPPLY — 53 items
BANDAGE CONFORM 2  STR LF (GAUZE/BANDAGES/DRESSINGS) IMPLANT
BANDAGE ELASTIC 3 VELCRO ST LF (GAUZE/BANDAGES/DRESSINGS) ×2 IMPLANT
BANDAGE ELASTIC 4 VELCRO ST LF (GAUZE/BANDAGES/DRESSINGS) ×2 IMPLANT
BANDAGE GAUZE ELAST BULKY 4 IN (GAUZE/BANDAGES/DRESSINGS) ×2 IMPLANT
BNDG COHESIVE 1X5 TAN STRL LF (GAUZE/BANDAGES/DRESSINGS) IMPLANT
BNDG ESMARK 4X9 LF (GAUZE/BANDAGES/DRESSINGS) ×2 IMPLANT
CLOTH BEACON ORANGE TIMEOUT ST (SAFETY) ×2 IMPLANT
CORDS BIPOLAR (ELECTRODE) ×2 IMPLANT
COVER SURGICAL LIGHT HANDLE (MISCELLANEOUS) ×2 IMPLANT
CUFF TOURNIQUET SINGLE 18IN (TOURNIQUET CUFF) ×2 IMPLANT
CUFF TOURNIQUET SINGLE 24IN (TOURNIQUET CUFF) IMPLANT
DRAIN PENROSE 1/4X12 LTX STRL (WOUND CARE) ×2 IMPLANT
DRAPE SURG 17X23 STRL (DRAPES) ×2 IMPLANT
DRSG ADAPTIC 3X8 NADH LF (GAUZE/BANDAGES/DRESSINGS) ×2 IMPLANT
ELECT REM PT RETURN 9FT ADLT (ELECTROSURGICAL) ×2
ELECTRODE REM PT RTRN 9FT ADLT (ELECTROSURGICAL) ×1 IMPLANT
GAUZE XEROFORM 1X8 LF (GAUZE/BANDAGES/DRESSINGS) ×2 IMPLANT
GAUZE XEROFORM 5X9 LF (GAUZE/BANDAGES/DRESSINGS) IMPLANT
GLOVE BIOGEL PI IND STRL 8.5 (GLOVE) ×1 IMPLANT
GLOVE BIOGEL PI INDICATOR 8.5 (GLOVE) ×1
GLOVE SURG ORTHO 8.0 STRL STRW (GLOVE) ×2 IMPLANT
GOWN PREVENTION PLUS XLARGE (GOWN DISPOSABLE) ×2 IMPLANT
GOWN STRL NON-REIN LRG LVL3 (GOWN DISPOSABLE) ×6 IMPLANT
HANDPIECE INTERPULSE COAX TIP (DISPOSABLE) ×1
KIT BASIN OR (CUSTOM PROCEDURE TRAY) ×2 IMPLANT
KIT ROOM TURNOVER OR (KITS) ×2 IMPLANT
LOOP VESSEL MAXI BLUE (MISCELLANEOUS) ×2 IMPLANT
MANIFOLD NEPTUNE II (INSTRUMENTS) ×2 IMPLANT
NEEDLE HYPO 25GX1X1/2 BEV (NEEDLE) ×2 IMPLANT
NS IRRIG 1000ML POUR BTL (IV SOLUTION) ×2 IMPLANT
PACK ORTHO EXTREMITY (CUSTOM PROCEDURE TRAY) ×2 IMPLANT
PAD ARMBOARD 7.5X6 YLW CONV (MISCELLANEOUS) ×4 IMPLANT
PAD CAST 4YDX4 CTTN HI CHSV (CAST SUPPLIES) ×1 IMPLANT
PADDING CAST COTTON 4X4 STRL (CAST SUPPLIES) ×1
SET HNDPC FAN SPRY TIP SCT (DISPOSABLE) ×1 IMPLANT
SOAP 2 % CHG 4 OZ (WOUND CARE) ×2 IMPLANT
SPLINT PLASTER EXTRA FAST 3X15 (CAST SUPPLIES) ×1
SPLINT PLASTER GYPS XFAST 3X15 (CAST SUPPLIES) ×1 IMPLANT
SPONGE GAUZE 4X4 12PLY (GAUZE/BANDAGES/DRESSINGS) ×2 IMPLANT
SPONGE LAP 18X18 X RAY DECT (DISPOSABLE) ×2 IMPLANT
SPONGE LAP 4X18 X RAY DECT (DISPOSABLE) ×2 IMPLANT
SUCTION FRAZIER TIP 10 FR DISP (SUCTIONS) ×2 IMPLANT
SUT ETHILON 4 0 PS 2 18 (SUTURE) ×2 IMPLANT
SUT ETHILON 5 0 P 3 18 (SUTURE) ×1
SUT NYLON ETHILON 5-0 P-3 1X18 (SUTURE) ×1 IMPLANT
SYR CONTROL 10ML LL (SYRINGE) ×2 IMPLANT
TOWEL OR 17X24 6PK STRL BLUE (TOWEL DISPOSABLE) ×2 IMPLANT
TOWEL OR 17X26 10 PK STRL BLUE (TOWEL DISPOSABLE) ×2 IMPLANT
TUBE ANAEROBIC SPECIMEN COL (MISCELLANEOUS) IMPLANT
TUBE CONNECTING 12X1/4 (SUCTIONS) ×2 IMPLANT
UNDERPAD 30X30 INCONTINENT (UNDERPADS AND DIAPERS) ×2 IMPLANT
WATER STERILE IRR 1000ML POUR (IV SOLUTION) ×2 IMPLANT
YANKAUER SUCT BULB TIP NO VENT (SUCTIONS) ×2 IMPLANT

## 2012-04-18 NOTE — Brief Op Note (Addendum)
04/12/2012 - 04/18/2012  6:34 PM  PATIENT:  [name]  51 y.o. male  PRE-OPERATIVE DIAGNOSIS:  sepsis  POST-OPERATIVE DIAGNOSIS:  sepsis  PROCEDURE:  Procedure(s) (LRB): IRRIGATION AND DEBRIDEMENT EXTREMITY (Left)  SURGEON:  Surgeon(s) and Role:    * Sharma Covert, MD - Primary  PHYSICIAN ASSISTANT:   ASSISTANTS: none   ANESTHESIA:   general  EBL:  Total I/O In: 850 [I.V.:600; IV Piggyback:250] Out: 2150 [Urine:2150]  BLOOD ADMINISTERED:none  DRAINS: Penrose drain in the right deep forearm   LOCAL MEDICATIONS USED:  NONE  SPECIMEN:  No Specimen  DISPOSITION OF SPECIMEN:  N/A  COUNTS:  YES  TOURNIQUET:   Total Tourniquet Time Documented: Upper Arm (Left) - 28 minutes  DICTATION: .Other Dictation: Dictation Number [id number]  PLAN OF CARE: Admit to inpatient   PATIENT DISPOSITION:  PACU - hemodynamically stable.   Delay start of Pharmacological VTE agent (>24hrs) due to surgical blood loss or risk of bleeding: not applicable  Right ankle aspiration Under sterile technique right ankle was aspirated with no purulent fluid

## 2012-04-18 NOTE — Anesthesia Postprocedure Evaluation (Signed)
  Anesthesia Post-op Note  Patient: [name]  Procedure(s) Performed: Procedure(s) (LRB): RADICAL NECK DISSECTION (Right) DIRECT LARYNGOSCOPY (Bilateral) RIGID ESOPHAGOSCOPY (Bilateral)  Patient Location: PACU  Anesthesia Type: General  Level of Consciousness: awake, alert , oriented and patient cooperative  Airway and Oxygen Therapy: Patient Spontanous Breathing and Patient connected to nasal cannula oxygen  Post-op Pain: mild  Post-op Assessment: Post-op Vital signs reviewed, Patient's Cardiovascular Status Stable, Respiratory Function Stable, Patent Airway, No signs of Nausea or vomiting and Pain level controlled  Post-op Vital Signs: stable  Complications: No apparent anesthesia complications

## 2012-04-18 NOTE — Progress Notes (Signed)
Received lab results for gram stain of neck abscess, lab unable to reach OR/PACU.  No organisms seen. Ezra Sites RN, Neurosurgeon.

## 2012-04-18 NOTE — Progress Notes (Signed)
04/18/2012 12:13 PM  [name] [name] [id number]  Post-Op Day 1    Temp:  [98.2 F (36.8 C)-100.7 F (38.2 C)] 100.7 F (38.2 C) (04/25 0800) Pulse Rate:  [100-118] 118  (04/25 0920) Resp:  [8-24] 8  (04/25 0920) BP: (144-171)/(88-101) 156/96 mmHg (04/25 0920) SpO2:  [94 %-99 %] 96 % (04/25 0920),     Intake/Output Summary (Last 24 hours) at 04/18/12 1213 Last data filed at 04/18/12 0800  Gross per 24 hour  Intake   1180 ml  Output   3450 ml  Net  -2270 ml    Results for orders placed during the hospital encounter of 04/12/12 (from the past 24 hour(s))  PROTIME-INR     Status: Abnormal   Collection Time   04/17/12  4:50 PM      Component Value Range   Prothrombin Time 15.3 (*) 11.6 - 15.2 (seconds)   INR 1.18  0.00 - 1.49   TYPE AND SCREEN     Status: Normal   Collection Time   04/17/12  4:50 PM      Component Value Range   ABO/RH(D) O POS     Antibody Screen NEG     Sample Expiration 04/20/2012    SURGICAL PCR SCREEN     Status: Normal   Collection Time   04/17/12  9:32 PM      Component Value Range   MRSA, PCR NEGATIVE  NEGATIVE    Staphylococcus aureus NEGATIVE  NEGATIVE   GRAM STAIN     Status: Normal   Collection Time   04/17/12 11:48 PM      Component Value Range   Specimen Description ABSCESS NECK RIGHT     Special Requests PATIENT ON FOLLOWING UNASYN     Gram Stain       Value: NO WBC SEEN     NO ORGANISMS SEEN     CALLED [facility] Mendel Corning RN 04/18/12 0040 ACAINJ   Report Status 04/18/2012 FINAL    CBC     Status: Abnormal   Collection Time   04/18/12 10:12 AM      Component Value Range   WBC 15.0 (*) 4.0 - 10.5 (K/uL)   RBC 5.07  4.22 - 5.81 (MIL/uL)   Hemoglobin 11.3 (*) 13.0 - 17.0 (g/dL)   HCT 78.2 (*) 95.6 - 52.0 (%)   MCV 66.1 (*) 78.0 - 100.0 (fL)   MCH 22.3 (*) 26.0 - 34.0 (pg)   MCHC 33.7  30.0 - 36.0 (g/dL)   RDW 21.3  08.6 - 57.8 (%)   Platelets 221  150 - 400 (K/uL)  COMPREHENSIVE METABOLIC PANEL     Status: Abnormal   Collection Time   04/18/12 10:12 AM      Component Value Range   Sodium 132 (*) 135 - 145 (mEq/L)   Potassium 4.5  3.5 - 5.1 (mEq/L)   Chloride 98  96 - 112 (mEq/L)   CO2 19  19 - 32 (mEq/L)   Glucose, Bld 84  70 - 99 (mg/dL)   BUN 9  6 - 23 (mg/dL)   Creatinine, Ser 4.69  0.50 - 1.35 (mg/dL)   Calcium 8.1 (*) 8.4 - 10.5 (mg/dL)   Total Protein 6.7  6.0 - 8.3 (g/dL)   Albumin 1.4 (*) 3.5 - 5.2 (g/dL)   AST 77 (*) 0 - 37 (U/L)   ALT 33  0 - 53 (U/L)   Alkaline Phosphatase 149 (*) 39 - 117 (U/L)   Total Bilirubin 1.2  0.3 - 1.2 (mg/dL)   GFR calc non Af Amer >90  >90 (mL/min)   GFR calc Af Amer >90  >90 (mL/min)   Gram stain from neck wound:  No WBC's, no organisms seen.  SUBJECTIVE:  Moderate pain. Neck pain improved after surgery.  Thirsty.  No difficulty breathing or swallowing.   Now with abscess in LEFT arm/wrist going to OR for drainage later today.  Also on deck for percutaneous drainage LEFT thigh abscess.  Nurses note swelling and tenderness RIGHT thigh also.  OBJECTIVE:  Awake, alert.  Mod distress.  Mental status intact. Voice clear, breathing unlabored.  Large amount serosanguineous drainage.  Dressing changed without diff.  Still some skin erythema and tenderness over upper chest wall.  IMPRESSION:  Satisfactory check  PLAN:  Await cultures. May be difficult to assess overall progress until all sites of infection are controlled.  Etiology of disseminated infection remains unknown.  Cont. IV antibiosis.  Flo Shanks

## 2012-04-18 NOTE — Progress Notes (Signed)
*  PRELIMINARY RESULTS* Echocardiogram 2D Echocardiogram has been performed.  Glean Salen [facility] 04/18/2012, 3:40 PM

## 2012-04-18 NOTE — H&P (Signed)
[name] is an 51 y.o. male.   Chief Complaint: pt with painful joints 1 week ago; now has had development of multiple sites of abscess/infections. Rt neck and chest wall dissection; possible OR for left arm and rt leg Scheduled now for aspiration of left iliopsoas abscess aspiration vs drain placement HPI: multiple painful abscess sites  History reviewed. No pertinent past medical history.  Past Surgical History  Procedure Date  . Back surgery     No family history on file. Social History:  reports that he has never smoked. He has never used smokeless tobacco. He reports that he drinks about 1.5 ounces of alcohol per week. He reports that he does not use illicit drugs.  Allergies: No Known Allergies  Medications Prior to Admission  Medication Sig Dispense Refill  . acetaminophen (TYLENOL) 325 MG tablet Take 650 mg by mouth every 6 (six) hours as needed. pain      . Aspirin-Acetaminophen (GOODYS BODY PAIN PO) Take 1 Package by mouth daily as needed. For pain      . naproxen sodium (ANAPROX) 220 MG tablet Take 440 mg by mouth daily as needed. For pain      . ondansetron (ZOFRAN ODT) 4 MG disintegrating tablet Take 1 tablet (4 mg total) by mouth every 6 (six) hours as needed for nausea.  6 tablet  0  . oxyCODONE-acetaminophen (PERCOCET) 5-325 MG per tablet Take 1 tablet by mouth every 6 (six) hours as needed for pain.  30 tablet  0    Results for orders placed during the hospital encounter of 04/12/12 (from the past 48 hour(s))  HEPATIC FUNCTION PANEL     Status: Abnormal   Collection Time   04/16/12  2:51 PM      Component Value Range Comment   Total Protein 6.9  6.0 - 8.3 (g/dL)    Albumin 1.6 (*) 3.5 - 5.2 (g/dL)    AST 97 (*) 0 - 37 (U/L)    ALT 45  0 - 53 (U/L)    Alkaline Phosphatase 191 (*) 39 - 117 (U/L)    Total Bilirubin 0.6  0.3 - 1.2 (mg/dL)    Bilirubin, Direct 0.2  0.0 - 0.3 (mg/dL)    Indirect Bilirubin 0.4  0.3 - 0.9 (mg/dL)   CBC     Status: Abnormal   Collection Time   04/17/12 10:42 AM      Component Value Range Comment   WBC 13.9 (*) 4.0 - 10.5 (K/uL)    RBC 4.98  4.22 - 5.81 (MIL/uL)    Hemoglobin 11.2 (*) 13.0 - 17.0 (g/dL)    HCT 16.1 (*) 09.6 - 52.0 (%)    MCV 67.1 (*) 78.0 - 100.0 (fL)    MCH 22.5 (*) 26.0 - 34.0 (pg)    MCHC 33.5  30.0 - 36.0 (g/dL)    RDW 04.5  40.9 - 81.1 (%)    Platelets 309  150 - 400 (K/uL)   DIFFERENTIAL     Status: Abnormal   Collection Time   04/17/12 10:42 AM      Component Value Range Comment   Neutrophils Relative 74  43 - 77 (%)    Lymphocytes Relative 13  12 - 46 (%)    Monocytes Relative 12  3 - 12 (%)    Eosinophils Relative 1  0 - 5 (%)    Basophils Relative 0  0 - 1 (%)    Neutro Abs 10.3 (*) 1.7 - 7.7 (K/uL)  Lymphs Abs 1.8  0.7 - 4.0 (K/uL)    Monocytes Absolute 1.7 (*) 0.1 - 1.0 (K/uL)    Eosinophils Absolute 0.1  0.0 - 0.7 (K/uL)    Basophils Absolute 0.0  0.0 - 0.1 (K/uL)    RBC Morphology TARGET CELLS      WBC Morphology TOXIC GRANULATION   MILD LEFT SHIFT (1-5% METAS, OCC MYELO, OCC BANDS)   Smear Review        Value: PLATELET CLUMPS NOTED ON SMEAR, COUNT APPEARS ADEQUATE  PROTIME-INR     Status: Abnormal   Collection Time   04/17/12  4:50 PM      Component Value Range Comment   Prothrombin Time 15.3 (*) 11.6 - 15.2 (seconds)    INR 1.18  0.00 - 1.49    TYPE AND SCREEN     Status: Normal   Collection Time   04/17/12  4:50 PM      Component Value Range Comment   ABO/RH(D) O POS      Antibody Screen NEG      Sample Expiration 04/20/2012     SURGICAL PCR SCREEN     Status: Normal   Collection Time   04/17/12  9:32 PM      Component Value Range Comment   MRSA, PCR NEGATIVE  NEGATIVE     Staphylococcus aureus NEGATIVE  NEGATIVE    GRAM STAIN     Status: Normal   Collection Time   04/17/12 11:48 PM      Component Value Range Comment   Specimen Description ABSCESS NECK RIGHT      Special Requests PATIENT ON FOLLOWING UNASYN      Gram Stain        Value: NO WBC SEEN      NO ORGANISMS SEEN     CALLED HC Mendel Corning RN 04/18/12 0040 ACAINJ   Report Status 04/18/2012 FINAL     CBC     Status: Abnormal   Collection Time   04/18/12 10:12 AM      Component Value Range Comment   WBC 15.0 (*) 4.0 - 10.5 (K/uL) WHITE COUNT CONFIRMED ON SMEAR   RBC 5.07  4.22 - 5.81 (MIL/uL)    Hemoglobin 11.3 (*) 13.0 - 17.0 (g/dL)    HCT 40.9 (*) 81.1 - 52.0 (%)    MCV 66.1 (*) 78.0 - 100.0 (fL)    MCH 22.3 (*) 26.0 - 34.0 (pg)    MCHC 33.7  30.0 - 36.0 (g/dL)    RDW 91.4  78.2 - 95.6 (%)    Platelets 221  150 - 400 (K/uL)   COMPREHENSIVE METABOLIC PANEL     Status: Abnormal   Collection Time   04/18/12 10:12 AM      Component Value Range Comment   Sodium 132 (*) 135 - 145 (mEq/L)    Potassium 4.5  3.5 - 5.1 (mEq/L)    Chloride 98  96 - 112 (mEq/L)    CO2 19  19 - 32 (mEq/L)    Glucose, Bld 84  70 - 99 (mg/dL)    BUN 9  6 - 23 (mg/dL)    Creatinine, Ser 2.13  0.50 - 1.35 (mg/dL)    Calcium 8.1 (*) 8.4 - 10.5 (mg/dL)    Total Protein 6.7  6.0 - 8.3 (g/dL)    Albumin 1.4 (*) 3.5 - 5.2 (g/dL)    AST 77 (*) 0 - 37 (U/L)    ALT 33  0 - 53 (U/L)  Alkaline Phosphatase 149 (*) 39 - 117 (U/L)    Total Bilirubin 1.2  0.3 - 1.2 (mg/dL)    GFR calc non Af Amer >90  >90 (mL/min)    GFR calc Af Amer >90  >90 (mL/min)    Ct Abdomen Pelvis Wo Contrast  04/17/2012  *RADIOLOGY REPORT*  Clinical Data: Left groin pain  CT ABDOMEN AND PELVIS WITHOUT CONTRAST  Technique:  Multidetector CT imaging of the abdomen and pelvis was performed following the standard protocol without intravenous contrast.  Comparison: None.  Findings: Bilateral pleural effusions are noted with associated compressive atelectasis.  Small pericardial effusion is present.  Unenhanced CT was performed per clinician order.  Lack of IV contrast limits sensitivity and specificity, especially for evaluation of abdominal/pelvic solid viscera.  There is minimal retained contrast within nondilated renal collecting systems from  contrast enhanced exam performed separately today.  There is minimal nonspecific peri nephric stranding bilaterally, right greater than left.  Hyperdense area of the peripheral renal cortex of the right upper renal pole image 32 measures 6 mm.  Unenhanced liver, spleen, pancreas, and adrenal glands are normal.  No hydronephrosis.  The bowel is grossly normal.  Bladder is normal.  There is fusiform enlargement and inhomogeneity of the central hypodensity involving the left iliopsoas muscle.  The more focal well-defined area of hypodensity in the inferior portion of this muscle, overlying the region of the left femoral head, measures 2.8 x 2.0 cm.  A small amount of fluid is noted tracking along the interfascial space of the upper left quadriceps muscles.  Mild subcutaneous edema is present.  No lymphadenopathy.  Lumbar fusion hardware again noted.  IMPRESSION: Fusiform enlargement and hypodensity of the left iliopsoas muscle. Differential considerations include abscess, sterile fluid collection, or hematoma.  Findings discussed with Dr. Lazarus Salines by Dr. Chilton Si 04/17/12 825pm.  Small bilateral pleural effusions and associated compressive atelectasis.  Small pericardial effusion.  Original Report Authenticated By: Harrel Lemon, M.D.   Ct Soft Tissue Neck W Contrast  04/17/2012  *RADIOLOGY REPORT*  Clinical Data:  Pain and swelling right arm and chest.  Fever and positive blood cultures.  CT NECK AND CHEST WITH CONTRAST  Technique:  Multidetector CT imaging of the neck and chest was performed using the standard protocol after bolus administration of intravenous contrast.  Contrast: OMNIPAQUE IOHEXOL 300 MG/ML  SOLN  Comparison:   None.  CT NECK  Findings:  Large fluid collection in the right neck with peripheral enhancement  and gas bubbles compatible with abscess.  The largest component of the  abscess is  in the right lower neck at the level of thyroid and  measures 6.4 x 3.0 cm.  Multiple loculations of  abscess extending to the right sternoclavicular joint and retrosternal mediastinum.  The mediastinal abscess measures approximately 1.1 x 2.5 cm.  Abscess also extends deep to the pectoralis muscle on the right and extends superiorly along the posterior margin of the sternocleidomastoid muscle on the right. The sternocleidomastoid muscle is enlarged and edematous due to myositis.  No bony destruction is seen involving the sternal clavicular joint to suggest osteomyelitis however  this appears to be  septic arthritis.  Carotid artery and jugular vein are patent bilaterally.  Trachea is deviated to the left due to mass effect.  No compromise of the airway.  Small cervical lymph nodes are present without pathologic adenopathy.  The thyroid is displaced on the right but not involved.  IMPRESSION: Large mass lesion in the right  lower neck.  This extends into the sternal clavicular joint which appears to be involved with infection.  There is extension of abscess into  the superior mediastinum posterior to the manubrium.  There is also abscess extending deep to the pectoralis muscle.  CT CHEST  Findings: Small pleural effusions bilaterally, right greater than left.  Mild bibasilar atelectasis.  No definite pneumonia or lung abscess.  Negative for pericardial effusion.  Abscess extends posterior to the manubrium compatible with mediastinitis and abscess related to neck infection as described in the above report.  Imaging of the upper abdomen reveal distended gallbladder with fluid surrounding the gallbladder.  This may be due to cholecystitis.  Consider ultrasound is symptomatic.  IMPRESSION: Bilateral pleural effusions and bibasilar atelectasis.  Mediastinitis with abscess extending from the right neck into the superior mediastinum.  Pericholecystic fluid, suggestive of cholecystitis.  I discussed the findings by telephone with Algis Downs PA on 04/17/2012 at 1400 hours.  Original Report Authenticated By: Camelia Phenes,  M.D.   Ct Chest W Contrast  04/17/2012  *RADIOLOGY REPORT*  Clinical Data:  Pain and swelling right arm and chest.  Fever and positive blood cultures.  CT NECK AND CHEST WITH CONTRAST  Technique:  Multidetector CT imaging of the neck and chest was performed using the standard protocol after bolus administration of intravenous contrast.  Contrast: OMNIPAQUE IOHEXOL 300 MG/ML  SOLN  Comparison:   None.  CT NECK  Findings:  Large fluid collection in the right neck with peripheral enhancement  and gas bubbles compatible with abscess.  The largest component of the  abscess is  in the right lower neck at the level of thyroid and  measures 6.4 x 3.0 cm.  Multiple loculations of abscess extending to the right sternoclavicular joint and retrosternal mediastinum.  The mediastinal abscess measures approximately 1.1 x 2.5 cm.  Abscess also extends deep to the pectoralis muscle on the right and extends superiorly along the posterior margin of the sternocleidomastoid muscle on the right. The sternocleidomastoid muscle is enlarged and edematous due to myositis.  No bony destruction is seen involving the sternal clavicular joint to suggest osteomyelitis however  this appears to be  septic arthritis.  Carotid artery and jugular vein are patent bilaterally.  Trachea is deviated to the left due to mass effect.  No compromise of the airway.  Small cervical lymph nodes are present without pathologic adenopathy.  The thyroid is displaced on the right but not involved.  IMPRESSION: Large mass lesion in the right lower neck.  This extends into the sternal clavicular joint which appears to be involved with infection.  There is extension of abscess into  the superior mediastinum posterior to the manubrium.  There is also abscess extending deep to the pectoralis muscle.  CT CHEST  Findings: Small pleural effusions bilaterally, right greater than left.  Mild bibasilar atelectasis.  No definite pneumonia or lung abscess.  Negative for  pericardial effusion.  Abscess extends posterior to the manubrium compatible with mediastinitis and abscess related to neck infection as described in the above report.  Imaging of the upper abdomen reveal distended gallbladder with fluid surrounding the gallbladder.  This may be due to cholecystitis.  Consider ultrasound is symptomatic.  IMPRESSION: Bilateral pleural effusions and bibasilar atelectasis.  Mediastinitis with abscess extending from the right neck into the superior mediastinum.  Pericholecystic fluid, suggestive of cholecystitis.  I discussed the findings by telephone with Algis Downs PA on 04/17/2012 at 1400 hours.  Original Report  Authenticated By: Camelia Phenes, M.D.   Mr Hand Left W Wo Contrast  04/18/2012  *RADIOLOGY REPORT*  Clinical Data: Sepsis.  Cellulitis.  MRI OF THE LEFT HAND WITHOUT AND WITH CONTRAST  Technique:  Multiplanar, multisequence MR imaging was performed both before and after administration of intravenous contrast.  Contrast: 15mL MULTIHANCE GADOBENATE DIMEGLUMINE 529 MG/ML IV SOLN  Comparison: CT scan dated 04/13/2012 and radiographs dated 04/12/2012  Findings: There is a 4.9 x 4.0 x 0.8 cm abscess between the pronator quadratus muscle and the flexor digitorum muscles in the distal left forearm.  There is also fluid and abnormal enhancement in the distal radial ulnar joint consistent with a septic joint.  The patient has an effusion in the radiocarpal joint with enhancement of the synovium of that joint as well.  No other discrete abscesses in the forearm or hand.  No osteomyelitis.  The diffuse edema and enhancement of the subcutaneous tissues of the distal forearm and hand is consistent with cellulitis. There is also slight enhancement of the muscles just above the abscess consistent with myositis.  IMPRESSION: Focal abscess in the volar aspect of the distal forearm between the flexor digitorum muscles and the pronator quadratus.  Probable septic distal radial ulnar joint  and radiocarpal joint.  Diffuse cellulitis.  Original Report Authenticated By: Gwynn Burly, M.D.    Review of Systems  Constitutional: Positive for fever.  HENT: Positive for neck pain.   Cardiovascular: Positive for chest pain.  Gastrointestinal: Positive for abdominal pain. Negative for nausea and vomiting.  Musculoskeletal: Positive for back pain and joint pain.  Neurological: Positive for headaches.    Blood pressure 156/96, pulse 118, temperature 100.7 F (38.2 C), temperature source Oral, resp. rate 8, height 5\' 4"  (1.626 m), weight 134 lb 7.7 oz (61 kg), SpO2 96.00%. Physical Exam  Constitutional: He is oriented to person, place, and time. He appears well-developed and well-nourished.  Neck:       Wearing neck bandages post Rt neck abscess dissection  Cardiovascular: Normal rate, regular rhythm and normal heart sounds.   No murmur heard. Respiratory: Effort normal and breath sounds normal. He has no wheezes.  GI: Soft. Bowel sounds are normal. There is tenderness.  Musculoskeletal: He exhibits edema and tenderness.       Pt has rt neck; chest wall abscess incisions Left arm now swollen and may need OR Right leg may need OR Left groin tender and swollen- for asp vs drain  Neurological: He is alert and oriented to person, place, and time. Coordination abnormal.  Skin: There is erythema.  Psychiatric: He has a normal mood and affect. His behavior is normal. Judgment and thought content normal.     Assessment/Plan Multiple sites of painful abscesses. Rt neck and rt chest wall dissection in OR yesterday. Now considering Rt leg and left arm I/D Left iliopsoas abscess asp/drain placement scheduled today in IR Pt aware of procedure benefits and risks and agreeable to proceed. Consent signed and in chart  Zackarey Holleman A 04/18/2012, 11:39 AM

## 2012-04-18 NOTE — Transfer of Care (Signed)
Immediate Anesthesia Transfer of Care Note  Patient: [name]  Procedure(s) Performed: Procedure(s) (LRB): IRRIGATION AND DEBRIDEMENT EXTREMITY (Left)  Patient Location: PACU  Anesthesia Type: General  Level of Consciousness: oriented, patient cooperative and responds to stimulation  Airway & Oxygen Therapy: Patient Spontanous Breathing and Patient connected to nasal cannula oxygen  Post-op Assessment: Report given to PACU RN, Post -op Vital signs reviewed and stable and Patient moving all extremities  Post vital signs: Reviewed and stable  Complications: No apparent anesthesia complications

## 2012-04-18 NOTE — Progress Notes (Signed)
Utilization Review Completed.[employer] T4/25/2013   

## 2012-04-18 NOTE — Progress Notes (Addendum)
Subjective: C/o postoperative pain   Antibiotics:  Anti-infectives     Start     Dose/Rate Route Frequency Ordered Stop   04/17/12 1600   clindamycin (CLEOCIN) IVPB 900 mg        900 mg 100 mL/hr over 30 Minutes Intravenous Every 8 hours 04/17/12 1454     04/17/12 1600  Ampicillin-Sulbactam (UNASYN) 3 g in sodium chloride 0.9 % 100 mL IVPB       3 g 100 mL/hr over 60 Minutes Intravenous Every 6 hours 04/17/12 1455     04/14/12 1400   ceFAZolin (ANCEF) IVPB 1 g/50 mL premix  Status:  Discontinued        1 g 100 mL/hr over 30 Minutes Intravenous 3 times per day 04/14/12 1057 04/17/12 1426   04/13/12 1900   vancomycin (VANCOCIN) 500 mg in sodium chloride 0.9 % 100 mL IVPB  Status:  Discontinued        500 mg 100 mL/hr over 60 Minutes Intravenous Every 12 hours 04/13/12 1601 04/14/12 1058   04/13/12 0815   moxifloxacin (AVELOX) IVPB 400 mg        400 mg 250 mL/hr over 60 Minutes Intravenous  Once 04/13/12 0805 04/13/12 0953   04/13/12 0500   vancomycin (VANCOCIN) IVPB 1000 mg/200 mL premix  Status:  Discontinued        1,000 mg 200 mL/hr over 60 Minutes Intravenous Every 12 hours 04/13/12 0453 04/13/12 1414   04/13/12 0445   vancomycin (VANCOCIN) IVPB 1000 mg/200 mL premix        1,000 mg 200 mL/hr over 60 Minutes Intravenous  Once 04/13/12 0442 04/13/12 0607          Medications: Scheduled Meds:   . ampicillin-sulbactam (UNASYN) IV  3 g Intravenous Q6H  . clindamycin (CLEOCIN) IV  900 mg Intravenous Q8H  . gadobenate dimeglumine  15 mL Intravenous Once  . potassium chloride  40 mEq Oral Daily  . sodium chloride  3 mL Intravenous Q12H  . white petrolatum      . DISCONTD:  ceFAZolin (ANCEF) IV  1 g Intravenous Q8H   Continuous Infusions:   . sodium chloride 100 mL/hr at 04/18/12 0800  . DISCONTD: sodium chloride 50 mL/hr at 04/16/12 2156   PRN Meds:.fentaNYL, iohexol, morphine, ondansetron (ZOFRAN) IV, ondansetron, oxyCODONE-acetaminophen, phenol, DISCONTD:  acetaminophen, DISCONTD: HYDROmorphone, DISCONTD: morphine, DISCONTD: ondansetron (ZOFRAN) IV   Objective: Weight change:   Intake/Output Summary (Last 24 hours) at 04/18/12 1036 Last data filed at 04/18/12 0800  Gross per 24 hour  Intake   1180 ml  Output   2400 ml  Net  -1220 ml   Blood pressure 156/96, pulse 118, temperature 100.7 F (38.2 C), temperature source Oral, resp. rate 8, height 5\' 4"  (1.626 m), weight 134 lb 7.7 oz (61 kg), SpO2 96.00%. Temp:  [98.2 F (36.8 C)-100.7 F (38.2 C)] 100.7 F (38.2 C) (04/25 0800) Pulse Rate:  [100-118] 118  (04/25 0920) Resp:  [8-24] 8  (04/25 0920) BP: (144-171)/(88-101) 156/96 mmHg (04/25 0920) SpO2:  [94 %-99 %] 96 % (04/25 0920)  Physical Exam: General: Alert and awake, oriented x3, not in any acute distress. HEENT: anicteric sclera, pupils reactive to light and accommodation, EOMI CVS regular rate, normal r,  no murmur rubs or gallops Chest: clear to auscultation bilaterally, no wheezing, rales or rhonchi Abdomen: soft nontender, nondistended, normal bowel sounds, Extremities: no  clubbing or edema noted bilaterally Skin: no rashes Lymph: no new lymphadenopathy Neuro: nonfocal  Lab Results:  Basename 04/18/12 1012 04/17/12 1042  WBC PENDING 13.9*  HGB 11.3* 11.2*  HCT 33.5* 33.4*  PLT PENDING 309    BMET No results found for this basename: NA:2,K:2,CL:2,CO2:2,GLUCOSE:2,BUN:2,CREATININE:2,CALCIUM:2 in the last 72 hours  Micro Results: Recent Results (from the past 240 hour(s))  CULTURE, BLOOD (ROUTINE X 2)     Status: Normal (Preliminary result)   Collection Time   04/13/12  1:28 AM      Component Value Range Status Comment   Specimen Description BLOOD RIGHT HAND   Final    Special Requests BOTTLES DRAWN AEROBIC AND ANAEROBIC [facility] EACH   Final    Culture  Setup Time [id number]   Final    Culture     Final    Value: GROUP A STREP (S.PYOGENES) ISOLATED     Note: CRITICAL RESULT CALLED TO, READ BACK BY AND  VERIFIED WITH: RN D. CELANO ON 04/14/12 AT 1050 BY DTERRY     Note: Gram Stain Report Called to,Read Back By and Verified With: RN M. TROGDEN ON 04/13/12 AT 1930 BY DTERRY   Report Status PENDING   Incomplete   CULTURE, BLOOD (ROUTINE X 2)     Status: Normal (Preliminary result)   Collection Time   04/13/12  1:34 AM      Component Value Range Status Comment   Specimen Description BLOOD RIGHT ARM   Final    Special Requests BOTTLES DRAWN AEROBIC AND ANAEROBIC 10CC EACH   Final    Culture  Setup Time [id number]   Final    Culture     Final    Value: GROUP A STREP (S.PYOGENES) ISOLATED     Note: CRITICAL RESULT CALLED TO, READ BACK BY AND VERIFIED WITH: RN D. CELANO ON 04/14/12 AT 1050 BY DTERRY     Note: Gram Stain Report Called to,Read Back By and Verified With: RN M. TROGDEN ON 04/13/12 AT 1930 BY DTERRY   Report Status PENDING   Incomplete   SURGICAL PCR SCREEN     Status: Normal   Collection Time   04/17/12  9:32 PM      Component Value Range Status Comment   MRSA, PCR NEGATIVE  NEGATIVE  Final    Staphylococcus aureus NEGATIVE  NEGATIVE  Final   GRAM STAIN     Status: Normal   Collection Time   04/17/12 11:48 PM      Component Value Range Status Comment   Specimen Description ABSCESS NECK RIGHT   Final    Special Requests PATIENT ON FOLLOWING UNASYN   Final    Gram Stain     Final    Value: NO WBC SEEN     NO ORGANISMS SEEN     CALLED HC Mendel Corning RN 04/18/12 0040 ACAINJ   Report Status 04/18/2012 FINAL   Final     Studies/Results: Ct Abdomen Pelvis Wo Contrast  04/17/2012  *RADIOLOGY REPORT*  Clinical Data: Left groin pain  CT ABDOMEN AND PELVIS WITHOUT CONTRAST  Technique:  Multidetector CT imaging of the abdomen and pelvis was performed following the standard protocol without intravenous contrast.  Comparison: None.  Findings: Bilateral pleural effusions are noted with associated compressive atelectasis.  Small pericardial effusion is present.  Unenhanced CT was performed per  clinician order.  Lack of IV contrast limits sensitivity and specificity, especially for evaluation of abdominal/pelvic solid viscera.  There is minimal retained contrast within nondilated renal collecting systems from contrast enhanced exam performed separately today.  There is  minimal nonspecific peri nephric stranding bilaterally, right greater than left.  Hyperdense area of the peripheral renal cortex of the right upper renal pole image 32 measures 6 mm.  Unenhanced liver, spleen, pancreas, and adrenal glands are normal.  No hydronephrosis.  The bowel is grossly normal.  Bladder is normal.  There is fusiform enlargement and inhomogeneity of the central hypodensity involving the left iliopsoas muscle.  The more focal well-defined area of hypodensity in the inferior portion of this muscle, overlying the region of the left femoral head, measures 2.8 x 2.0 cm.  A small amount of fluid is noted tracking along the interfascial space of the upper left quadriceps muscles.  Mild subcutaneous edema is present.  No lymphadenopathy.  Lumbar fusion hardware again noted.  IMPRESSION: Fusiform enlargement and hypodensity of the left iliopsoas muscle. Differential considerations include abscess, sterile fluid collection, or hematoma.  Findings discussed with Dr. Lazarus Salines by Dr. Chilton Si 04/17/12 825pm.  Small bilateral pleural effusions and associated compressive atelectasis.  Small pericardial effusion.  Original Report Authenticated By: Harrel Lemon, M.D.   Ct Soft Tissue Neck W Contrast  04/17/2012  *RADIOLOGY REPORT*  Clinical Data:  Pain and swelling right arm and chest.  Fever and positive blood cultures.  CT NECK AND CHEST WITH CONTRAST  Technique:  Multidetector CT imaging of the neck and chest was performed using the standard protocol after bolus administration of intravenous contrast.  Contrast: OMNIPAQUE IOHEXOL 300 MG/ML  SOLN  Comparison:   None.  CT NECK  Findings:  Large fluid collection in the right  neck with peripheral enhancement  and gas bubbles compatible with abscess.  The largest component of the  abscess is  in the right lower neck at the level of thyroid and  measures 6.4 x 3.0 cm.  Multiple loculations of abscess extending to the right sternoclavicular joint and retrosternal mediastinum.  The mediastinal abscess measures approximately 1.1 x 2.5 cm.  Abscess also extends deep to the pectoralis muscle on the right and extends superiorly along the posterior margin of the sternocleidomastoid muscle on the right. The sternocleidomastoid muscle is enlarged and edematous due to myositis.  No bony destruction is seen involving the sternal clavicular joint to suggest osteomyelitis however  this appears to be  septic arthritis.  Carotid artery and jugular vein are patent bilaterally.  Trachea is deviated to the left due to mass effect.  No compromise of the airway.  Small cervical lymph nodes are present without pathologic adenopathy.  The thyroid is displaced on the right but not involved.  IMPRESSION: Large mass lesion in the right lower neck.  This extends into the sternal clavicular joint which appears to be involved with infection.  There is extension of abscess into  the superior mediastinum posterior to the manubrium.  There is also abscess extending deep to the pectoralis muscle.  CT CHEST  Findings: Small pleural effusions bilaterally, right greater than left.  Mild bibasilar atelectasis.  No definite pneumonia or lung abscess.  Negative for pericardial effusion.  Abscess extends posterior to the manubrium compatible with mediastinitis and abscess related to neck infection as described in the above report.  Imaging of the upper abdomen reveal distended gallbladder with fluid surrounding the gallbladder.  This may be due to cholecystitis.  Consider ultrasound is symptomatic.  IMPRESSION: Bilateral pleural effusions and bibasilar atelectasis.  Mediastinitis with abscess extending from the right neck into  the superior mediastinum.  Pericholecystic fluid, suggestive of cholecystitis.  I discussed the findings by  telephone with Algis Downs PA on 04/17/2012 at 1400 hours.  Original Report Authenticated By: Camelia Phenes, M.D.   Ct Chest W Contrast  04/17/2012  *RADIOLOGY REPORT*  Clinical Data:  Pain and swelling right arm and chest.  Fever and positive blood cultures.  CT NECK AND CHEST WITH CONTRAST  Technique:  Multidetector CT imaging of the neck and chest was performed using the standard protocol after bolus administration of intravenous contrast.  Contrast: OMNIPAQUE IOHEXOL 300 MG/ML  SOLN  Comparison:   None.  CT NECK  Findings:  Large fluid collection in the right neck with peripheral enhancement  and gas bubbles compatible with abscess.  The largest component of the  abscess is  in the right lower neck at the level of thyroid and  measures 6.4 x 3.0 cm.  Multiple loculations of abscess extending to the right sternoclavicular joint and retrosternal mediastinum.  The mediastinal abscess measures approximately 1.1 x 2.5 cm.  Abscess also extends deep to the pectoralis muscle on the right and extends superiorly along the posterior margin of the sternocleidomastoid muscle on the right. The sternocleidomastoid muscle is enlarged and edematous due to myositis.  No bony destruction is seen involving the sternal clavicular joint to suggest osteomyelitis however  this appears to be  septic arthritis.  Carotid artery and jugular vein are patent bilaterally.  Trachea is deviated to the left due to mass effect.  No compromise of the airway.  Small cervical lymph nodes are present without pathologic adenopathy.  The thyroid is displaced on the right but not involved.  IMPRESSION: Large mass lesion in the right lower neck.  This extends into the sternal clavicular joint which appears to be involved with infection.  There is extension of abscess into  the superior mediastinum posterior to the manubrium.  There is  also abscess extending deep to the pectoralis muscle.  CT CHEST  Findings: Small pleural effusions bilaterally, right greater than left.  Mild bibasilar atelectasis.  No definite pneumonia or lung abscess.  Negative for pericardial effusion.  Abscess extends posterior to the manubrium compatible with mediastinitis and abscess related to neck infection as described in the above report.  Imaging of the upper abdomen reveal distended gallbladder with fluid surrounding the gallbladder.  This may be due to cholecystitis.  Consider ultrasound is symptomatic.  IMPRESSION: Bilateral pleural effusions and bibasilar atelectasis.  Mediastinitis with abscess extending from the right neck into the superior mediastinum.  Pericholecystic fluid, suggestive of cholecystitis.  I discussed the findings by telephone with Algis Downs PA on 04/17/2012 at 1400 hours.  Original Report Authenticated By: Camelia Phenes, M.D.   Mr Hand Left W Wo Contrast  04/18/2012  *RADIOLOGY REPORT*  Clinical Data: Sepsis.  Cellulitis.  MRI OF THE LEFT HAND WITHOUT AND WITH CONTRAST  Technique:  Multiplanar, multisequence MR imaging was performed both before and after administration of intravenous contrast.  Contrast: 15mL MULTIHANCE GADOBENATE DIMEGLUMINE 529 MG/ML IV SOLN  Comparison: CT scan dated 04/13/2012 and radiographs dated 04/12/2012  Findings: There is a 4.9 x 4.0 x 0.8 cm abscess between the pronator quadratus muscle and the flexor digitorum muscles in the distal left forearm.  There is also fluid and abnormal enhancement in the distal radial ulnar joint consistent with a septic joint.  The patient has an effusion in the radiocarpal joint with enhancement of the synovium of that joint as well.  No other discrete abscesses in the forearm or hand.  No osteomyelitis.  The diffuse  edema and enhancement of the subcutaneous tissues of the distal forearm and hand is consistent with cellulitis. There is also slight enhancement of the muscles just  above the abscess consistent with myositis.  IMPRESSION: Focal abscess in the volar aspect of the distal forearm between the flexor digitorum muscles and the pronator quadratus.  Probable septic distal radial ulnar joint and radiocarpal joint.  Diffuse cellulitis.  Original Report Authenticated By: Gwynn Burly, M.D.      Assessment/Plan: [name] is a 51 y.o. male with:   1)SEVERE GROUP A STREPTOCOCCAL INFECTION WITH BACTEREMIA AND MX METASATIC FOCI OF INFECTION  A)NEck abscess, mediastinal abscess with septic [place] joint:  --he is sp surgery by Dr. Lazarus Salines and hopefully he was able to drain all of the fluid out from the mediastinum as well:  --worry about the [facility] joint --will need protracted therapy and repeat imaging along the way  B) Hand infection: Greatly appreciate Ortho hand taking to OR today and would send cultures  C) Thigh : appreciate orthopedics he will have drain placed by IR  --make sure cutlures sent --I would also consider MRI of the thigh   D) ankle: would consider talking to ortho about aspirating the jont and sending for cell count and differential and if high wbc would have ankle washed out as well  E) Bacteremia: --ensure repeat blood cutlures clearing  CONTINUE UNASYN and clindamycin for now (likely drop clinda in am) --  2) ? Cholecystitis: is this incidental finding or related to his systemic disease He is broadly covered for usual suspects in this area   3) screening: Check hiv, hep serologies. I asked again and NO HX of IVDU Check a1c   LOS: 6 days   Acey Lav 04/18/2012, 10:36 AM

## 2012-04-18 NOTE — Progress Notes (Signed)
Pt seen/examined Discussed with patient the plan for incision and drainage Chart reviewed Will plan for drainage of abscess and joint exploration

## 2012-04-18 NOTE — Progress Notes (Signed)
[facility]   Subjective: Patient having moderate response to pain control postoperatively. Mainly complains of thirst and sore throat. On exam was found to have painful right ankle as well as swelling in right thigh. Denies chest pain or shortness of breath  Objective: Vital signs in last 24 hours: Temp:  [98.2 F (36.8 C)-100.7 F (38.2 C)] 99.8 F (37.7 C) (04/25 1200) Pulse Rate:  [100-118] 118  (04/25 0920) Resp:  [8-24] 8  (04/25 0920) BP: (144-171)/(88-101) 156/98 mmHg (04/25 1200) SpO2:  [94 %-99 %] 96 % (04/25 0920) Weight change:  Last BM Date: 04/16/12  Intake/Output from previous day: 04/24 0701 - 04/25 0700 In: 1320 [P.O.:720; I.V.:600] Out: 2400 [Urine:2400] Intake/Output this shift: Total I/O In: 100 [I.V.:100] Out: 1750 [Urine:1750]  General appearance: alert, cooperative, appears stated age, mild distress and toxic Throat: erythematous with white spots Neck: Dressing intact anterior neck with moderate serosanguineous drainage on dressing, moderate edema right neck extending down to right anterior chest wall-no crepitus Resp: Mostly clear to auscultation except for diminished in the bases with bibasilar crackles, 2 L nasal cannula, nitrite or or drooling Cardio: regular rate and rhythm, S1, S2 normal, no murmur, click, rub or gallop GI: soft, non-tender; bowel sounds normal; no masses,  no organomegaly Extremities: Right hand and forearm with proximal edema and cellulitic changes-very tender to touch, right thigh somewhat swollen and tender to touch, right ankle and foot very edematous and warm to touch with extensive pain reproduced with patient attempt to flex foot Neurologic: Grossly normal  Lab Results:  Basename 04/18/12 1012 04/17/12 1042  WBC 15.0* 13.9*  HGB 11.3* 11.2*  HCT 33.5* 33.4*  PLT 221 309   BMET  Basename 04/18/12 1012  NA 132*  K 4.5  CL 98  CO2 19  GLUCOSE 84  BUN 9  CREATININE 0.64  CALCIUM 8.1*     Studies/Results: Ct Abdomen Pelvis Wo Contrast  04/17/2012  *RADIOLOGY REPORT*  Clinical Data: Left groin pain  CT ABDOMEN AND PELVIS WITHOUT CONTRAST  Technique:  Multidetector CT imaging of the abdomen and pelvis was performed following the standard protocol without intravenous contrast.  Comparison: None.  Findings: Bilateral pleural effusions are noted with associated compressive atelectasis.  Small pericardial effusion is present.  Unenhanced CT was performed per clinician order.  Lack of IV contrast limits sensitivity and specificity, especially for evaluation of abdominal/pelvic solid viscera.  There is minimal retained contrast within nondilated renal collecting systems from contrast enhanced exam performed separately today.  There is minimal nonspecific peri nephric stranding bilaterally, right greater than left.  Hyperdense area of the peripheral renal cortex of the right upper renal pole image 32 measures 6 mm.  Unenhanced liver, spleen, pancreas, and adrenal glands are normal.  No hydronephrosis.  The bowel is grossly normal.  Bladder is normal.  There is fusiform enlargement and inhomogeneity of the central hypodensity involving the left iliopsoas muscle.  The more focal well-defined area of hypodensity in the inferior portion of this muscle, overlying the region of the left femoral head, measures 2.8 x 2.0 cm.  A small amount of fluid is noted tracking along the interfascial space of the upper left quadriceps muscles.  Mild subcutaneous edema is present.  No lymphadenopathy.  Lumbar fusion hardware again noted.  IMPRESSION: Fusiform enlargement and hypodensity of the left iliopsoas muscle. Differential considerations include abscess, sterile fluid collection, or hematoma.  Findings discussed with Dr. Lazarus Salines by Dr. Chilton Si 04/17/12 825pm.  Small bilateral pleural effusions and associated  compressive atelectasis.  Small pericardial effusion.  Original Report Authenticated By: Harrel Lemon,  M.D.   Ct Soft Tissue Neck W Contrast  04/17/2012  *RADIOLOGY REPORT*  Clinical Data:  Pain and swelling right arm and chest.  Fever and positive blood cultures.  CT NECK AND CHEST WITH CONTRAST  Technique:  Multidetector CT imaging of the neck and chest was performed using the standard protocol after bolus administration of intravenous contrast.  Contrast: OMNIPAQUE IOHEXOL 300 MG/ML  SOLN  Comparison:   None.  CT NECK  Findings:  Large fluid collection in the right neck with peripheral enhancement  and gas bubbles compatible with abscess.  The largest component of the  abscess is  in the right lower neck at the level of thyroid and  measures 6.4 x 3.0 cm.  Multiple loculations of abscess extending to the right sternoclavicular joint and retrosternal mediastinum.  The mediastinal abscess measures approximately 1.1 x 2.5 cm.  Abscess also extends deep to the pectoralis muscle on the right and extends superiorly along the posterior margin of the sternocleidomastoid muscle on the right. The sternocleidomastoid muscle is enlarged and edematous due to myositis.  No bony destruction is seen involving the sternal clavicular joint to suggest osteomyelitis however  this appears to be  septic arthritis.  Carotid artery and jugular vein are patent bilaterally.  Trachea is deviated to the left due to mass effect.  No compromise of the airway.  Small cervical lymph nodes are present without pathologic adenopathy.  The thyroid is displaced on the right but not involved.  IMPRESSION: Large mass lesion in the right lower neck.  This extends into the sternal clavicular joint which appears to be involved with infection.  There is extension of abscess into  the superior mediastinum posterior to the manubrium.  There is also abscess extending deep to the pectoralis muscle.  CT CHEST  Findings: Small pleural effusions bilaterally, right greater than left.  Mild bibasilar atelectasis.  No definite pneumonia or lung abscess.   Negative for pericardial effusion.  Abscess extends posterior to the manubrium compatible with mediastinitis and abscess related to neck infection as described in the above report.  Imaging of the upper abdomen reveal distended gallbladder with fluid surrounding the gallbladder.  This may be due to cholecystitis.  Consider ultrasound is symptomatic.  IMPRESSION: Bilateral pleural effusions and bibasilar atelectasis.  Mediastinitis with abscess extending from the right neck into the superior mediastinum.  Pericholecystic fluid, suggestive of cholecystitis.  I discussed the findings by telephone with Algis Downs PA on 04/17/2012 at 1400 hours.  Original Report Authenticated By: Camelia Phenes, M.D.   Ct Chest W Contrast  04/17/2012  *RADIOLOGY REPORT*  Clinical Data:  Pain and swelling right arm and chest.  Fever and positive blood cultures.  CT NECK AND CHEST WITH CONTRAST  Technique:  Multidetector CT imaging of the neck and chest was performed using the standard protocol after bolus administration of intravenous contrast.  Contrast: OMNIPAQUE IOHEXOL 300 MG/ML  SOLN  Comparison:   None.  CT NECK  Findings:  Large fluid collection in the right neck with peripheral enhancement  and gas bubbles compatible with abscess.  The largest component of the  abscess is  in the right lower neck at the level of thyroid and  measures 6.4 x 3.0 cm.  Multiple loculations of abscess extending to the right sternoclavicular joint and retrosternal mediastinum.  The mediastinal abscess measures approximately 1.1 x 2.5 cm.  Abscess also  extends deep to the pectoralis muscle on the right and extends superiorly along the posterior margin of the sternocleidomastoid muscle on the right. The sternocleidomastoid muscle is enlarged and edematous due to myositis.  No bony destruction is seen involving the sternal clavicular joint to suggest osteomyelitis however  this appears to be  septic arthritis.  Carotid artery and jugular vein  are patent bilaterally.  Trachea is deviated to the left due to mass effect.  No compromise of the airway.  Small cervical lymph nodes are present without pathologic adenopathy.  The thyroid is displaced on the right but not involved.  IMPRESSION: Large mass lesion in the right lower neck.  This extends into the sternal clavicular joint which appears to be involved with infection.  There is extension of abscess into  the superior mediastinum posterior to the manubrium.  There is also abscess extending deep to the pectoralis muscle.  CT CHEST  Findings: Small pleural effusions bilaterally, right greater than left.  Mild bibasilar atelectasis.  No definite pneumonia or lung abscess.  Negative for pericardial effusion.  Abscess extends posterior to the manubrium compatible with mediastinitis and abscess related to neck infection as described in the above report.  Imaging of the upper abdomen reveal distended gallbladder with fluid surrounding the gallbladder.  This may be due to cholecystitis.  Consider ultrasound is symptomatic.  IMPRESSION: Bilateral pleural effusions and bibasilar atelectasis.  Mediastinitis with abscess extending from the right neck into the superior mediastinum.  Pericholecystic fluid, suggestive of cholecystitis.  I discussed the findings by telephone with Algis Downs PA on 04/17/2012 at 1400 hours.  Original Report Authenticated By: Camelia Phenes, M.D.   Mr Hand Left W Wo Contrast  04/18/2012  *RADIOLOGY REPORT*  Clinical Data: Sepsis.  Cellulitis.  MRI OF THE LEFT HAND WITHOUT AND WITH CONTRAST  Technique:  Multiplanar, multisequence MR imaging was performed both before and after administration of intravenous contrast.  Contrast: 15mL MULTIHANCE GADOBENATE DIMEGLUMINE 529 MG/ML IV SOLN  Comparison: CT scan dated 04/13/2012 and radiographs dated 04/12/2012  Findings: There is a 4.9 x 4.0 x 0.8 cm abscess between the pronator quadratus muscle and the flexor digitorum muscles in the distal  left forearm.  There is also fluid and abnormal enhancement in the distal radial ulnar joint consistent with a septic joint.  The patient has an effusion in the radiocarpal joint with enhancement of the synovium of that joint as well.  No other discrete abscesses in the forearm or hand.  No osteomyelitis.  The diffuse edema and enhancement of the subcutaneous tissues of the distal forearm and hand is consistent with cellulitis. There is also slight enhancement of the muscles just above the abscess consistent with myositis.  IMPRESSION: Focal abscess in the volar aspect of the distal forearm between the flexor digitorum muscles and the pronator quadratus.  Probable septic distal radial ulnar joint and radiocarpal joint.  Diffuse cellulitis.  Original Report Authenticated By: Gwynn Burly, M.D.    Medications: I have reviewed the patient's current medications.  Assessment/Plan:  Active Problems:  Bacteremia due to Group A Streptococcus *Has disseminated streptococcal infection *Patient gives history of upper respiratory infection on April 13 for which he sought medical care but did not receive antibiotics and it is presumed he was treated for a viral upper respiratory infection. He endorses he did not get better from that in fact and subsequently developed more constitutional symptoms and later presented to our hospital with current condition. *Appreciate infectious disease assistance *Agree  with empiric coverage with clindamycin and Unasyn until culture sensitivities returned *Check 2-D echocardiogram-patient high risk for endocarditis   Neck abscess with extension into right chest wall and mediastinum *Appreciate ENT assistance *Postoperatively seems stable *Add Chloraseptic for sore throat    Sepsis *Currently hemodynamically stable *Continue treatment of underlying causes which appears to be disseminated streptococcal infection group A   Acute respiratory failure with hypoxia/ Pleural  effusion, bilateral with compressive atelectasis *Continue supportive care with oxygen and pulmonary toileting *At risk for developing HCAP- current antibiotics provide broad-spectrum empiric coverage  Cellulitis and abscess of Left hand/Left Psoas abscess/ Right ankle and thigh swelling with pain and erythema- ? Evolving abscess *All appear to be evident of seeding of group A streptococcal type bacteremia *Discuss with Dr. Shelle Iron and recommendation was to see if interventional radiology to aspirate right ankle during iliopsoas aspiration. Dr. Deanne Coffer with radiology is reluctant to proceed with blind aspiration without imaging first. We'll defer to orthopedic team tomorrow as to whether imaging is required before they would proceed with aspiration of right ankle effusion. *IR plans percutaneous drainage and her aspiration of psoas abscess later today   Transaminitis- likely due to sepsis *Improving with treatment and rehydration continue to follow   Anemia- ? Hemodilution *Hemoglobin has drifted down by 2 g after admission and rehydration-continue to follow   Hypokalemia *Resolved  Hyponatremia *Continue to follow-not severe  Disposition *Remaining stepdown    LOS: 6 days   Junious Silk, ANP pager [phone]  [facility] [email or web address] Password: [email or web address]  04/18/2012, 1:32 PM  I have examined the patient and reviewed the chart. I have modified the above note and agree with it.   Calvert Cantor, MD [phone]

## 2012-04-18 NOTE — Progress Notes (Signed)
Patient started on PCA morphine; VVS; will continue to monitor.

## 2012-04-18 NOTE — Progress Notes (Signed)
Patient ID: [name], male   DOB: 1961/06/21, 51 y.o.   MRN: [id number]   [name] Silk, NP Has called to ask [place] to ADD ON a Rt ankle effusion aspiration To the already scheduled Left iliopsoas abscess aspiration/ drain while pt is in Radiology Today.  I told her we would have Interventional Radiologist review case and will move forward If we can.  She is placing order for Rt ankle effusion aspiration in computer Also gaining consent for same

## 2012-04-18 NOTE — Transfer of Care (Signed)
Immediate Anesthesia Transfer of Care Note  Patient: [name]  Procedure(s) Performed: Procedure(s) (LRB): RADICAL NECK DISSECTION (Right) DIRECT LARYNGOSCOPY (Bilateral) RIGID ESOPHAGOSCOPY (Bilateral)  Patient Location: PACU  Anesthesia Type: General  Level of Consciousness: sedated, patient cooperative and responds to stimulation  Airway & Oxygen Therapy: Patient Spontanous Breathing and Patient connected to nasal cannula oxygen  Post-op Assessment: Report given to PACU RN, Post -op Vital signs reviewed and stable and Patient moving all extremities  Post vital signs: Reviewed and stable  Complications: No apparent anesthesia complications

## 2012-04-18 NOTE — Progress Notes (Signed)
Subjective: Sepsis, neck pain. Denies groin pain.   Objective: Vital signs in last 24 hours: Temp:  [98.2 F (36.8 C)-99.8 F (37.7 C)] 98.2 F (36.8 C) (04/25 0300) Pulse Rate:  [100-112] 100  (04/25 0300) Resp:  [17-24] 17  (04/25 0300) BP: (144-156)/(88-101) 156/100 mmHg (04/25 0300) SpO2:  [94 %-99 %] 96 % (04/25 0300)  Intake/Output from previous day: 04/24 0701 - 04/25 0700 In: 1220 [P.O.:720; I.V.:500] Out: 2400 [Urine:2400] Intake/Output this shift:     Basename 04/17/12 1042 04/16/12 0547  HGB 11.2* 10.6*    Basename 04/17/12 1042 04/16/12 0547  WBC 13.9* 15.5*  RBC 4.98 4.73  HCT 33.4* 31.0*  PLT 309 283   No results found for this basename: NA:2,K:2,CL:2,CO2:2,BUN:2,CREATININE:2,GLUCOSE:2,CALCIUM:2 in the last 72 hours  Basename 04/17/12 1650  LABPT --  INR 1.18    ABD soft Sensation intact distally Intact pulses distally tender left groin. No lymphadenopathy. compts soft. No thigh swelling. No pain with active or passive ROM left hip  Assessment/Plan: Hematoma vs early abscess left iliopsoas. Plan aspiration US guided with placement of drain. Antibiotics. Discussed with radiology.   Pa Tennant C 04/18/2012, 7:25 AM

## 2012-04-18 NOTE — Progress Notes (Signed)
Pt's MRI reviewed Pt has focal abscess collection volar forearm and concern for septic radiocarpal/distal radioulnar joint Will plan for formal incision and drainage today in OR Continue NPO Will discuss findings with patient and family

## 2012-04-18 NOTE — Anesthesia Preprocedure Evaluation (Signed)
Anesthesia Evaluation  Patient identified by MRN, date of birth, ID band Patient awake    Reviewed: Allergy & Precautions, H&P , NPO status , Patient's Chart, lab work & pertinent test results  History of Anesthesia Complications Negative for: history of anesthetic complications  Airway Mallampati: II TM Distance: >3 FB Neck ROM: Full    Dental No notable dental hx. (+) Teeth Intact and Dental Advisory Given   Pulmonary neg pulmonary ROS,  breath sounds clear to auscultation  Pulmonary exam normal       Cardiovascular negative cardio ROS  Rhythm:Regular Rate:Normal     Neuro/Psych negative neurological ROS  negative psych ROS   GI/Hepatic negative GI ROS, Neg liver ROS,   Endo/Other  negative endocrine ROS  Renal/GU negative Renal ROS     Musculoskeletal   Abdominal   Peds  Hematology   Anesthesia Other Findings   Reproductive/Obstetrics                           Anesthesia Physical Anesthesia Plan  ASA: III  Anesthesia Plan: General   Post-op Pain Management:    Induction: Intravenous  Airway Management Planned: LMA  Additional Equipment:   Intra-op Plan:   Post-operative Plan:   Informed Consent: I have reviewed the patients History and Physical, chart, labs and discussed the procedure including the risks, benefits and alternatives for the proposed anesthesia with the patient or authorized representative who has indicated his/her understanding and acceptance.   Dental advisory given  Plan Discussed with: Surgeon and CRNA  Anesthesia Plan Comments: (Plan routine monitors, GA- LMA OK)        Anesthesia Quick Evaluation

## 2012-04-18 NOTE — Op Note (Signed)
04/18/2012  12:29 AM    Stanly, [name]  [id number]   Pre-Op Dx:  Deep RIGHT neck infection with extension onto RIGHT chest wall and upper mediastinum  Post-op Dx: same  Proc: DL, esophagoscopy, Exploration/I&D RIGHT lower neck, mediastinum, chest wall   Surg:  Flo Shanks T MD  Anes:  GOT  EBL:  100 ml  Comp:   none  Findings:  Multiple loculations of frank pus in deep RIGHT neck, lateral and deep to SCM.  No obvious frank pus beneath pec major muscle.  Nl endoscopy.  Cultures sent for aerobic and anaerobic organisms.    Procedure: With the patient in a comfortable supine position, GOT anesthesia was induced without difficulty.  At an appropriate level, the table was turned 90 degrees away from Anesthesia.  A clean preparation and draping was performed in the standard fashion.  A rubber tooth guard was placed.   The anterior commissure laryngoscope was introduced taking care to protect lips, teeth, and endotracheal tube.  Complete laryngoscopy was performed in the standard fashion.  The findings were as described above.  The laryngoscope was removed.  The cervical esophagoscope was lubricated and inserted into the hypopharynx.  With gentle pressure, it was passed through the cricopharyngeus and advanced to its full length without difficulty with the findings as described above.  It was removed.  The oropharynx, oral cavity, nasopharynx and hypopharynx were palpated with findings as described above.  The neck was palpated on both sides with the findings as described above.  A rubber tooth guard was removed and the endoscopy was completed.  The table was turned 90 back towards anesthesia and the patient was extended over a shoulder roll with the head supported and rotated to the left for access to the right neck. The neck was palpated with the findings as described above. A sterile preparation and draping of the right neck was accomplished in the standard fashion.  A low  transverse wrinkle was identified and an 8 cm incision was sharply executed and carried through skin, subcutaneous fat, and platysma muscle. Subplatysmal planes were raised superiorly and inferiorly. Working anterior to the sternomastoid muscle, the fascia was dissected forward and down into his the strap muscles. Working inferiorly, frank pus was encountered and cultured for aerobes and anaerobes .  sternocleidomastoid muscle was sharply Removed from the medial clavicle using the cautery.  Blunt dissection developed loculations down behind the upper aspect of the manubrium of the sternum. Additional dissection was carried up along the vertebral bodies deep to the pharynx. Additional loculations were discovered low neck lateral to the sternocleidomastoid muscle. Dissection was carried down bluntly and sharply over the clavicle and the pectoralis major was dissected away from the  clavicle for a short distance. This area was bluntly probed with a finger tip and also with a Kelly clamp. No obvious loculation of pus was encountered.  At this point, the wound was thoroughly explored and no further loculations or tracts were discovered. The wound is thoroughly irrigated with warm sterile saline. 1/2 inch Penrose drains were placed up along the pharynx, into the upper mediastinum, into the anterior chest wall deep to the pectoralis major muscle, and posterior lateral to the sternomastoid muscle. These were secured to the skin edge with 4-0 nylon sutures. Hemostasis was observed. The wound was minimally closed with vertical mattress sutures of 4-0 nylon. The patient was cleaned up. A fluff and four-inch Kerlix dressing was applied in the standard fashion.  Hemostasis was again observed. Patient  was returned to anesthesia, awakened, extubated, and transferred to recovery in stable condition.   Dispo:   PACU to step down unit.  Plan:  IV antibiosis. Await culture results. We'll begin advancing drains when the drainage  is reduced.  He will need additional attention for the apparently disseminated sepsis including his left hand, and an apparent left iliopsoas muscle abscess. I talked with Dr. Jillyn Hidden, orthopedist this evening. We will continue with Unasyn and clindamycin for the time being. Infectious disease will continue to assist.  Cephus Richer MD

## 2012-04-18 NOTE — Procedures (Signed)
US aspiration Left Ilioipsoas collection Scant bloody flud returned, sent for GS, C&S No complication No blood loss. See complete dictation in [facility].

## 2012-04-18 NOTE — Anesthesia Postprocedure Evaluation (Signed)
Anesthesia Post Note  Patient: [name]  Procedure(s) Performed: Procedure(s) (LRB): IRRIGATION AND DEBRIDEMENT EXTREMITY (Left)  Anesthesia type: general  Patient location: PACU  Post pain: Pain level controlled  Post assessment: Patient's Cardiovascular Status Stable  Last Vitals:  Filed Vitals:   04/18/12 1855  BP:   Pulse: 120  Temp:   Resp: 25    Post vital signs: Reviewed and stable  Level of consciousness: sedated  Complications: No apparent anesthesia complications

## 2012-04-19 ENCOUNTER — Encounter (HOSPITAL_COMMUNITY): Payer: Self-pay | Admitting: Orthopedic Surgery

## 2012-04-19 DIAGNOSIS — A419 Sepsis, unspecified organism: Secondary | ICD-10-CM

## 2012-04-19 LAB — HEMOGLOBIN A1C: Hgb A1c MFr Bld: 6.2 % — ABNORMAL HIGH (ref <5.7)

## 2012-04-19 MED ORDER — NYSTATIN 100000 UNIT/ML MT SUSP
10.0000 mL | Freq: Four times a day (QID) | OROMUCOSAL | Status: DC
  Administered 2012-04-19 – 2012-05-07 (×71): 1000000 [IU] via ORAL
  Filled 2012-04-19 (×81): qty 10

## 2012-04-19 NOTE — Progress Notes (Signed)
Utilization Review Completed.Misha Vanoverbeke T4/26/2013   

## 2012-04-19 NOTE — Progress Notes (Signed)
[facility]   Subjective: Looks much better today. Patient endorses marked improvement in pain. Denies any shortness of breath or other respiratory symptoms.  Objective: Vital signs in last 24 hours: Temp:  [97.6 F (36.4 C)-99.8 F (37.7 C)] 98 F (36.7 C) (04/26 0700) Pulse Rate:  [99-123] 100  (04/26 0400) Resp:  [10-29] 12  (04/26 0827) BP: (126-160)/(85-101) 126/85 mmHg (04/26 0700) SpO2:  [91 %-98 %] 94 % (04/26 0827) FiO2 (%):  [2 %] 2 % (04/25 2014) Weight change:  Last BM Date: 04/16/12  Intake/Output from previous day: 04/25 0701 - 04/26 0700 In: 950 [I.V.:700; IV Piggyback:250] Out: 4190 [Urine:4190] Intake/Output this shift: Total I/O In: 640 [P.O.:640] Out: 600 [Urine:600]  General appearance: alert, cooperative, appears stated age, mild distress and toxic Throat: erythematous with white spots, tongue is coated and looks consistent with oral Candida Neck: Dressing intact anterior neck clean dry and intact, moderate edema right neck extending down to right anterior chest wall-no crepitus Resp: Mostly clear to auscultation except for diminished in the bases with bibasilar crackles, room air with saturations 94-97% Cardio: regular rate and rhythm, S1, S2 normal, no murmur, click, rub or gallop GI: soft, non-tender; bowel sounds normal; no masses,  no organomegaly Extremities: Left hand and forearm with proximal edema and cellulitic changes-very tender to touch, right thigh somewhat swollen and tender to touch, right ankle and foot very edematous and warm to touch with less pain today Neurologic: Grossly normal  Lab Results:  Basename 04/18/12 1012 04/17/12 1042  WBC 15.0* 13.9*  HGB 11.3* 11.2*  HCT 33.5* 33.4*  PLT 221 309   BMET  Basename 04/18/12 1012  NA 132*  K 4.5  CL 98  CO2 19  GLUCOSE 84  BUN 9  CREATININE 0.64  CALCIUM 8.1*    Studies/Results: Ct Abdomen Pelvis Wo Contrast  04/17/2012  *RADIOLOGY REPORT*  Clinical Data: Left  groin pain  CT ABDOMEN AND PELVIS WITHOUT CONTRAST  Technique:  Multidetector CT imaging of the abdomen and pelvis was performed following the standard protocol without intravenous contrast.  Comparison: None.  Findings: Bilateral pleural effusions are noted with associated compressive atelectasis.  Small pericardial effusion is present.  Unenhanced CT was performed per clinician order.  Lack of IV contrast limits sensitivity and specificity, especially for evaluation of abdominal/pelvic solid viscera.  There is minimal retained contrast within nondilated renal collecting systems from contrast enhanced exam performed separately today.  There is minimal nonspecific peri nephric stranding bilaterally, right greater than left.  Hyperdense area of the peripheral renal cortex of the right upper renal pole image 32 measures 6 mm.  Unenhanced liver, spleen, pancreas, and adrenal glands are normal.  No hydronephrosis.  The bowel is grossly normal.  Bladder is normal.  There is fusiform enlargement and inhomogeneity of the central hypodensity involving the left iliopsoas muscle.  The more focal well-defined area of hypodensity in the inferior portion of this muscle, overlying the region of the left femoral head, measures 2.8 x 2.0 cm.  A small amount of fluid is noted tracking along the interfascial space of the upper left quadriceps muscles.  Mild subcutaneous edema is present.  No lymphadenopathy.  Lumbar fusion hardware again noted.  IMPRESSION: Fusiform enlargement and hypodensity of the left iliopsoas muscle. Differential considerations include abscess, sterile fluid collection, or hematoma.  Findings discussed with Dr. Lazarus Salines by Dr. Chilton Si 04/17/12 825pm.  Small bilateral pleural effusions and associated compressive atelectasis.  Small pericardial effusion.  Original Report Authenticated By:  Harrel Lemon, M.D.   Ct Soft Tissue Neck W Contrast  04/17/2012  *RADIOLOGY REPORT*  Clinical Data:  Pain and swelling  right arm and chest.  Fever and positive blood cultures.  CT NECK AND CHEST WITH CONTRAST  Technique:  Multidetector CT imaging of the neck and chest was performed using the standard protocol after bolus administration of intravenous contrast.  Contrast: OMNIPAQUE IOHEXOL 300 MG/ML  SOLN  Comparison:   None.  CT NECK  Findings:  Large fluid collection in the right neck with peripheral enhancement  and gas bubbles compatible with abscess.  The largest component of the  abscess is  in the right lower neck at the level of thyroid and  measures 6.4 x 3.0 cm.  Multiple loculations of abscess extending to the right sternoclavicular joint and retrosternal mediastinum.  The mediastinal abscess measures approximately 1.1 x 2.5 cm.  Abscess also extends deep to the pectoralis muscle on the right and extends superiorly along the posterior margin of the sternocleidomastoid muscle on the right. The sternocleidomastoid muscle is enlarged and edematous due to myositis.  No bony destruction is seen involving the sternal clavicular joint to suggest osteomyelitis however  this appears to be  septic arthritis.  Carotid artery and jugular vein are patent bilaterally.  Trachea is deviated to the left due to mass effect.  No compromise of the airway.  Small cervical lymph nodes are present without pathologic adenopathy.  The thyroid is displaced on the right but not involved.  IMPRESSION: Large mass lesion in the right lower neck.  This extends into the sternal clavicular joint which appears to be involved with infection.  There is extension of abscess into  the superior mediastinum posterior to the manubrium.  There is also abscess extending deep to the pectoralis muscle.  CT CHEST  Findings: Small pleural effusions bilaterally, right greater than left.  Mild bibasilar atelectasis.  No definite pneumonia or lung abscess.  Negative for pericardial effusion.  Abscess extends posterior to the manubrium compatible with mediastinitis  and abscess related to neck infection as described in the above report.  Imaging of the upper abdomen reveal distended gallbladder with fluid surrounding the gallbladder.  This may be due to cholecystitis.  Consider ultrasound is symptomatic.  IMPRESSION: Bilateral pleural effusions and bibasilar atelectasis.  Mediastinitis with abscess extending from the right neck into the superior mediastinum.  Pericholecystic fluid, suggestive of cholecystitis.  I discussed the findings by telephone with Algis Downs PA on 04/17/2012 at 1400 hours.  Original Report Authenticated By: Camelia Phenes, M.D.   Ct Chest W Contrast  04/17/2012  *RADIOLOGY REPORT*  Clinical Data:  Pain and swelling right arm and chest.  Fever and positive blood cultures.  CT NECK AND CHEST WITH CONTRAST  Technique:  Multidetector CT imaging of the neck and chest was performed using the standard protocol after bolus administration of intravenous contrast.  Contrast: OMNIPAQUE IOHEXOL 300 MG/ML  SOLN  Comparison:   None.  CT NECK  Findings:  Large fluid collection in the right neck with peripheral enhancement  and gas bubbles compatible with abscess.  The largest component of the  abscess is  in the right lower neck at the level of thyroid and  measures 6.4 x 3.0 cm.  Multiple loculations of abscess extending to the right sternoclavicular joint and retrosternal mediastinum.  The mediastinal abscess measures approximately 1.1 x 2.5 cm.  Abscess also extends deep to the pectoralis muscle on the right and extends  superiorly along the posterior margin of the sternocleidomastoid muscle on the right. The sternocleidomastoid muscle is enlarged and edematous due to myositis.  No bony destruction is seen involving the sternal clavicular joint to suggest osteomyelitis however  this appears to be  septic arthritis.  Carotid artery and jugular vein are patent bilaterally.  Trachea is deviated to the left due to mass effect.  No compromise of the airway.   Small cervical lymph nodes are present without pathologic adenopathy.  The thyroid is displaced on the right but not involved.  IMPRESSION: Large mass lesion in the right lower neck.  This extends into the sternal clavicular joint which appears to be involved with infection.  There is extension of abscess into  the superior mediastinum posterior to the manubrium.  There is also abscess extending deep to the pectoralis muscle.  CT CHEST  Findings: Small pleural effusions bilaterally, right greater than left.  Mild bibasilar atelectasis.  No definite pneumonia or lung abscess.  Negative for pericardial effusion.  Abscess extends posterior to the manubrium compatible with mediastinitis and abscess related to neck infection as described in the above report.  Imaging of the upper abdomen reveal distended gallbladder with fluid surrounding the gallbladder.  This may be due to cholecystitis.  Consider ultrasound is symptomatic.  IMPRESSION: Bilateral pleural effusions and bibasilar atelectasis.  Mediastinitis with abscess extending from the right neck into the superior mediastinum.  Pericholecystic fluid, suggestive of cholecystitis.  I discussed the findings by telephone with Algis Downs PA on 04/17/2012 at 1400 hours.  Original Report Authenticated By: Camelia Phenes, M.D.   [place] Abscess Drain  04/19/2012  *RADIOLOGY REPORT*  Clinical Data: Multiple infectious sites.  Left iliopsoas abscess suggested on CT.  ULTRASOUND GUIDED ASPIRATION  Comparison: CT from the previous day  Technique and findings:  Survey ultrasound of the left iliopsoas and hip region was performed and a   complex intramuscular collection was identified corresponding to the hypodensity seen on CT.  An appropriate skin entry site was determined.  Site was marked, prepped with Betadine, draped in usual sterile fashion, infiltrated locally with 1% lidocaine. Under real time ultrasound guidance, an 18 gauge spinal needle was advanced into various facets  of   the collection.  Only scant amount of bloody fluid could be aspirated.  Sample sent for Gram stain, culture and sensitivity. The patient tolerated the procedure well. No immediate complication.  IMPRESSION:  Aspiration of left iliopsoas process, returning only scant amount of bloody fluid, sent for Gram stain and culture.  Original Report Authenticated By: Osa Craver, M.D.   Mr Hand Left W Wo Contrast  04/18/2012  *RADIOLOGY REPORT*  Clinical Data: Sepsis.  Cellulitis.  MRI OF THE LEFT HAND WITHOUT AND WITH CONTRAST  Technique:  Multiplanar, multisequence MR imaging was performed both before and after administration of intravenous contrast.  Contrast: 15mL MULTIHANCE GADOBENATE DIMEGLUMINE 529 MG/ML IV SOLN  Comparison: CT scan dated 04/13/2012 and radiographs dated 04/12/2012  Findings: There is a 4.9 x 4.0 x 0.8 cm abscess between the pronator quadratus muscle and the flexor digitorum muscles in the distal left forearm.  There is also fluid and abnormal enhancement in the distal radial ulnar joint consistent with a septic joint.  The patient has an effusion in the radiocarpal joint with enhancement of the synovium of that joint as well.  No other discrete abscesses in the forearm or hand.  No osteomyelitis.  The diffuse edema and enhancement of the subcutaneous tissues of  the distal forearm and hand is consistent with cellulitis. There is also slight enhancement of the muscles just above the abscess consistent with myositis.  IMPRESSION: Focal abscess in the volar aspect of the distal forearm between the flexor digitorum muscles and the pronator quadratus.  Probable septic distal radial ulnar joint and radiocarpal joint.  Diffuse cellulitis.  Original Report Authenticated By: Gwynn Burly, M.D.    Medications: I have reviewed the patient's current medications.  Assessment/Plan:  Active Problems:  Bacteremia due to Group A Streptococcus *Has disseminated streptococcal  infection *Patient gives history of upper respiratory infection on April 13 for which he sought medical care but did not receive antibiotics and it is presumed he was treated for a viral upper respiratory infection. He endorses he did not get better from that in fact and subsequently developed more constitutional symptoms and later presented to our hospital with current condition. *Appreciate infectious disease assistance *Agree with empiric coverage with clindamycin and Unasyn until culture sensitivities returned * 2-D echocardiogram demonstrates a small to moderate pericardial effusion circumferential to the heart as well as a left pleural effusion. At this time patient does not have cardiac tamponade physiology. No vegetation is seen. Dr. Dietrich Pates with [facility] cardiology reviewed the echocardiogram. In her opinion the effusion is actually small and again no tamponade physiology is seen. At this time there are no clinical indications to proceed with pericardiocentesis. Also no clinical findings that would be suggestive of septic or infectious pericardial effusion. No vegetation is seen. If the TEE is indicated she asked that we please call her back for formal consultation.   Neck abscess with extension into right chest wall and mediastinum *Appreciate ENT assistance *Postoperatively seems stable *Add Chloraseptic for sore throat    Sepsis *Currently hemodynamically stable *Continue treatment of underlying causes which appears to be disseminated streptococcal infection group A   Acute respiratory failure with hypoxia/ Pleural effusion, bilateral with compressive atelectasis *Continue supportive care with oxygen and pulmonary toileting *At risk for developing HCAP- current antibiotics provide broad-spectrum empiric coverage  Cellulitis and abscess of Left hand/Left Psoas abscess/ Right ankle and thigh swelling with pain and erythema- ? Evolving abscess *All appear to be evident of seeding of  group A streptococcal type bacteremia *Have paged Dr. Shelle Iron to make sure he is aware that aspiration has not yet been done on patient's right ankle effusion. Dr Shelle Iron has not called back and therefore spoke with Dr Victorino Dike in regards to this.  *Status post aspiration of iliopsoas abscess and interventional radiology 04/18/2012.  Oral Candida Begin nystatin swish and swallow   Transaminitis- likely due to sepsis *Improving with treatment and rehydration continue to follow   Anemia- ? Hemodilution *Hemoglobin has drifted down by 2 g after admission and rehydration and currently remains stable.   Hypokalemia *Resolved  Hyponatremia *Continue to follow-not severe  Disposition *Remaining stepdown    LOS: 7 days   Junious Silk, ANP pager [phone]   I have examined the patient and reviewed the chart. I have modified the above not and agree with it.   Calvert Cantor, MD [phone] [facility] [email or web address] Password: [email or web address]  04/19/2012, 11:47 AM

## 2012-04-19 NOTE — Progress Notes (Addendum)
Subjective: 1 Day Post-Op Procedure(s) (LRB): IRRIGATION AND DEBRIDEMENT EXTREMITY (Left) Multiple areas of concern Patient reports pain as mild.  He states he is much improved today.  He denies any left thigh/hip pain today.  He complains of cont swelling in right foot and ankle but he states this is much better and has minimal pain.    Objective: Vital signs in last 24 hours: Temp:  [97.6 F (36.4 C)-99.7 F (37.6 C)] 98.2 F (36.8 C) (04/26 1200) Pulse Rate:  [99-123] 100  (04/26 0400) Resp:  [10-29] 12  (04/26 1258) BP: (126-160)/(85-101) 128/87 mmHg (04/26 1200) SpO2:  [91 %-98 %] 96 % (04/26 1258) FiO2 (%):  [2 %] 2 % (04/25 2014)  Intake/Output from previous day: 04/25 0701 - 04/26 0700 In: 950 [I.V.:700; IV Piggyback:250] Out: 4190 [Urine:4190] Intake/Output this shift: Total I/O In: 640 [P.O.:640] Out: 600 [Urine:600]   Basename 04/18/12 1012 04/17/12 1042  HGB 11.3* 11.2*    Basename 04/18/12 1012 04/17/12 1042  WBC 15.0* 13.9*  RBC 5.07 4.98  HCT 33.5* 33.4*  PLT 221 309    Basename 04/18/12 1012  NA 132*  K 4.5  CL 98  CO2 19  BUN 9  CREATININE 0.64  GLUCOSE 84  CALCIUM 8.1*    Basename 04/17/12 1650  LABPT --  INR 1.18    Left groin- noted site of aspiration no erythema   Area is non tender to light touch or deep palpation  No pain with ROM of left hip Right ankle/foot  Moderate edema no erythema  No pain with dorsi/plantar flexion  Good sensation  Mild tenderness over dorsal aspect  Assessment/Plan: 1 Day Post-Op Procedure(s) (LRB): IRRIGATION AND DEBRIDEMENT EXTREMITY (Left) per Dr Melvyn Novas Cont observation in regard to left groin (iliopsoas) and right LE Patient has history of gout may be cause of swelling in right foot but will cont to monitor If recurrent symptoms ( pain/ swelling) may consider further imaging studies  Domonic Hiscox R. 04/19/2012, 2:56 PM

## 2012-04-19 NOTE — Progress Notes (Signed)
Pt seen examined States wrist feels better i did aspirate ankle in or last night and did not get any fluid from ankle that was concerning for infection No pus aspirated I did not send scant fluid I removed Will examine left wrist on 04/21/2012, and remove drains and determine whether patient will need repeat I/D. Continue to follow  Iv abx.

## 2012-04-19 NOTE — Progress Notes (Signed)
Subjective: POD#2 from incision and drainage of right neck and mediastinum/chest abscesses, low-grade fevers but subjectively feeling better  Objective: Vital signs in last 24 hours: Temp:  [97.6 F (36.4 C)-100.7 F (38.2 C)] 98.1 F (36.7 C) (04/26 0447) Pulse Rate:  [99-123] 100  (04/26 0400) Resp:  [8-29] 16  (04/25 2039) BP: (132-171)/(92-101) 132/97 mmHg (04/26 0400) SpO2:  [91 %-98 %] 93 % (04/26 0400) FiO2 (%):  [2 %] 2 % (04/25 2014)  Right chest still edematous, right neck soft and supple, minimal drainage from penrose drains, awake and alert  @LABLAST2 (wbc:2,hgb:2,hct:2,plt:2)  Basename 04/18/12 1012  NA 132*  K 4.5  CL 98  CO2 19  GLUCOSE 84  BUN 9  CREATININE 0.64  CALCIUM 8.1*    Medications: Current facility-administered medications:0.9 %  sodium chloride infusion, , Intravenous, Continuous, Flo Shanks, MD, Last Rate: 100 mL/hr at 04/19/12 0600;  Ampicillin-Sulbactam (UNASYN) 3 g in sodium chloride 0.9 % 100 mL IVPB, 3 g, Intravenous, Q6H, Randall Hiss, MD, 3 g at 04/19/12 0439;  clindamycin (CLEOCIN) IVPB 900 mg, 900 mg, Intravenous, Q8H, Randall Hiss, MD, 900 mg at 04/18/12 2353 diphenhydrAMINE (BENADRYL) 12.5 MG/5ML elixir 12.5 mg, 12.5 mg, Oral, Q6H PRN, Russella Dar, NP;  diphenhydrAMINE (BENADRYL) injection 12.5 mg, 12.5 mg, Intravenous, Q6H PRN, Russella Dar, NP;  ketorolac (TORADOL) 15 MG/ML injection 15 mg, 15 mg, Intravenous, Q8H PRN, Russella Dar, NP;  morphine 1 MG/ML PCA injection, , Intravenous, Q4H, Russella Dar, NP, 4.5 mg at 04/19/12 0320 naloxone [facility]) injection 0.4 mg, 0.4 mg, Intravenous, PRN, Russella Dar, NP;  ondansetron [facility]) injection 4 mg, 4 mg, Intravenous, Q6H PRN, Russella Dar, NP;  ondansetron (ZOFRAN-ODT) disintegrating tablet 4 mg, 4 mg, Oral, Q6H PRN, Rhetta Mura, MD;  phenol (CHLORASEPTIC) mouth spray 1 spray, 1 spray, Mouth/Throat, PRN, Russella Dar, NP potassium chloride SA  (K-DUR,KLOR-CON) CR tablet 40 mEq, 40 mEq, Oral, Daily, Rhetta Mura, MD, 40 mEq at 04/17/12 1021;  sodium chloride 0.9 % injection 3 mL, 3 mL, Intravenous, Q12H, Rhetta Mura, MD, 3 mL at 04/18/12 0919;  sodium chloride 0.9 % injection 9 mL, 9 mL, Intravenous, PRN, Russella Dar, NP;  DISCONTD: 0.9 % irrigation (POUR BTL), , , PRN, Sharma Covert, MD, 1,000 mL at 04/18/12 1753 DISCONTD: fentaNYL (SUBLIMAZE) injection 50 mcg, 50 mcg, Intravenous, Q2H PRN, Hanley Seamen, MD, 50 mcg at 04/18/12 1411;  DISCONTD: HYDROmorphone (DILAUDID) injection 0.25-0.5 mg, 0.25-0.5 mg, Intravenous, Q5 min PRN, Germaine Pomfret, MD, 0.5 mg at 04/18/12 1942;  DISCONTD: morphine 1 MG/ML PCA injection, , Intravenous, Q4H, Russella Dar, NP DISCONTD: morphine 2 MG/ML injection 2 mg, 2 mg, Intravenous, Q3H PRN, Stephani Police, PA, 2 mg at 04/18/12 1244;  DISCONTD: ondansetron (ZOFRAN) injection 4 mg, 4 mg, Intravenous, Q6H PRN, Carlisle Beers Molpus, MD, 4 mg at 04/13/12 0506;  DISCONTD: oxyCODONE-acetaminophen (PERCOCET) 5-325 MG per tablet 1-2 tablet, 1-2 tablet, Oral, Q4H PRN, Flo Shanks, MD DISCONTD: sodium chloride irrigation 0.9 %, , , PRN, Sharma Covert, MD, 6,000 mL at 04/18/12 1753 Facility-Administered Medications Ordered in Other Encounters: DISCONTD: dexamethasone (DECADRON) injection, , , PRN, Melina Schools, CRNA, 4 mg at 04/18/12 1752;  DISCONTD: droperidol (INAPSINE) injection, , , PRN, Melina Schools, CRNA, 0.625 mg at 04/18/12 1752;  DISCONTD: midazolam (VERSED) 5 MG/5ML injection, , , PRN, Melina Schools, CRNA, 2 mg at 04/18/12 1722 DISCONTD: ondansetron (ZOFRAN) injection, , , PRN, Marita [place]  Linton Rump, CRNA, 4 mg at 04/18/12 1752;  DISCONTD: propofol (DIPRIVAN) 10 MG/ML infusion, , , PRN, Melina Schools, CRNA, 120 mg at 04/18/12 1732;  DISCONTD: SUFentanil (SUFENTA) injection, , , PRN, Melina Schools, CRNA, 10 mcg at 04/18/12 1754  Assessment/Plan: S/p I&D right neck abscess, POD#2, will continue  to monitor drain output, will leave drains for now, dressing changes PRN. Will follow up culture, sensitivities and adjust antibiotics as needed   LOS: 7 days   Melvenia Beam 04/19/2012, 6:49 AM

## 2012-04-19 NOTE — Evaluation (Signed)
Occupational Therapy Evaluation Patient Details Name: [name] MRN: [id number] DOB: [birth year] Today's Date: 04/19/2012 Time: 4098-1191 OT Time Calculation (min): 39 min  OT Assessment / Plan / Recommendation Clinical Impression  This 51 yo male admitted with sepsis (neck, ilieo psoas, left arm, and righ foot) presents to acute OT with problems below thus affecting pt's PLOF at I with BADLS/IADLs. Will benefit from acute OT with follow up HHOT v. OP OT depending on progress     OT Assessment  Patient needs continued OT Services    Follow Up Recommendations  Home health OT;Outpatient OT (depends on progress)    Equipment Recommendations   (TBD)    Frequency Min 3X/week    Precautions / Restrictions Precautions Precautions: None Restrictions Weight Bearing Restrictions: No       ADL  Eating/Feeding: Simulated;Set up Where Assessed - Eating/Feeding: Bed level Grooming: Simulated;Set up Where Assessed - Grooming: Supine, head of bed up Upper Body Bathing: Simulated;Minimal assistance Where Assessed - Upper Body Bathing: Supine, head of bed up Lower Body Bathing: Simulated;Maximal assistance Where Assessed - Lower Body Bathing: Supine, head of bed up Upper Body Dressing: Simulated;+1 Total assistance Where Assessed - Upper Body Dressing: Supine, head of bed up Lower Body Dressing: Simulated;+1 Total assistance Where Assessed - Lower Body Dressing: Supine, head of bed up Toilet Transfer: Not assessed Toilet Transfer Method: Not assessed Toileting - Clothing Manipulation: Not assessed Toileting - Hygiene: Not assessed Where Assessed - Toileting Hygiene: Not assessed Tub/Shower Transfer: Not assessed Tub/Shower Transfer Method: Not assessed Ambulation Related to ADLs: Nursing did not feel it was good to get pt up due to ilieo psoas I&D as well as now possible sepsis R foot    OT Goals Acute Rehab OT Goals OT Goal Formulation: With patient Time For Goal Achievement:  05/03/12 Potential to Achieve Goals: Good ADL Goals Pt Will Perform Eating: with modified independence;Sitting, chair;Sitting, edge of bed;Unsupported ADL Goal: Eating - Progress: Goal set today Pt Will Perform Grooming: with modified independence;Unsupported;Standing at sink (2 tasks) ADL Goal: Grooming - Progress: Goal set today Pt Will Perform Upper Body Bathing: with modified independence;Unsupported (sit to stand at sink) ADL Goal: Upper Body Bathing - Progress: Goal set today Pt Will Perform Lower Body Bathing: with modified independence;Unsupported (sit to stand at sink) ADL Goal: Lower Body Bathing - Progress: Goal set today Pt Will Perform Upper Body Dressing: with modified independence;Supported;Sitting, chair;Sitting, bed ADL Goal: Upper Body Dressing - Progress: Goal set today Pt Will Perform Lower Body Dressing: with modified independence;Sit to stand from chair;Sit to stand from bed;Unsupported ADL Goal: Lower Body Dressing - Progress: Goal set today Pt Will Transfer to Toilet: with modified independence;Ambulation;with DME;3-in-1 ADL Goal: Toilet Transfer - Progress: Goal set today Pt Will Perform Toileting - Clothing Manipulation: with modified independence;Standing ADL Goal: Toileting - Clothing Manipulation - Progress: Goal set today Pt Will Perform Toileting - Hygiene: Independently;Sit to stand from 3-in-1/toilet ADL Goal: Toileting - Hygiene - Progress: Goal set today Arm Goals Pt Will Tolerate PROM: to maintain range of motion;without pain;to decrease contracture;Left upper extremity;5 reps (shoulder, elbow, forearm, digits) Arm Goal: PROM - Progress: Goal set today Additional Arm Goal #1: Pt will be Independent with LUE SROM shoulder, elbow, forearm, and digits; as well as keeping LUE elevated appropriately Arm Goal: Additional Goal #1 - Progress: Goal set today  Visit Information  Last OT Received On: 04/19/12 Assistance Needed: +1 (In bed activities)      Subjective Data  Subjective: I  about died Friday night I was so bad Patient Stated Goal: Be able to get up and move around again   Prior Functioning  Home Living Lives With: Spouse;Son Available Help at Discharge: Family (in evenings only) Type of Home: House Home Access: Stairs to enter [employer] of Steps: 2 Entrance Stairs-Rails: Right (going up) Home Layout: Multi-level (once upstairs can stay there) Alternate Level Stairs-Number of Steps: multiple stairs, mutltiple levels Alternate Level Stairs-Rails: Right (going up) Bathroom Shower/Tub: Tub/shower unit;Curtain (garden tub) Firefighter: Pharmacist, community: Yes How Accessible: Accessible via walker Home Adaptive Equipment: None Prior Function Level of Independence: Independent Able to Take Stairs?: Reciprically Driving: Yes Vocation: Unemployed Communication Communication: No difficulties Dominant Hand: Right    Cognition  Overall Cognitive Status: Appears within functional limits for tasks assessed/performed Arousal/Alertness: Awake/alert Orientation Level: Appears intact for tasks assessed Behavior During Session: [facility] for tasks performed    Extremity/Trunk Assessment Right Upper Extremity Assessment RUE ROM/Strength/Tone: Within functional levels Left Upper Extremity Assessment LUE ROM/Strength/Tone: Deficits;Due to pain LUE ROM/Strength/Tone Deficits: I&D 04/19/2102 of distal LUE. AROM digits 1/5, PROM digits pt can do by himself getting about 10 degrees for composite flexion and then back out to natural resting position for extension; PROM forearm supination about 10 degrees from full pronation then back to full pronation; AAROM elbow flexion about 10 degrees from touching chin from supine in bed position; AAROM elbow extension -20 degrees; shoulder flexion about 80 degrees then starts to c/o neck pain (cannot get thumb to neutral  for this). LUE Sensation:  (I can feel my fingers they  just feel "numb")   Mobility Bed Mobility Bed Mobility: Not assessed Transfers Transfers: Not assessed   Exercise Other Exercises Other Exercises: Digit flex/ext SROM and elbow flex/ext PROM/SROM 10 reps every waking hour; as well as keeping arm propped so that elbow is significantly lower than hand  Balance    End of Session OT - End of Session Activity Tolerance: Patient limited by pain Patient left: in bed;with call bell/phone within reach Nurse Communication:  (see misc. exercises section)    Evette Georges [phone] 04/19/2012, 12:06 PM

## 2012-04-19 NOTE — Progress Notes (Signed)
Subjective: Pt feels pain in leg is much improved, (left() still with pain in right ankle and in neck but improved   Antibiotics:  Anti-infectives     Start     Dose/Rate Route Frequency Ordered Stop   04/17/12 1600   clindamycin (CLEOCIN) IVPB 900 mg        900 mg 100 mL/hr over 30 Minutes Intravenous Every 8 hours 04/17/12 1454     04/17/12 1600  Ampicillin-Sulbactam (UNASYN) 3 g in sodium chloride 0.9 % 100 mL IVPB       3 g 100 mL/hr over 60 Minutes Intravenous Every 6 hours 04/17/12 1455     04/14/12 1400   ceFAZolin (ANCEF) IVPB 1 g/50 mL premix  Status:  Discontinued        1 g 100 mL/hr over 30 Minutes Intravenous 3 times per day 04/14/12 1057 04/17/12 1426   04/13/12 1900   vancomycin (VANCOCIN) 500 mg in sodium chloride 0.9 % 100 mL IVPB  Status:  Discontinued        500 mg 100 mL/hr over 60 Minutes Intravenous Every 12 hours 04/13/12 1601 04/14/12 1058   04/13/12 0815   moxifloxacin (AVELOX) IVPB 400 mg        400 mg 250 mL/hr over 60 Minutes Intravenous  Once 04/13/12 0805 04/13/12 0953   04/13/12 0500   vancomycin (VANCOCIN) IVPB 1000 mg/200 mL premix  Status:  Discontinued        1,000 mg 200 mL/hr over 60 Minutes Intravenous Every 12 hours 04/13/12 0453 04/13/12 1414   04/13/12 0445   vancomycin (VANCOCIN) IVPB 1000 mg/200 mL premix        1,000 mg 200 mL/hr over 60 Minutes Intravenous  Once 04/13/12 0442 04/13/12 0607          Medications: Scheduled Meds:    . ampicillin-sulbactam (UNASYN) IV  3 g Intravenous Q6H  . clindamycin (CLEOCIN) IV  900 mg Intravenous Q8H  . morphine   Intravenous Q4H  . nystatin  10 mL Oral QID  . potassium chloride  40 mEq Oral Daily  . sodium chloride  3 mL Intravenous Q12H   Continuous Infusions:    . sodium chloride 100 mL/hr (04/19/12 1300)   PRN Meds:.diphenhydrAMINE, diphenhydrAMINE, ketorolac, naloxone, ondansetron (ZOFRAN) IV, ondansetron, phenol, sodium chloride, DISCONTD: 0.9 % irrigation (POUR BTL),  DISCONTD: HYDROmorphone, DISCONTD: sodium chloride irrigation   Objective: Weight change:   Intake/Output Summary (Last 24 hours) at 04/19/12 1712 Last data filed at 04/19/12 1000  Gross per 24 hour  Intake    740 ml  Output   2640 ml  Net  -1900 ml   Blood pressure 128/87, pulse 100, temperature 98.2 F (36.8 C), temperature source Oral, resp. rate 12, height 5\' 4"  (1.626 m), weight 134 lb 7.7 oz (61 kg), SpO2 95.00%. Temp:  [97.6 F (36.4 C)-99.1 F (37.3 C)] 98.2 F (36.8 C) (04/26 1200) Pulse Rate:  [100-123] 100  (04/26 0400) Resp:  [12-29] 12  (04/26 1609) BP: (126-157)/(85-101) 128/87 mmHg (04/26 1200) SpO2:  [91 %-97 %] 95 % (04/26 1609) FiO2 (%):  [2 %] 2 % (04/25 2014)  Physical Exam: General: Alert and awake, oriented x3, not in any acute distress. HEENT: anicteric sclera, pupils reactive to light and accommodation, EOMI CVS regular rate, normal r,  no murmur rubs or gallops Chest: clear to auscultation bilaterally, no wheezing, rales or rhonchi Abdomen: soft nontender, nondistended, normal bowel sounds, Skin/soft tissue neck is with dressing and postoperative drain, left  arm with dressing, right ankle joint is swollen and tender Right thigh with less tenderness Neuro: nonfocal, strength and sensation intact    Neuro: nonfocal  Lab Results:  Basename 04/18/12 1012 04/17/12 1042  WBC 15.0* 13.9*  HGB 11.3* 11.2*  HCT 33.5* 33.4*  PLT 221 309    BMET  Basename 04/18/12 1012  NA 132*  K 4.5  CL 98  CO2 19  GLUCOSE 84  BUN 9  CREATININE 0.64  CALCIUM 8.1*    Micro Results: Recent Results (from the past 240 hour(s))  CULTURE, BLOOD (ROUTINE X 2)     Status: Normal (Preliminary result)   Collection Time   04/13/12  1:28 AM      Component Value Range Status Comment   Specimen Description BLOOD RIGHT HAND   Final    Special Requests BOTTLES DRAWN AEROBIC AND ANAEROBIC [facility] EACH   Final    Culture  Setup Time [id number]   Final    Culture      Final    Value: GROUP A STREP (S.PYOGENES) ISOLATED     Note: CRITICAL RESULT CALLED TO, READ BACK BY AND VERIFIED WITH: RN D. CELANO ON 04/14/12 AT 1050 BY DTERRY     Note: Gram Stain Report Called to,Read Back By and Verified With: RN M. TROGDEN ON 04/13/12 AT 1930 BY DTERRY   Report Status PENDING   Incomplete   CULTURE, BLOOD (ROUTINE X 2)     Status: Normal (Preliminary result)   Collection Time   04/13/12  1:34 AM      Component Value Range Status Comment   Specimen Description BLOOD RIGHT ARM   Final    Special Requests BOTTLES DRAWN AEROBIC AND ANAEROBIC 10CC EACH   Final    Culture  Setup Time [id number]   Final    Culture     Final    Value: GROUP A STREP (S.PYOGENES) ISOLATED     Note: CRITICAL RESULT CALLED TO, READ BACK BY AND VERIFIED WITH: RN D. CELANO ON 04/14/12 AT 1050 BY DTERRY     Note: Gram Stain Report Called to,Read Back By and Verified With: RN M. TROGDEN ON 04/13/12 AT 1930 BY DTERRY   Report Status PENDING   Incomplete   CULTURE, BLOOD (ROUTINE X 2)     Status: Normal (Preliminary result)   Collection Time   04/17/12  4:00 PM      Component Value Range Status Comment   Specimen Description BLOOD RIGHT HAND   Final    Special Requests BOTTLES DRAWN AEROBIC AND ANAEROBIC 10CC   Final    Culture  Setup Time [id number]   Final    Culture     Final    Value:        BLOOD CULTURE RECEIVED NO GROWTH TO DATE CULTURE WILL BE HELD FOR 5 DAYS BEFORE ISSUING A FINAL NEGATIVE REPORT   Report Status PENDING   Incomplete   CULTURE, BLOOD (ROUTINE X 2)     Status: Normal (Preliminary result)   Collection Time   04/17/12  4:05 PM      Component Value Range Status Comment   Specimen Description BLOOD RIGHT HAND   Final    Special Requests BOTTLES DRAWN AEROBIC AND ANAEROBIC 10CC   Final    Culture  Setup Time [id number]   Final    Culture     Final    Value:        BLOOD CULTURE RECEIVED NO GROWTH TO DATE  CULTURE WILL BE HELD FOR 5 DAYS BEFORE ISSUING A FINAL NEGATIVE REPORT    Report Status PENDING   Incomplete   SURGICAL PCR SCREEN     Status: Normal   Collection Time   04/17/12  9:32 PM      Component Value Range Status Comment   MRSA, PCR NEGATIVE  NEGATIVE  Final    Staphylococcus aureus NEGATIVE  NEGATIVE  Final   GRAM STAIN     Status: Normal   Collection Time   04/17/12 11:48 PM      Component Value Range Status Comment   Specimen Description ABSCESS NECK RIGHT   Final    Special Requests PATIENT ON FOLLOWING UNASYN   Final    Gram Stain     Final    Value: FEW WBC PRESENT, PREDOMINANTLY PMN CORRECTED ON 04/25 AT 1554: PREVIOUSLY REPORTED AS NO WBC SEEN     RARE GRAM POSITIVE COCCI IN PAIRS IN CHAINS CORRECTED ON 04/25 AT 1555: PREVIOUSLY REPORTED AS NO ORGANISMS SEEN     CALLED [facility] Mendel Corning RN 04/18/12 0040 ACAINJ     REPORT CORRECTED AND CALLED TO BETH BRADT, RN 04/18/12 1555 BY K SCHULTZ   Report Status 04/18/2012 FINAL   Final   ANAEROBIC CULTURE     Status: Normal (Preliminary result)   Collection Time   04/17/12 11:48 PM      Component Value Range Status Comment   Specimen Description ABSCESS NECK RIGHT   Final    Special Requests PATIENT ON FOLLOWING UNASYN   Final    Gram Stain PENDING   Incomplete    Culture     Final    Value: NO ANAEROBES ISOLATED; CULTURE IN PROGRESS FOR 5 DAYS   Report Status PENDING   Incomplete   CULTURE, ROUTINE-ABSCESS     Status: Normal (Preliminary result)   Collection Time   04/17/12 11:48 PM      Component Value Range Status Comment   Specimen Description ABSCESS NECK RIGHT   Final    Special Requests PATIENT ON FOLLOWING UNASYN   Final    Gram Stain     Final    Value: FEW WBC PRESENT, PREDOMINANTLY PMN     RARE GRAM POSITIVE COCCI     IN PAIRS IN CHAINS Performed at [facility] Gram Stain Report Called to,Read Back By and Verified With: Gram Stain Report Called to,Read Back By and Verified With: [facility] JOHNSON J RN 04/18/12 0040 ACAINJ Gram Stain previously reported as No WBC seen      and No Organisms  seen CORRECTED RESULTS CALLED TO: KIM S AT [facility] MICRO [id number] 1300 BY WOODB   Culture Culture reincubated for better growth   Final    Report Status PENDING   Incomplete   CULTURE, ROUTINE-ABSCESS     Status: Normal (Preliminary result)   Collection Time   04/18/12  9:19 PM      Component Value Range Status Comment   Specimen Description ABSCESS GROIN LEFT   Final    Special Requests PSOAS ASPIRATION   Final    Gram Stain     Final    Value: MODERATE WBC PRESENT,BOTH PMN AND MONONUCLEAR     NO SQUAMOUS EPITHELIAL CELLS SEEN     RARE GRAM POSITIVE COCCI IN PAIRS   Culture NO GROWTH   Final    Report Status PENDING   Incomplete     Studies/Results: Ct Abdomen Pelvis Wo Contrast  04/17/2012  *RADIOLOGY REPORT*  Clinical Data: Left groin pain  CT ABDOMEN AND PELVIS WITHOUT CONTRAST  Technique:  Multidetector CT imaging of the abdomen and pelvis was performed following the standard protocol without intravenous contrast.  Comparison: None.  Findings: Bilateral pleural effusions are noted with associated compressive atelectasis.  Small pericardial effusion is present.  Unenhanced CT was performed per clinician order.  Lack of IV contrast limits sensitivity and specificity, especially for evaluation of abdominal/pelvic solid viscera.  There is minimal retained contrast within nondilated renal collecting systems from contrast enhanced exam performed separately today.  There is minimal nonspecific peri nephric stranding bilaterally, right greater than left.  Hyperdense area of the peripheral renal cortex of the right upper renal pole image 32 measures 6 mm.  Unenhanced liver, spleen, pancreas, and adrenal glands are normal.  No hydronephrosis.  The bowel is grossly normal.  Bladder is normal.  There is fusiform enlargement and inhomogeneity of the central hypodensity involving the left iliopsoas muscle.  The more focal well-defined area of hypodensity in the inferior portion of this muscle, overlying the region  of the left femoral head, measures 2.8 x 2.0 cm.  A small amount of fluid is noted tracking along the interfascial space of the upper left quadriceps muscles.  Mild subcutaneous edema is present.  No lymphadenopathy.  Lumbar fusion hardware again noted.  IMPRESSION: Fusiform enlargement and hypodensity of the left iliopsoas muscle. Differential considerations include abscess, sterile fluid collection, or hematoma.  Findings discussed with Dr. Lazarus Salines by Dr. Chilton Si 04/17/12 825pm.  Small bilateral pleural effusions and associated compressive atelectasis.  Small pericardial effusion.  Original Report Authenticated By: Harrel Lemon, M.D.   [place] Abscess Drain  04/19/2012  *RADIOLOGY REPORT*  Clinical Data: Multiple infectious sites.  Left iliopsoas abscess suggested on CT.  ULTRASOUND GUIDED ASPIRATION  Comparison: CT from the previous day  Technique and findings:  Survey ultrasound of the left iliopsoas and hip region was performed and a   complex intramuscular collection was identified corresponding to the hypodensity seen on CT.  An appropriate skin entry site was determined.  Site was marked, prepped with Betadine, draped in usual sterile fashion, infiltrated locally with 1% lidocaine. Under real time ultrasound guidance, an 18 gauge spinal needle was advanced into various facets of   the collection.  Only scant amount of bloody fluid could be aspirated.  Sample sent for Gram stain, culture and sensitivity. The patient tolerated the procedure well. No immediate complication.  IMPRESSION:  Aspiration of left iliopsoas process, returning only scant amount of bloody fluid, sent for Gram stain and culture.  Original Report Authenticated By: Osa Craver, M.D.   Mr Hand Left W Wo Contrast  04/18/2012  *RADIOLOGY REPORT*  Clinical Data: Sepsis.  Cellulitis.  MRI OF THE LEFT HAND WITHOUT AND WITH CONTRAST  Technique:  Multiplanar, multisequence MR imaging was performed both before and after administration  of intravenous contrast.  Contrast: 15mL MULTIHANCE GADOBENATE DIMEGLUMINE 529 MG/ML IV SOLN  Comparison: CT scan dated 04/13/2012 and radiographs dated 04/12/2012  Findings: There is a 4.9 x 4.0 x 0.8 cm abscess between the pronator quadratus muscle and the flexor digitorum muscles in the distal left forearm.  There is also fluid and abnormal enhancement in the distal radial ulnar joint consistent with a septic joint.  The patient has an effusion in the radiocarpal joint with enhancement of the synovium of that joint as well.  No other discrete abscesses in the forearm or hand.  No osteomyelitis.  The diffuse  edema and enhancement of the subcutaneous tissues of the distal forearm and hand is consistent with cellulitis. There is also slight enhancement of the muscles just above the abscess consistent with myositis.  IMPRESSION: Focal abscess in the volar aspect of the distal forearm between the flexor digitorum muscles and the pronator quadratus.  Probable septic distal radial ulnar joint and radiocarpal joint.  Diffuse cellulitis.  Original Report Authenticated By: Gwynn Burly, M.D.      Assessment/Plan: [name] is a 51 y.o. male with:   1)SEVERE GROUP A STREPTOCOCCAL INFECTION WITH BACTEREMIA AND MX METASATIC FOCI OF INFECTION  A)NEck abscess, mediastinal abscess with septic [place] joint:  --he is sp surgery by Dr. Lazarus Salines and hopefully he was able to drain all of the fluid out from the mediastinum as well:  --worry about the [facility] joint --will need protracted therapy and repeat imaging along the way  B) Hand infection: Greatly appreciate Ortho hand surgery here  C) Thigh : appreciate orthopedics and IR help --will ;ikley need repeat imaging down the road    D) Right ankle: SHOULD PRESUME TO BE SEPTIC given severe disseminated GAS infection in blood and other sites described above. He needs this joint aspirated and sent for white blood cell count and differential at . Note a culture  would not be likely of much yield in the ankle joint.. ll  E) Bacteremia: --ensure repeat blood cutlures clearing  CONTINUE UNASYN and will DC clindamycin for now ) --  2) ? Cholecystitis: is this incidental finding or related to his systemic disease He is broadly covered for usual suspects in this area   3) screening: HIV negative hep serologies. Check a1c   LOS: 7 days   Acey Lav 04/19/2012, 5:12 PM

## 2012-04-20 ENCOUNTER — Encounter (HOSPITAL_COMMUNITY): Payer: Self-pay | Admitting: *Deleted

## 2012-04-20 ENCOUNTER — Other Ambulatory Visit: Payer: Self-pay

## 2012-04-20 LAB — BASIC METABOLIC PANEL
BUN: 11 mg/dL (ref 6–23)
Creatinine, Ser: 0.5 mg/dL (ref 0.50–1.35)
GFR calc Af Amer: >90 mL/min (ref >90)
GFR calc non Af Amer: >90 mL/min (ref >90)

## 2012-04-20 LAB — CARDIAC PANEL(CRET KIN+CKTOT+MB+TROPI)
Relative Index: 0.5 (ref 0.0–2.5)
Troponin I: <0.3 ng/mL (ref <0.30)

## 2012-04-20 LAB — CBC
HCT: 25 % — ABNORMAL LOW (ref 39.0–52.0)
MCHC: 33.2 g/dL (ref 30.0–36.0)
MCV: 67.2 fL — ABNORMAL LOW (ref 78.0–100.0)
RDW: 15.1 % (ref 11.5–15.5)

## 2012-04-20 LAB — CULTURE, ROUTINE-ABSCESS

## 2012-04-20 MED ORDER — CALCIUM GLUCONATE 10 % IV SOLN
1.0000 g | Freq: Once | INTRAVENOUS | Status: AC
  Administered 2012-04-20: 1 g via INTRAVENOUS
  Filled 2012-04-20: qty 10

## 2012-04-20 NOTE — Progress Notes (Signed)
Subjective: POD#3 from I&D right neck abscess, culture and sensitivities still pending, looks like staph/GPC is pairs on gram stain, WBC continues to decrease.  Objective: Vital signs in last 24 hours: Temp:  [97.9 F (36.6 C)-98.4 F (36.9 C)] 98.4 F (36.9 C) (04/27 0753) Pulse Rate:  [80-101] 84  (04/27 0400) Resp:  [12-20] 20  (04/26 2000) BP: (109-131)/(77-87) 131/84 mmHg (04/27 0753) SpO2:  [94 %-97 %] 95 % (04/27 0400)  Changed neck dressing with new Kerlix. Has some mild serosanguinous drainage, no frank pus. Tender to palpation, incision is closed loosely with 4 penrose drains in place. Right SCM is edematous but neck and chest edema are improving slowly.  @LABLAST2 (wbc:2,hgb:2,hct:2,plt:2)  Basename 04/20/12 0430 04/18/12 1012  NA 155* 132*  K 3.2* 4.5  CL 109 98  CO2 20 19  GLUCOSE 81 84  BUN 11 9  CREATININE 0.50 0.64  CALCIUM 5.6* 8.1*    Medications:  Scheduled Meds:   . ampicillin-sulbactam (UNASYN) IV  3 g Intravenous Q6H  . calcium gluconate  1 g Intravenous Once  . clindamycin (CLEOCIN) IV  900 mg Intravenous Q8H  . morphine   Intravenous Q4H  . nystatin  10 mL Oral QID  . potassium chloride  40 mEq Oral Daily  . sodium chloride  3 mL Intravenous Q12H   Continuous Infusions:   . sodium chloride 100 mL/hr at 04/19/12 1800   PRN Meds:.diphenhydrAMINE, diphenhydrAMINE, ketorolac, naloxone, ondansetron (ZOFRAN) IV, ondansetron, phenol, sodium chloride  Assessment/Plan: POD#3 from I&D right neck abscess. Will wait for cultures to finalize before removing penrose drains and monitor WBC and temp. Patient is improving clinically.   LOS: 8 days   Melvenia Beam 04/20/2012, 8:28 AM

## 2012-04-20 NOTE — Progress Notes (Signed)
Occupational Therapy Treatment Patient Details Name: [name] MRN: [id number] DOB: November 16, 1961 Today's Date: 04/20/2012 Time: 1660-6301 OT Time Calculation (min): 15 min  OT Assessment / Plan / Recommendation Comments on Treatment Session Pt. with increased pain today and encouraged pt. to complete left UE exercises throughout the day    Follow Up Recommendations  Home health OT;Outpatient OT    Equipment Recommendations  Other (comment) (TBD)    Frequency Min 3X/week   Plan Discharge plan remains appropriate    Precautions / Restrictions Precautions Precautions: None Restrictions Weight Bearing Restrictions: No   Pertinent Vitals/Pain 8/10 neck       OT Goals Acute Rehab OT Goals OT Goal Formulation: With patient Time For Goal Achievement: 05/03/12 Potential to Achieve Goals: Good Arm Goals Pt Will Tolerate PROM: to maintain range of motion;without pain;to decrease contracture;Left upper extremity;5 reps Arm Goal: PROM - Progress: Progressing toward goal Additional Arm Goal #1: Pt will be Independent with LUE SROM shoulder, elbow, forearm, and digits; as well as keeping LUE elevated appropriately Arm Goal: Additional Goal #1 - Progress: Progressing toward goals  Visit Information  Last OT Received On: 04/20/12 Assistance Needed: +1          Cognition  Overall Cognitive Status: Appears within functional limits for tasks assessed/performed Arousal/Alertness: Awake/alert Orientation Level: Appears intact for tasks assessed Behavior During Session: [facility] for tasks performed    Mobility Bed Mobility Bed Mobility: Not assessed Transfers Transfers: Not assessed   Exercises General Exercises - Upper Extremity Elbow Flexion: AROM;10 reps;Left;Supine Elbow Extension: AROM;Left;5 reps;Supine Digit Composite Flexion: AROM;AAROM;PROM;10 reps;Supine;Left Composite Extension: AAROM;PROM;Left;5 reps;Supine Hand Exercises Forearm Supination: AAROM;5  reps;Left;Supine Digit Lifts: AAROM;5 reps;Supine Other Exercises Other Exercises: Digit flex/ext SROM and elbow flex/ext PROM/SROM 10 reps every waking hour; as well as keeping arm propped so that elbow is significantly lower than hand     End of Session OT - End of Session Activity Tolerance: Patient limited by pain Patient left: in bed;with call bell/phone within reach Nurse Communication: Other (comment) (exercises to complete throughout the day)   Ayan Yankey, OTR/L Pager [phone] 04/20/2012, 1:08 PM

## 2012-04-20 NOTE — Progress Notes (Signed)
[facility]   Subjective:  Throat feels much better. Left arm heavy with dressing and upset about it limiting him.  Objective: Vital signs in last 24 hours: Temp:  [97.9 F (36.6 C)-98.5 F (36.9 C)] 98.5 F (36.9 C) (04/27 1200) Pulse Rate:  [80-101] 84  (04/27 0400) Resp:  [17-20] 17  (04/27 1158) BP: (109-131)/(71-84) 111/71 mmHg (04/27 1200) SpO2:  [94 %-97 %] 97 % (04/27 1158) Weight change:  Last BM Date: 04/17/12  Intake/Output from previous day: 04/26 0701 - 04/27 0700 In: 5880 [P.O.:1680; I.V.:3900; IV Piggyback:300] Out: 2000 [Urine:2000] Intake/Output this shift: Total I/O In: -  Out: 1250 [Urine:1250]  General appearance: alert, cooperative, appears stated age, mild distress and toxic Throat: erythematous with white spots, tongue is coated and looks consistent with oral Candida Neck: drains present in base of right neck, mild blood tinged drainage Resp: Mostly clear to auscultation except for diminished in the bases with bibasilar crackles Cardio: regular rate and rhythm, S1, S2 normal, no murmur, click, rub or gallop GI: soft, non-tender; bowel sounds normal; no masses,  no organomegaly Extremities: left forearm in dressing, right foot still edematous with piting but non- tender   Lab Results:  Basename 04/20/12 0430 04/18/12 1012  WBC 10.7* 15.0*  HGB 8.3* 11.3*  HCT 25.0* 33.5*  PLT 318 221   BMET  Basename 04/20/12 0430 04/18/12 1012  NA 155* 132*  K 3.2* 4.5  CL 109 98  CO2 20 19  GLUCOSE 81 84  BUN 11 9  CREATININE 0.50 0.64  CALCIUM 5.6* 8.1*    Studies/Results: [place] Abscess Drain  04/19/2012  *RADIOLOGY REPORT*  Clinical Data: Multiple infectious sites.  Left iliopsoas abscess suggested on CT.  ULTRASOUND GUIDED ASPIRATION  Comparison: CT from the previous day  Technique and findings:  Survey ultrasound of the left iliopsoas and hip region was performed and a   complex intramuscular collection was identified corresponding to the  hypodensity seen on CT.  An appropriate skin entry site was determined.  Site was marked, prepped with Betadine, draped in usual sterile fashion, infiltrated locally with 1% lidocaine. Under real time ultrasound guidance, an 18 gauge spinal needle was advanced into various facets of   the collection.  Only scant amount of bloody fluid could be aspirated.  Sample sent for Gram stain, culture and sensitivity. The patient tolerated the procedure well. No immediate complication.  IMPRESSION:  Aspiration of left iliopsoas process, returning only scant amount of bloody fluid, sent for Gram stain and culture.  Original Report Authenticated By: Osa Craver, M.D.    Medications: I have reviewed the patient's current medications.  Assessment/Plan:  Active Problems:  Bacteremia due to Group A Streptococcus/ Sepsis *Has disseminated streptococcal infection- cont Unasyn *Patient gives history of upper respiratory infection on April 13 for which he sought medical care but did not receive antibiotics and it is presumed he was treated for a viral upper respiratory infection. He endorses he did not get better from that in fact and subsequently developed more constitutional symptoms and later presented to our hospital with current condition. *Appreciate infectious disease assistance *Agree with empiric coverage with clindamycin and Unasyn until culture sensitivities returned * 2-D echocardiogram demonstrates a small to moderate pericardial effusion circumferential to the heart as well as a left pleural effusion. At this time patient does not have cardiac tamponade physiology. No vegetation is seen. Dr. Dietrich Pates with [facility] cardiology reviewed the echocardiogram. In her opinion the effusion is actually small  and again no tamponade physiology is seen.   Neck abscess with extension into right chest wall and mediastinum *Appreciate ENT assistance *Postoperatively seems stable   Acute respiratory failure  with hypoxia/ Pleural effusion, bilateral with compressive atelectasis *Continue supportive care with oxygen and pulmonary toileting *At risk for developing HCAP- incentive spirometry- have recommended he get out of bed.   Cellulitis and abscess of Left hand/Left Psoas abscess/ Right ankle and thigh swelling with pain and erythema- ? Evolving abscess *All appear to be evident of seeding of group A streptococcal type bacteremia *Dr Melvyn Novas apparently aspirated the ankle in the OR and obtained clear fluid.  *Status post aspiration of iliopsoas abscess and interventional radiology 04/18/2012.  Thrush Begin nystatin swish and swallow   Transaminitis- likely due to sepsis *Improving with treatment and rehydration continue to follow   Anemia- ? Hemodilution *Hemoglobin has drifted down by rehydration  MCV is low- will check iron levels.    Hypokalemia *Resolved  Hyponatremia *Continue to follow-not severe  Disposition *Remaining stepdown    LOS: 8 days    Calvert Cantor, MD [phone]  04/20/2012, 5:22 PM

## 2012-04-20 NOTE — Progress Notes (Signed)
Critical Value Alert: Calcium 5.6 M. Lynch ([facility] on call) notified.  Ca gluconate ordered and given. Dionisio Paschal RN

## 2012-04-21 LAB — BASIC METABOLIC PANEL
BUN: 10 mg/dL (ref 6–23)
CO2: 21 mEq/L (ref 19–32)
GFR calc non Af Amer: >90 mL/min (ref >90)
Glucose, Bld: 106 mg/dL — ABNORMAL HIGH (ref 70–99)
Potassium: 5.9 mEq/L — ABNORMAL HIGH (ref 3.5–5.1)
Sodium: 130 mEq/L — ABNORMAL LOW (ref 135–145)

## 2012-04-21 LAB — CARDIAC PANEL(CRET KIN+CKTOT+MB+TROPI)
Relative Index: 0.5 (ref 0.0–2.5)
Relative Index: 0.5 (ref 0.0–2.5)
Total CK: 239 U/L — ABNORMAL HIGH (ref 7–232)
Troponin I: <0.3 ng/mL (ref <0.30)

## 2012-04-21 LAB — CBC
HCT: 28.3 % — ABNORMAL LOW (ref 39.0–52.0)
Hemoglobin: 9.1 g/dL — ABNORMAL LOW (ref 13.0–17.0)
MCH: 21.5 pg — ABNORMAL LOW (ref 26.0–34.0)
MCHC: 32.2 g/dL (ref 30.0–36.0)
RBC: 4.23 MIL/uL (ref 4.22–5.81)

## 2012-04-21 LAB — FERRITIN: Ferritin: 944 ng/mL — ABNORMAL HIGH (ref 22–322)

## 2012-04-21 MED ORDER — MUPIROCIN 2 % EX OINT
TOPICAL_OINTMENT | Freq: Two times a day (BID) | CUTANEOUS | Status: DC
  Administered 2012-04-21 – 2012-04-29 (×17): via TOPICAL
  Administered 2012-04-30: 1 via TOPICAL
  Administered 2012-04-30 – 2012-05-03 (×7): via TOPICAL
  Administered 2012-05-04: 1 via TOPICAL
  Administered 2012-05-04 – 2012-05-06 (×4): via TOPICAL
  Filled 2012-04-21 (×2): qty 22

## 2012-04-21 MED ORDER — ENOXAPARIN SODIUM 40 MG/0.4ML ~~LOC~~ SOLN
40.0000 mg | SUBCUTANEOUS | Status: DC
  Administered 2012-04-21 – 2012-05-07 (×16): 40 mg via SUBCUTANEOUS
  Filled 2012-04-21 (×17): qty 0.4

## 2012-04-21 MED ORDER — MORPHINE SULFATE 2 MG/ML IJ SOLN
2.0000 mg | INTRAMUSCULAR | Status: DC | PRN
Start: 2012-04-21 — End: 2012-05-07
  Administered 2012-04-21 – 2012-04-25 (×14): 2 mg via INTRAVENOUS
  Administered 2012-04-25: 3 mg via INTRAVENOUS
  Administered 2012-04-26 – 2012-05-06 (×10): 2 mg via INTRAVENOUS
  Filled 2012-04-21 (×8): qty 1
  Filled 2012-04-21: qty 2
  Filled 2012-04-21 (×17): qty 1

## 2012-04-21 NOTE — Progress Notes (Signed)
Subjective: Pt continues to feel better   Antibiotics:  Anti-infectives     Start     Dose/Rate Route Frequency Ordered Stop   04/17/12 1600   clindamycin (CLEOCIN) IVPB 900 mg  Status:  Discontinued        900 mg 100 mL/hr over 30 Minutes Intravenous Every 8 hours 04/17/12 1454 04/21/12 1249   04/17/12 1600  Ampicillin-Sulbactam (UNASYN) 3 g in sodium chloride 0.9 % 100 mL IVPB       3 g 100 mL/hr over 60 Minutes Intravenous Every 6 hours 04/17/12 1455     04/14/12 1400   ceFAZolin (ANCEF) IVPB 1 g/50 mL premix  Status:  Discontinued        1 g 100 mL/hr over 30 Minutes Intravenous 3 times per day 04/14/12 1057 04/17/12 1426   04/13/12 1900   vancomycin (VANCOCIN) 500 mg in sodium chloride 0.9 % 100 mL IVPB  Status:  Discontinued        500 mg 100 mL/hr over 60 Minutes Intravenous Every 12 hours 04/13/12 1601 04/14/12 1058   04/13/12 0815   moxifloxacin (AVELOX) IVPB 400 mg        400 mg 250 mL/hr over 60 Minutes Intravenous  Once 04/13/12 0805 04/13/12 0953   04/13/12 0500   vancomycin (VANCOCIN) IVPB 1000 mg/200 mL premix  Status:  Discontinued        1,000 mg 200 mL/hr over 60 Minutes Intravenous Every 12 hours 04/13/12 0453 04/13/12 1414   04/13/12 0445   vancomycin (VANCOCIN) IVPB 1000 mg/200 mL premix        1,000 mg 200 mL/hr over 60 Minutes Intravenous  Once 04/13/12 0442 04/13/12 0607          Medications: Scheduled Meds:    . ampicillin-sulbactam (UNASYN) IV  3 g Intravenous Q6H  . morphine   Intravenous Q4H  . mupirocin ointment   Topical BID  . nystatin  10 mL Oral QID  . potassium chloride  40 mEq Oral Daily  . sodium chloride  3 mL Intravenous Q12H  . DISCONTD: clindamycin (CLEOCIN) IV  900 mg Intravenous Q8H   Continuous Infusions:    . DISCONTD: sodium chloride 1,000 mL (04/21/12 0624)   PRN Meds:.diphenhydrAMINE, diphenhydrAMINE, ketorolac, naloxone, ondansetron (ZOFRAN) IV, ondansetron, phenol, sodium chloride   Objective: Weight change:    Intake/Output Summary (Last 24 hours) at 04/21/12 1250 Last data filed at 04/21/12 0800  Gross per 24 hour  Intake 2396.67 ml  Output   2425 ml  Net -28.33 ml   Blood pressure 126/82, pulse 94, temperature 99.3 F (37.4 C), temperature source Oral, resp. rate 17, height 5\' 4"  (1.626 m), weight 134 lb 7.7 oz (61 kg), SpO2 97.00%. Temp:  [98 F (36.7 C)-100.3 F (37.9 C)] 99.3 F (37.4 C) (04/28 1200) Pulse Rate:  [90-104] 94  (04/28 1200) Resp:  [10-24] 17  (04/28 1200) BP: (124-133)/(71-85) 126/82 mmHg (04/28 1200) SpO2:  [94 %-99 %] 97 % (04/28 1200)  Physical Exam: General: Alert and awake, oriented x3, not in any acute distress. HEENT: anicteric sclera, pupils reactive to light and accommodation, EOMI CVS regular rate, normal r,  no murmur rubs or gallops Chest: clear to auscultation bilaterally, no wheezing, rales or rhonchi Abdomen: soft nontender, nondistended, normal bowel sounds, Skin/soft tissue neck is with dressing and postoperative drain, left arm with dressing, right ankle joint is swollen and tender, neck surgical site clean Right thigh with less tenderness Neuro: nonfocal, strength and sensation intact  Neuro: nonfocal  Lab Results:  Basename 04/21/12 1025 04/20/12 0430  WBC 11.9* 10.7*  HGB 9.1* 8.3*  HCT 28.3* 25.0*  PLT 430* 318    BMET  Basename 04/21/12 1025 04/20/12 0430  NA 130* 155*  K 5.9* 3.2*  CL 100 109  CO2 21 20  GLUCOSE 106* 81  BUN 10 11  CREATININE 0.54 0.50  CALCIUM 7.8* 5.6*    Micro Results: Recent Results (from the past 240 hour(s))  CULTURE, BLOOD (ROUTINE X 2)     Status: Normal (Preliminary result)   Collection Time   04/13/12  1:28 AM      Component Value Range Status Comment   Specimen Description BLOOD RIGHT HAND   Final    Special Requests BOTTLES DRAWN AEROBIC AND ANAEROBIC [facility] EACH   Final    Culture  Setup Time [id number]   Final    Culture     Final    Value: GROUP A STREP (S.PYOGENES) ISOLATED      Note: CRITICAL RESULT CALLED TO, READ BACK BY AND VERIFIED WITH: RN D. CELANO ON 04/14/12 AT 1050 BY DTERRY     Note: Gram Stain Report Called to,Read Back By and Verified With: RN M. TROGDEN ON 04/13/12 AT 1930 BY DTERRY   Report Status PENDING   Incomplete   CULTURE, BLOOD (ROUTINE X 2)     Status: Normal (Preliminary result)   Collection Time   04/13/12  1:34 AM      Component Value Range Status Comment   Specimen Description BLOOD RIGHT ARM   Final    Special Requests BOTTLES DRAWN AEROBIC AND ANAEROBIC 10CC EACH   Final    Culture  Setup Time [id number]   Final    Culture     Final    Value: GROUP A STREP (S.PYOGENES) ISOLATED     Note: CRITICAL RESULT CALLED TO, READ BACK BY AND VERIFIED WITH: RN D. CELANO ON 04/14/12 AT 1050 BY DTERRY     Note: Gram Stain Report Called to,Read Back By and Verified With: RN M. TROGDEN ON 04/13/12 AT 1930 BY DTERRY   Report Status PENDING   Incomplete   CULTURE, BLOOD (ROUTINE X 2)     Status: Normal (Preliminary result)   Collection Time   04/17/12  4:00 PM      Component Value Range Status Comment   Specimen Description BLOOD RIGHT HAND   Final    Special Requests BOTTLES DRAWN AEROBIC AND ANAEROBIC 10CC   Final    Culture  Setup Time [id number]   Final    Culture     Final    Value:        BLOOD CULTURE RECEIVED NO GROWTH TO DATE CULTURE WILL BE HELD FOR 5 DAYS BEFORE ISSUING A FINAL NEGATIVE REPORT   Report Status PENDING   Incomplete   CULTURE, BLOOD (ROUTINE X 2)     Status: Normal (Preliminary result)   Collection Time   04/17/12  4:05 PM      Component Value Range Status Comment   Specimen Description BLOOD RIGHT HAND   Final    Special Requests BOTTLES DRAWN AEROBIC AND ANAEROBIC 10CC   Final    Culture  Setup Time [id number]   Final    Culture     Final    Value:        BLOOD CULTURE RECEIVED NO GROWTH TO DATE CULTURE WILL BE HELD FOR 5 DAYS BEFORE ISSUING A FINAL NEGATIVE REPORT  Report Status PENDING   Incomplete   SURGICAL PCR  SCREEN     Status: Normal   Collection Time   04/17/12  9:32 PM      Component Value Range Status Comment   MRSA, PCR NEGATIVE  NEGATIVE  Final    Staphylococcus aureus NEGATIVE  NEGATIVE  Final   GRAM STAIN     Status: Normal   Collection Time   04/17/12 11:48 PM      Component Value Range Status Comment   Specimen Description ABSCESS NECK RIGHT   Final    Special Requests PATIENT ON FOLLOWING UNASYN   Final    Gram Stain     Final    Value: FEW WBC PRESENT, PREDOMINANTLY PMN CORRECTED ON 04/25 AT 1554: PREVIOUSLY REPORTED AS NO WBC SEEN     RARE GRAM POSITIVE COCCI IN PAIRS IN CHAINS CORRECTED ON 04/25 AT 1555: PREVIOUSLY REPORTED AS NO ORGANISMS SEEN     CALLED [facility] Mendel Corning RN 04/18/12 0040 ACAINJ     REPORT CORRECTED AND CALLED TO BETH BRADT, RN 04/18/12 1555 BY K SCHULTZ   Report Status 04/18/2012 FINAL   Final   ANAEROBIC CULTURE     Status: Normal (Preliminary result)   Collection Time   04/17/12 11:48 PM      Component Value Range Status Comment   Specimen Description ABSCESS NECK RIGHT   Final    Special Requests PATIENT ON FOLLOWING UNASYN   Final    Gram Stain PENDING   Incomplete    Culture     Final    Value: NO ANAEROBES ISOLATED; CULTURE IN PROGRESS FOR 5 DAYS   Report Status PENDING   Incomplete   CULTURE, ROUTINE-ABSCESS     Status: Normal   Collection Time   04/17/12 11:48 PM      Component Value Range Status Comment   Specimen Description ABSCESS NECK RIGHT   Final    Special Requests PATIENT ON FOLLOWING UNASYN   Final    Gram Stain     Final    Value: FEW WBC PRESENT, PREDOMINANTLY PMN     RARE GRAM POSITIVE COCCI     IN PAIRS IN CHAINS Performed at [facility] Gram Stain Report Called to,Read Back By and Verified With: Gram Stain Report Called to,Read Back By and Verified With: [facility] J[facility]NS[facility] J RN 04/18/12 0040 ACAINJ Gram Stain previously reported as No WBC seen      and No Organisms seen CORRECTED RESULTS CALLED TO: KIM S AT [facility] MICRO [id number] BY WOODB     Culture RARE GROUP A STREP (S.PYOGENES) ISOLATED   Final    Report Status 04/20/2012 FINAL   Final   CULTURE, ROUTINE-ABSCESS     Status: Normal (Preliminary result)   Collection Time   04/18/12  9:19 PM      Component Value Range Status Comment   Specimen Description ABSCESS GROIN LEFT   Final    Special Requests PSOAS ASPIRATION   Final    Gram Stain     Final    Value: MODERATE WBC PRESENT,BOTH PMN AND MONONUCLEAR     NO SQUAMOUS EPITHELIAL CELLS SEEN     RARE GRAM POSITIVE COCCI IN PAIRS   Culture NO GROWTH 2 DAYS   Final    Report Status PENDING   Incomplete     Studies/Results: No results found.    Assessment/Plan: [name] is a 51 y.o. male with:   1)SEVERE GROUP A STREPTOCOCCAL  INFECTION WITH BACTEREMIA AND MX METASATIC FOCI OF INFECTION  A)NEck abscess, mediastinal abscess with septic [place] joint:  --he is sp surgery by Dr. Lazarus Salines and hopefully he was able to drain all of the fluid out from the mediastinum as well:  --worry about the [facility] joint --will need protracted therapy and repeat imaging along the way  B) Hand infection: Greatly appreciate Ortho hand surgery here  C) Thigh : appreciate orthopedics and IR help --will ;ikley need repeat imaging down the road    D) Right ankle: aspiration attempted by Dr. Melvyn Novas --will get MRI to delineate any pathology at this site  E) Bacteremia: --ensure repeat blood cutlures clearing  --CONTINUE UNASYN and will DC clindamycin will likely change to rocephin once daily dosing for convenience at DC - 2) ? Cholecystitis: is this incidental finding or related to his systemic disease He is broadly covered for usual suspects in this area   3) screening: HIV negative hep serologies. Check a1c   LOS: 9 days   Acey Lav 04/21/2012, 12:50 PM

## 2012-04-21 NOTE — Progress Notes (Signed)
[facility]   Subjective: 51 y/o presenting with swolled left arm and found to have strep group A bacteremia, abscess in left hand, left psoas and right neck extending to the mediastinum. He was also noted later to have edema of his right foot that was drained in the OR by Dr Melvyn Novas. Fluid looked non-infectious and therefore it was not sent to the lab.   He is doing well today and ambulated to the bathroom earlier. He did feel dizzy when getting up. He is requesting his foley be removed but tomorrow morning. I have discussed transferring him today and changing the PCA to IV Morpine push and d/cing telemetry. His wife, mother and father are present during this conversation.    Objective: Vital signs in last 24 hours: Temp:  [98.4 F (36.9 C)-100.3 F (37.9 C)] 99 F (37.2 C) (04/28 1700) Pulse Rate:  [89-104] 98  (04/28 1700) Resp:  [10-24] 20  (04/28 1700) BP: (124-130)/(71-84) 128/84 mmHg (04/28 1700) SpO2:  [94 %-99 %] 98 % (04/28 1700) Weight change:  Last BM Date: 04/17/12  Intake/Output from previous day: 04/27 0701 - 04/28 0700 In: 2496.7 [I.V.:2396.7; IV Piggyback:100] Out: 3925 [Urine:3925] Intake/Output this shift: Total I/O In: -  Out: 1450 [Urine:1450]  General appearance: alert, cooperative, appears stated age, mild distress and toxic Throat: trush improving, pharyns no longer erythematous Neck: drains removed from base of right neck, mild blood tinged drainage Resp:  clear to auscultation  Cardio: regular rate and rhythm, S1, S2 normal, no murmur, click, rub or gallop GI: soft, non-tender; bowel sounds normal; no masses,  no organomegaly Extremities: left forearm in dressing, right foot still edematous with piting but non- tender   Lab Results:  Basename 04/21/12 1025 04/20/12 0430  WBC 11.9* 10.7*  HGB 9.1* 8.3*  HCT 28.3* 25.0*  PLT 430* 318   BMET  Basename 04/21/12 1400 04/21/12 1025 04/20/12 0430  NA -- 130* 155*  K 4.2 5.9* --  CL --  100 109  CO2 -- 21 20  GLUCOSE -- 106* 81  BUN -- 10 11  CREATININE -- 0.54 0.50  CALCIUM -- 7.8* 5.6*    Studies/Results: No results found.  Medications: I have reviewed the patient's current medications.  Assessment/Plan:  Active Problems:  Bacteremia due to Group A Streptococcus/ Sepsis *Has disseminated streptococcal infection- cont antibiotics per ID *Patient gives history of upper respiratory infection on April 13 for which he sought medical care but did not receive antibiotics and it is presumed he was treated for a viral upper respiratory infection. He endorses he did not get better from that in fact and subsequently developed more constitutional symptoms and later presented to our hospital with current condition. *Appreciate infectious disease assistance * 2-D echocardiogram demonstrates a small to moderate pericardial effusion circumferential to the heart as well as a left pleural effusion. At this time patient does not have cardiac tamponade physiology. No vegetation is seen. Dr. Dietrich Pates with [facility] cardiology reviewed the echocardiogram. In her opinion the effusion is actually small and again no tamponade physiology is seen.   Neck abscess with extension into right chest wall and mediastinum *Appreciate ENT assistance *to follow up with Dr Maryellen Pile   Acute respiratory failure with hypoxia/ Pleural effusion, bilateral with compressive atelectasis *At risk for developing HCAP- incentive spirometry- have recommended he get out of bed.   Cellulitis and abscess of Left hand/Left Psoas abscess/ Right ankle and thigh swelling with pain and erythema- ? Evolving abscess *All appear  to be evident of seeding of group A streptococcal type bacteremia *Dr Melvyn Novas aspirated the ankle in the OR  *Status post aspiration of iliopsoas abscess and interventional radiology 04/18/2012.  Thrush Improving with nystatin swish and swallow   Transaminitis- likely due to sepsis *Improving  with treatment and rehydration continue to follow   Anemia- ? Hemodilution *Hemoglobin has drifted down by rehydration- iron levels low- will need to start PO iron after discussing with the patient.    Hypokalemia *Resolved- hold K for now  Hyponatremia *Continue to follow-not severe  Disposition Transfer to surgical floor today   LOS: 9 days    Calvert Cantor, MD [phone]  04/21/2012, 6:38 PM

## 2012-04-21 NOTE — Progress Notes (Signed)
Subjective: POD#4 from incision and drainage of right neck/chest abscess by Dr. Lazarus Salines. Cultures showing Group A strep (S. Pyogenes). Neck edema significantly improved, white count and temperature decreased.   Objective: Vital signs in last 24 hours: Temp:  [98 F (36.7 C)-100.3 F (37.9 C)] 99.7 F (37.6 C) (04/28 0300) Pulse Rate:  [90-97] 90  (04/28 0300) Resp:  [10-24] 24  (04/28 0300) BP: (111-133)/(71-85) 124/75 mmHg (04/28 0300) SpO2:  [94 %-98 %] 97 % (04/28 0300)  CN 2-12 intact and symmetric, awake and alert. Neck palpation reveals some tenderness but no purulence through penrose drains, only serosanguinous fluid. I removed all four drains and placed a new neck dressing on the right neck. Patient tolerated this well.  @LABLAST2 (wbc:2,hgb:2,hct:2,plt:2)  Basename 04/20/12 0430 04/18/12 1012  NA 155* 132*  K 3.2* 4.5  CL 109 98  CO2 20 19  GLUCOSE 81 84  BUN 11 9  CREATININE 0.50 0.64  CALCIUM 5.6* 8.1*    Medications: Scheduled Meds:   . ampicillin-sulbactam (UNASYN) IV  3 g Intravenous Q6H  . clindamycin (CLEOCIN) IV  900 mg Intravenous Q8H  . morphine   Intravenous Q4H  . nystatin  10 mL Oral QID  . potassium chloride  40 mEq Oral Daily  . sodium chloride  3 mL Intravenous Q12H   Continuous Infusions:   . DISCONTD: sodium chloride 1,000 mL (04/21/12 0624)   PRN Meds:.diphenhydrAMINE, diphenhydrAMINE, ketorolac, naloxone, ondansetron (ZOFRAN) IV, ondansetron, phenol, sodium chloride  Assessment/Plan: Doing well POD#4 from I&D right neck abscess, all four neck penrose drains removed today. Will allow neck incision to granulate in and heal by secondary intention given infection. Can follow up with Dr. Lazarus Salines in ~ 1 week for suture removal and wound check. Unasyn and clindamycin should give good coverage for the S. Pyogenes as this is typically sensitive to penicillins/beta-lactams.   LOS: 9 days   Melvenia Beam 04/21/2012, 8:04 AM

## 2012-04-21 NOTE — Progress Notes (Signed)
Pt seen/examined Left wrist dressing changed Drains removed. Left wrist looks much better, minimal swelling No ascending erythema or purulence Fingers less swollen  Plan: continue with splint and dressing Do not recommend repeat i/d today Will cont to follow Continue iv abx

## 2012-04-22 ENCOUNTER — Inpatient Hospital Stay (HOSPITAL_COMMUNITY): Payer: Medicaid Other

## 2012-04-22 LAB — BASIC METABOLIC PANEL
BUN: 7 mg/dL (ref 6–23)
CO2: 25 mEq/L (ref 19–32)
GFR calc non Af Amer: >90 mL/min (ref >90)
Glucose, Bld: 99 mg/dL (ref 70–99)
Potassium: 3.9 mEq/L (ref 3.5–5.1)
Sodium: 134 mEq/L — ABNORMAL LOW (ref 135–145)

## 2012-04-22 LAB — ANAEROBIC CULTURE

## 2012-04-22 LAB — CULTURE, BLOOD (ROUTINE X 2): Culture  Setup Time: 201304201054

## 2012-04-22 LAB — CBC
HCT: 28.8 % — ABNORMAL LOW (ref 39.0–52.0)
Hemoglobin: 9.5 g/dL — ABNORMAL LOW (ref 13.0–17.0)
MCH: 21.9 pg — ABNORMAL LOW (ref 26.0–34.0)
MCHC: 33 g/dL (ref 30.0–36.0)
MCV: 66.5 fL — ABNORMAL LOW (ref 78.0–100.0)
RBC: 4.33 MIL/uL (ref 4.22–5.81)

## 2012-04-22 LAB — IRON AND TIBC: Iron: <10 ug/dL — ABNORMAL LOW (ref 42–135)

## 2012-04-22 LAB — CULTURE, ROUTINE-ABSCESS

## 2012-04-22 MED ORDER — GADOBENATE DIMEGLUMINE 529 MG/ML IV SOLN
12.0000 mL | Freq: Once | INTRAVENOUS | Status: AC
  Administered 2012-04-22: 12 mL via INTRAVENOUS

## 2012-04-22 MED ORDER — SODIUM CHLORIDE 0.9 % IJ SOLN
10.0000 mL | INTRAMUSCULAR | Status: DC | PRN
Start: 2012-04-22 — End: 2012-05-07
  Administered 2012-04-23 – 2012-05-05 (×5): 10 mL

## 2012-04-22 NOTE — Op Note (Signed)
[name], [name]               ACCOUNT NO.:  [id number]  MEDICAL RECORD NO.:  [id number]  LOCATION:                                 FACILITY:  PHYSICIAN:  Madelynn Done, MD  DATE OF BIRTH:  12/13/1961  DATE OF PROCEDURE: DATE OF DISCHARGE:                              OPERATIVE REPORT   PREOPERATIVE DIAGNOSES: 1. Left wrist septic joint involving the radiocarpal joint and distal     radioulnar joint. 2. Left forearm deep abscess, wrist and forearm.  POSTOPERATIVE DIAGNOSES: 1. Left wrist septic joint involving the radiocarpal joint and distal     radioulnar joint. 2. Left forearm deep abscess, wrist and forearm.  ATTENDING PHYSICIAN:  Madelynn Done, MD, who scrubbed and present for the entire procedure.  ASSISTANT SURGEON:  None.  ANESTHESIA:  General via LMA.  SURGICAL PROCEDURES: 1. Drainage of left forearm deep abscess, deep forearm compartment. 2. Left wrist joint arthrotomy and exploration and drainage. 3. Left wrist distal radioulnar joint arthrotomy and joint exploration     and drainage.  SURGICAL INDICATIONS:  Mr. [name] is a right-hand-dominant gentleman, who was in the Internal Medicine Service with multiple areas of abscesses and infection.  The patient had imaging studies confirming the abscess in the left distal forearm with fluid in the joint concerning for a septic wrist joint.  The patient was seen and examined and recommended to undergo the above procedure.  Risks include but not limited to, bleeding, infection, damage to nearby nerves, arteries, or tendons, loss of motion of the wrist and digits, persistent infection, and need for further surgical intervention.  DESCRIPTION OF THE PROCEDURE:  The patient was appropriately identified in the preop holding area, a mark by permanent marker was made on the left wrist to indicate correct operative site.  He was then brought back to the operating room and placed supine on the anesthesia  table. General anesthesia was administered.  The patient tolerated this well. A well-padded tourniquet was then placed on the left brachium, sealed with 1000 drape.  Left upper extremity was prepped and draped in normal sterile fashion.  Time-out was called.  Correct site was identified, and procedure was then begun.  The limb was then elevated, and the tourniquet was inflated.  A volar approach to the distal radioulnar joint was then carried out going through the interval between the FDP and the ulnar artery and nerve.  Deep dissection was then carried out through the forearm fascia where the forearm abscess was then encountered in between the interval between the pronator quadratus and the flexor tendons.  Thorough irrigation of the abscess area was then carried out.  Once this was carried out, the distal radioulnar joint was then opened up volarly as well as the radiocarpal joint, and the patient did have gross infection within both the distal radioulnar joint and radiocarpal joint.  Arthrotomy was then carried out and thorough irrigation was then carried out of the wrist joint.  The wound was then irrigated with copious lactated Ringer saline irrigation greater than 3 L volarly.  Attention was then turned dorsally.  After this was done volarly, the wrist was then turned in  the dorsal position and a longitudinal incision was made in line with the long finger metacarpal. Dissection was carried out through the extensor tendon in the wrist joint and arthrotomy was then carried out dorsally.  Copious irrigation was then done both dorsally and volarly of the radiocarpal joint.  The patient did have moderate amount of fluid within the wrist joint, and this was copiously irrigated.  Synovectomy was then carried out within the wrist.  After thorough wound irrigation, both the wounds were then closed, loosely reapproximated, and closed over drains, large Penrose drain volarly and small vessel  loop dorsally.  Skin staples dorsally just to reapproximate the wound, and the wound was reapproximated volarly with simple nylon sutures.  Adaptic dressing, sterile compressive bandage were applied.  The patient tolerated the procedure well and was placed in a short-arm splint, extubated, and taken to recovery room in good condition.  POSTOPERATIVE PLAN:  The patient will be admitted, continued on the IV antibiotics and IV meds in the Medicine Service.  I plan to see him back each day.  We will examine the wound within 24-48 hours.  The patient is likely going to require repeat I and D, and we will likely schedule that in the coming days.     Madelynn Done, MD     FWO/MEDQ  D:  04/18/2012  T:  04/19/2012  Job:  [id number]

## 2012-04-22 NOTE — Progress Notes (Signed)
Subjective: Increased drainage from neck wound   Antibiotics:  Anti-infectives     Start     Dose/Rate Route Frequency Ordered Stop   04/17/12 1600   clindamycin (CLEOCIN) IVPB 900 mg  Status:  Discontinued        900 mg 100 mL/hr over 30 Minutes Intravenous Every 8 hours 04/17/12 1454 04/21/12 1249   04/17/12 1600  Ampicillin-Sulbactam (UNASYN) 3 g in sodium chloride 0.9 % 100 mL IVPB       3 g 100 mL/hr over 60 Minutes Intravenous Every 6 hours 04/17/12 1455     04/14/12 1400   ceFAZolin (ANCEF) IVPB 1 g/50 mL premix  Status:  Discontinued        1 g 100 mL/hr over 30 Minutes Intravenous 3 times per day 04/14/12 1057 04/17/12 1426   04/13/12 1900   vancomycin (VANCOCIN) 500 mg in sodium chloride 0.9 % 100 mL IVPB  Status:  Discontinued        500 mg 100 mL/hr over 60 Minutes Intravenous Every 12 hours 04/13/12 1601 04/14/12 1058   04/13/12 0815   moxifloxacin (AVELOX) IVPB 400 mg        400 mg 250 mL/hr over 60 Minutes Intravenous  Once 04/13/12 0805 04/13/12 0953   04/13/12 0500   vancomycin (VANCOCIN) IVPB 1000 mg/200 mL premix  Status:  Discontinued        1,000 mg 200 mL/hr over 60 Minutes Intravenous Every 12 hours 04/13/12 0453 04/13/12 1414   04/13/12 0445   vancomycin (VANCOCIN) IVPB 1000 mg/200 mL premix        1,000 mg 200 mL/hr over 60 Minutes Intravenous  Once 04/13/12 0442 04/13/12 0607          Medications: Scheduled Meds:    . ampicillin-sulbactam (UNASYN) IV  3 g Intravenous Q6H  . enoxaparin (LOVENOX) injection  40 mg Subcutaneous Q24H  . gadobenate dimeglumine  12 mL Intravenous Once  . mupirocin ointment   Topical BID  . nystatin  10 mL Oral QID  . sodium chloride  3 mL Intravenous Q12H  . DISCONTD: morphine   Intravenous Q4H  . DISCONTD: potassium chloride  40 mEq Oral Daily   Continuous Infusions:   PRN Meds:.ketorolac, morphine injection, ondansetron (ZOFRAN) IV, ondansetron, phenol, DISCONTD: diphenhydrAMINE, DISCONTD: diphenhydrAMINE,  DISCONTD: naloxone, DISCONTD: sodium chloride   Objective: Weight change:   Intake/Output Summary (Last 24 hours) at 04/22/12 1404 Last data filed at 04/22/12 1300  Gross per 24 hour  Intake    480 ml  Output   2375 ml  Net  -1895 ml   Blood pressure 131/89, pulse 100, temperature 100 F (37.8 C), temperature source Oral, resp. rate 18, height 5\' 4"  (1.626 m), weight 134 lb 7.7 oz (61 kg), SpO2 96.00%. Temp:  [98.7 F (37.1 C)-100 F (37.8 C)] 100 F (37.8 C) (04/29 1402) Pulse Rate:  [89-103] 100  (04/29 1402) Resp:  [18-22] 18  (04/29 1402) BP: (126-146)/(78-91) 131/89 mmHg (04/29 1402) SpO2:  [94 %-98 %] 96 % (04/29 1402)  Physical Exam: General: Alert and awake, oriented x3, not in any acute distress. HEENT: anicteric sclera, pupils reactive to light and accommodation, EOMI CVS regular rate, normal r,  no murmur rubs or gallops Chest: clear to auscultation bilaterally, no wheezing, rales or rhonchi Abdomen: soft nontender, nondistended, normal bowel sounds, Skin/soft tissue neck is copious drainage from woundleft arm with dressing, right ankle joint is swollen and tender, neck surgical site clean Right thigh with less tenderness,  right ankle and foot with tenderness Neuro: nonfocal, strength and sensation intact    Neuro: nonfocal  Lab Results:  Basename 04/22/12 0522 04/21/12 1025  WBC 12.7* 11.9*  HGB 9.5* 9.1*  HCT 28.8* 28.3*  PLT 564* 430*    BMET  Basename 04/22/12 0522 04/21/12 1400 04/21/12 1025  NA 134* -- 130*  K 3.9 4.2 --  CL 100 -- 100  CO2 25 -- 21  GLUCOSE 99 -- 106*  BUN 7 -- 10  CREATININE 0.64 -- 0.54  CALCIUM 8.5 -- 7.8*    Micro Results: Recent Results (from the past 240 hour(s))  CULTURE, BLOOD (ROUTINE X 2)     Status: Normal   Collection Time   04/13/12  1:28 AM      Component Value Range Status Comment   Specimen Description BLOOD RIGHT HAND   Final    Special Requests BOTTLES DRAWN AEROBIC AND ANAEROBIC [facility] EACH   Final      Culture  Setup Time [id number]   Final    Culture     Final    Value: GROUP A STREP (S.PYOGENES) ISOLATED     Note: CRITICAL RESULT CALLED TO, READ BACK BY AND VERIFIED WITH: RN D. CELANO ON 04/14/12 AT 1050 BY DTERRY FAXED TO BETTY ROGERS AT [facility] BY LYONK     Note: Gram Stain Report Called to,Read Back By and Verified With: RN M. TROGDEN ON 04/13/12 AT 1930 BY DTERRY   Report Status 04/22/2012 FINAL   Final   CULTURE, BLOOD (ROUTINE X 2)     Status: Normal   Collection Time   04/13/12  1:34 AM      Component Value Range Status Comment   Specimen Description BLOOD RIGHT ARM   Final    Special Requests BOTTLES DRAWN AEROBIC AND ANAEROBIC 10CC EACH   Final    Culture  Setup Time [id number]   Final    Culture     Final    Value: GROUP A STREP (S.PYOGENES) ISOLATED     Note: CRITICAL RESULT CALLED TO, READ BACK BY AND VERIFIED WITH: RN D. CELANO ON 04/14/12 AT 1050 BY DTERRY FAXED TO BETTY ROGERS AT [facility] BY LYONK     Note: Gram Stain Report Called to,Read Back By and Verified With: RN M. TROGDEN ON 04/13/12 AT 1930 BY DTERRY   Report Status 04/22/2012 FINAL   Final   CULTURE, BLOOD (ROUTINE X 2)     Status: Normal (Preliminary result)   Collection Time   04/17/12  4:00 PM      Component Value Range Status Comment   Specimen Description BLOOD RIGHT HAND   Final    Special Requests BOTTLES DRAWN AEROBIC AND ANAEROBIC 10CC   Final    Culture  Setup Time [id number]   Final    Culture     Final    Value:        BLOOD CULTURE RECEIVED NO GROWTH TO DATE CULTURE WILL BE HELD FOR 5 DAYS BEFORE ISSUING A FINAL NEGATIVE REPORT   Report Status PENDING   Incomplete   CULTURE, BLOOD (ROUTINE X 2)     Status: Normal (Preliminary result)   Collection Time   04/17/12  4:05 PM      Component Value Range Status Comment   Specimen Description BLOOD RIGHT HAND   Final    Special Requests BOTTLES DRAWN AEROBIC AND ANAEROBIC 10CC   Final    Culture  Setup Time [id number]   Final  Culture     Final     Value:        BLOOD CULTURE RECEIVED NO GROWTH TO DATE CULTURE WILL BE HELD FOR 5 DAYS BEFORE ISSUING A FINAL NEGATIVE REPORT   Report Status PENDING   Incomplete   SURGICAL PCR SCREEN     Status: Normal   Collection Time   04/17/12  9:32 PM      Component Value Range Status Comment   MRSA, PCR NEGATIVE  NEGATIVE  Final    Staphylococcus aureus NEGATIVE  NEGATIVE  Final   GRAM STAIN     Status: Normal   Collection Time   04/17/12 11:48 PM      Component Value Range Status Comment   Specimen Description ABSCESS NECK RIGHT   Final    Special Requests PATIENT ON FOLLOWING UNASYN   Final    Gram Stain     Final    Value: FEW WBC PRESENT, PREDOMINANTLY PMN CORRECTED ON 04/25 AT 1554: PREVIOUSLY REPORTED AS NO WBC SEEN     RARE GRAM POSITIVE COCCI IN PAIRS IN CHAINS CORRECTED ON 04/25 AT 1555: PREVIOUSLY REPORTED AS NO ORGANISMS SEEN     CALLED [facility] Mendel Corning RN 04/18/12 0040 ACAINJ     REPORT CORRECTED AND CALLED TO BETH BRADT, RN 04/18/12 1555 BY K SCHULTZ   Report Status 04/18/2012 FINAL   Final   ANAEROBIC CULTURE     Status: Normal   Collection Time   04/17/12 11:48 PM      Component Value Range Status Comment   Specimen Description ABSCESS NECK RIGHT   Final    Special Requests PATIENT ON FOLLOWING UNASYN   Final    Gram Stain     Final    Value: FEW WBC PRESENT, PREDOMINANTLY PMN     RARE GRAM POSITIVE COCCI     IN PAIRS IN CHAINS   Culture NO ANAEROBES ISOLATED   Final    Report Status 04/22/2012 FINAL   Final   CULTURE, ROUTINE-ABSCESS     Status: Normal   Collection Time   04/17/12 11:48 PM      Component Value Range Status Comment   Specimen Description ABSCESS NECK RIGHT   Final    Special Requests PATIENT ON FOLLOWING UNASYN   Final    Gram Stain     Final    Value: FEW WBC PRESENT, PREDOMINANTLY PMN     RARE GRAM POSITIVE COCCI     IN PAIRS IN CHAINS Performed at [facility] Gram Stain Report Called to,Read Back By and Verified With: Gram Stain Report Called to,Read  Back By and Verified With: [facility] JOHNSON J RN 04/18/12 0040 ACAINJ Gram Stain previously reported as No WBC seen      and No Organisms seen CORRECTED RESULTS CALLED TO: KIM S AT [facility] MICRO [id number] 1300 BY WOODB   Culture RARE GROUP A STREP (S.PYOGENES) ISOLATED   Final    Report Status 04/20/2012 FINAL   Final   CULTURE, ROUTINE-ABSCESS     Status: Normal   Collection Time   04/18/12  9:19 PM      Component Value Range Status Comment   Specimen Description ABSCESS GROIN LEFT   Final    Special Requests PSOAS ASPIRATION   Final    Gram Stain     Final    Value: MODERATE WBC PRESENT,BOTH PMN AND MONONUCLEAR     NO SQUAMOUS EPITHELIAL CELLS SEEN     RARE GRAM POSITIVE  COCCI IN PAIRS   Culture NO GROWTH 3 DAYS   Final    Report Status 04/22/2012 FINAL   Final     Studies/Results: Mr Foot Right W Wo Contrast  04/22/2012  *RADIOLOGY REPORT*  Clinical Data: Right ankle pain and swelling.  History of sepsis.  MRI OF THE RIGHT FOREFOOT WITHOUT AND WITH CONTRAST  Technique:  Multiplanar, multisequence MR imaging was performed both before and after administration of intravenous contrast.  Contrast: 12mL MULTIHANCE GADOBENATE DIMEGLUMINE 529 MG/ML IV SOLN  Comparison: None  Findings: There is diffuse subcutaneous soft tissue swelling/edema most notable along the dorsum of the foot.  There is also edema like signal abnormality involving the foot musculature and fluid in the fascial planes.  The findings suggest cellulitis and myofasciitis.  No discrete drainable soft tissue abscess.  No findings for septic arthritis or osteomyelitis.  The major ankle and foot tendons and ligaments are intact.  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  IMPRESSION:  1.  Cellulitis and myofasciitis without findings for septic arthritis, osteomyelitis or focal soft tissue abscess. 2.  Plantar fibroma.  Original Report Authenticated By: P. Loralie Champagne, M.D.       Assessment/Plan: [name] is a 51 y.o. male with:   1)SEVERE GROUP A STREPTOCOCCAL INFECTION WITH BACTEREMIA AND MX METASATIC FOCI OF INFECTION  A)NEck abscess, mediastinal abscess with septic [place] joint:  --he is sp surgery by Dr. Lazarus Salines and hopefully he was able to drain all of the fluid out from the mediastinum as well:  --worry about the [facility] joint --will need protracted therapy and repeat imaging along the way --I agree that we may need to reimage his neck and chest soon  B) Hand infection: Greatly appreciate Ortho hand surgery here  C) Thigh : appreciate orthopedics and IR help --will ikley need repeat imaging down the road   D) Right ankle: MRI shows fascitis, myositis but no abscess. --follow clnically   E) Bacteremia: --ensure repeat blood cutlures clearing  --CONTINUE UNASYN and will DC clindamycin will likely change to rocephin once daily dosing for convenience at DC -    LOS: 10 days   Acey Lav 04/22/2012, 2:04 PM

## 2012-04-22 NOTE — Progress Notes (Signed)
Peripherally Inserted Central Catheter/Midline Placement  The IV Nurse has discussed with the patient and/or persons authorized to consent for the patient, the purpose of this procedure and the potential benefits and risks involved with this procedure.  The benefits include less needle sticks, lab draws from the catheter and patient may be discharged home with the catheter.  Risks include, but not limited to, infection, bleeding, blood clot (thrombus formation), and puncture of an artery; nerve damage and irregular heat beat.  Alternatives to this procedure were also discussed.  PICC/Midline Placement Documentation  PICC / Midline Single Lumen 04/22/12 PICC Right Brachial (Active)  Site Assessment Clean;Dry;Intact 04/22/2012  6:00 PM  Line Status Infusing 04/22/2012  6:00 PM  Dressing Type Transparent 04/22/2012  6:00 PM  Dressing Status Clean;Dry;Intact 04/22/2012  6:00 PM  Line Care Tubing changed 04/22/2012  6:00 PM  Indication for Insertion or Continuance of Line Home intravenous therapies (PICC only) 04/22/2012  6:00 PM       Lisabeth Devoid 04/22/2012, 7:30 PM

## 2012-04-22 NOTE — Progress Notes (Signed)
Nutrition Follow-up  Diet Order:  Dysphagia 3, thin  Pt states his appetite has improved and returned to normal, however he is self-limiting intake due to dislike of current diet order.  Pt prefers fresh fruit and vegetables, however is unable to order them due to dysphagia diet.  Discussed with RN who will page MD to liberalize diet.  Pt states no difficulty chewing or swallowing.  Meds: Scheduled Meds:   . ampicillin-sulbactam (UNASYN) IV  3 g Intravenous Q6H  . enoxaparin (LOVENOX) injection  40 mg Subcutaneous Q24H  . gadobenate dimeglumine  12 mL Intravenous Once  . mupirocin ointment   Topical BID  . nystatin  10 mL Oral QID  . sodium chloride  3 mL Intravenous Q12H  . DISCONTD: morphine   Intravenous Q4H   Continuous Infusions:  PRN Meds:.ketorolac, morphine injection, ondansetron (ZOFRAN) IV, ondansetron, phenol, DISCONTD: diphenhydrAMINE, DISCONTD: diphenhydrAMINE, DISCONTD: naloxone, DISCONTD: sodium chloride  Labs:  CMP     Component Value Date/Time   NA 134* 04/22/2012 0522   K 3.9 04/22/2012 0522   CL 100 04/22/2012 0522   CO2 25 04/22/2012 0522   GLUCOSE 99 04/22/2012 0522   BUN 7 04/22/2012 0522   CREATININE 0.64 04/22/2012 0522   CALCIUM 8.5 04/22/2012 0522   PROT 6.7 04/18/2012 1012   ALBUMIN 1.4* 04/18/2012 1012   AST 77* 04/18/2012 1012   ALT 33 04/18/2012 1012   ALKPHOS 149* 04/18/2012 1012   BILITOT 1.2 04/18/2012 1012   GFRNONAA >90 04/22/2012 0522   GFRAA >90 04/22/2012 0522     Intake/Output Summary (Last 24 hours) at 04/22/12 1603 Last data filed at 04/22/12 1300  Gross per 24 hour  Intake    480 ml  Output   1725 ml  Net  -1245 ml    Weight Status:  Stable  Nutrition Dx:  Increased nutrient needs, ongoing  Intervention:   1.  Modify diet; Recommend liberalizing to Regular 2.  Meals/snacks; discussed food preferences and encouraged pt to order snacks if he is still hungry after meals.    Monitor:   1.  Food/Beverage; pt to increase intake to >50%  of meals, and request snacks if needed.   Hoyt Koch Pager #:  [phone]

## 2012-04-22 NOTE — Progress Notes (Signed)
04/22/2012 11:30 AM  Cuthbert, Demetrice [id number]  Post-Op Day 5    Temp:  [98.7 F (37.1 C)-99.3 F (37.4 C)] 99.2 F (37.3 C) (04/29 1012) Pulse Rate:  [89-103] 99  (04/29 1012) Resp:  [17-22] 18  (04/29 1012) BP: (126-146)/(78-91) 146/78 mmHg (04/29 1012) SpO2:  [94 %-98 %] 94 % (04/29 1012),     Intake/Output Summary (Last 24 hours) at 04/22/12 1130 Last data filed at 04/22/12 0747  Gross per 24 hour  Intake      0 ml  Output   2825 ml  Net  -2825 ml    Results for orders placed during the hospital encounter of 04/12/12 (from the past 24 hour(s))  POTASSIUM     Status: Normal   Collection Time   04/21/12  2:00 PM      Component Value Range   Potassium 4.2  3.5 - 5.1 (mEq/L)  BASIC METABOLIC PANEL     Status: Abnormal   Collection Time   04/22/12  5:22 AM      Component Value Range   Sodium 134 (*) 135 - 145 (mEq/L)   Potassium 3.9  3.5 - 5.1 (mEq/L)   Chloride 100  96 - 112 (mEq/L)   CO2 25  19 - 32 (mEq/L)   Glucose, Bld 99  70 - 99 (mg/dL)   BUN 7  6 - 23 (mg/dL)   Creatinine, Ser 4.09  0.50 - 1.35 (mg/dL)   Calcium 8.5  8.4 - 81.1 (mg/dL)   GFR calc non Af Amer >90  >90 (mL/min)   GFR calc Af Amer >90  >90 (mL/min)  CBC     Status: Abnormal   Collection Time   04/22/12  5:22 AM      Component Value Range   WBC 12.7 (*) 4.0 - 10.5 (K/uL)   RBC 4.33  4.22 - 5.81 (MIL/uL)   Hemoglobin 9.5 (*) 13.0 - 17.0 (g/dL)   HCT 91.4 (*) 78.2 - 52.0 (%)   MCV 66.5 (*) 78.0 - 100.0 (fL)   MCH 21.9 (*) 26.0 - 34.0 (pg)   MCHC 33.0  30.0 - 36.0 (g/dL)   RDW 95.6  21.3 - 08.6 (%)   Platelets 564 (*) 150 - 400 (K/uL)    SUBJECTIVE:  Pain better in neck, ankle, groin. LEFT hand still mod tender.  OBJECTIVE:  Mild-mod purulent drainage RIGHT neck wound.  No fluctuance.  No erythema/edema.  Voice strong and clear.  IMPRESSION:  Satisfactory check.  Still elevated WBC.  PLAN:  Follow daily WBC.  May need repeat Neck CT with contrast if progress faltering, which may be  difficult to interpret due to prior surgery.    Flo Shanks

## 2012-04-22 NOTE — Progress Notes (Signed)
Utilization Review Completed.[name] T4/29/2013   

## 2012-04-22 NOTE — Progress Notes (Deleted)
Peripherally Inserted Central Catheter/Midline Placement  The IV Nurse has discussed with the patient and/or persons authorized to consent for the patient, the purpose of this procedure and the potential benefits and risks involved with this procedure.  The benefits include less needle sticks, lab draws from the catheter and patient may be discharged home with the catheter.  Risks include, but not limited to, infection, bleeding, blood clot (thrombus formation), and puncture of an artery; nerve damage and irregular heat beat.  Alternatives to this procedure were also discussed.  PICC/Midline Placement Documentation  PICC / Midline Single Lumen 04/22/12 PICC Right Brachial (Active)  Site Assessment Clean;Dry;Intact 04/22/2012  6:00 PM  Line Status Infusing 04/22/2012  6:00 PM  Dressing Type Transparent 04/22/2012  6:00 PM  Dressing Status Clean;Dry;Intact 04/22/2012  6:00 PM  Line Care Tubing changed 04/22/2012  6:00 PM  Indication for Insertion or Continuance of Line Home intravenous therapies (PICC only) 04/22/2012  6:00 PM       Ronie Spies [facility] 04/22/2012, 7:19 PM       Ronie Spies [facility] 04/22/2012, 7:17 PM

## 2012-04-22 NOTE — Progress Notes (Signed)
Occupational Therapy Treatment Patient Details Name: [name] MRN: [id number] DOB: 11/22/1961 Today's Date: 04/22/2012 Time: 6213-0865 OT Time Calculation (min): 41 min  OT Assessment / Plan / Recommendation Comments on Treatment Session Pt doing fairly well with moving around giving weakness in LLE, and swollen RLE. Really no improvement with AROM of LUE    Follow Up Recommendations  Home health OT    Equipment Recommendations  3 in 1 bedside comode    Frequency Min 3X/week   Plan Discharge plan needs to be updated    Precautions / Restrictions Restrictions Weight Bearing Restrictions: Yes LUE Weight Bearing:  (No WBing through left hand)        ADL  Lower Body Dressing: Moderate assistance (could only don/doff right sock, not left) Where Assessed - Lower Body Dressing: Sitting, chair;Supported Statistician: IT sales professional Method: Proofreader: Regular height toilet Toileting - Clothing Manipulation: Minimal assistance;Performed Glass blower/designer) Where Assessed - Glass blower/designer Manipulation: Standing Toileting - Hygiene: Set up;Performed Where Assessed - Toileting Hygiene: Sit on 3-in-1 or toilet Tub/Shower Transfer: Not assessed Tub/Shower Transfer Method: Not assessed Equipment Used: Rolling walker Ambulation Related to ADLs: Pt min guard A with RW    OT Goals ADL Goals ADL Goal: Lower Body Dressing - Progress: Progressing toward goals ADL Goal: Toilet Transfer - Progress: Progressing toward goals ADL Goal: Toileting - Clothing Manipulation - Progress: Met ADL Goal: Toileting - Hygiene - Progress: Progressing toward goals Arm Goals Arm Goal: PROM - Progress: Progressing toward goal Arm Goal: Additional Goal #1 - Progress: Progressing toward goals  Visit Information  Last OT Received On: 04/22/12 Assistance Needed: +1    Subjective Data  Subjective: This works well for me to go to the bathroom (RW;  however either needs a SPC or a PFRW, made RN aware that pt needs a PT consult)   Prior Functioning       Cognition  Overall Cognitive Status: Appears within functional limits for tasks assessed/performed Arousal/Alertness: Awake/alert Orientation Level: Appears intact for tasks assessed Behavior During Session: [facility] for tasks performed    Mobility Bed Mobility Bed Mobility: Supine to Sit;Sitting - Scoot to [place] of Bed;Sit to Supine;Scooting to [facility] Supine to Sit: 6: Modified independent (Device/Increase time);HOB flat Sitting - Scoot to Edge of Bed: 6: Modified independent (Device/Increase time) Sit to Supine: 6: Modified independent (Device/Increase time);HOB flat Scooting to HOB: 6: Modified independent (Device/Increase time) Details for Bed Mobility Assistance: All bed mobility takes increased time, pt using left elbow to A with moving around in bed Transfers Transfers: Sit to Stand;Stand to Sit Sit to Stand: 5: Supervision;With upper extremity assist;From bed (cues to push up with good arm) Stand to Sit: 5: Supervision;With upper extremity assist;With armrests;To chair/3-in-1 (VCs to reach back with good arm for arm of chair)   Exercises Other Exercises Other Exercises: Pt performed 10 reps of SROM composite digit flex/ext, AROM elbow flex/ext, and AROM supination/pronation. Encouraged him to do these 4 more times today with 10 reps each. Pt remains with limited AROM and PROM digits (1-3 worse then 4 and 5) due to pain),  -15 degrees  elbow extenison, and  has about 15 degrees of supination from full pronation.  Balance    End of Session OT - End of Session Equipment Utilized During Treatment:  (RW) Activity Tolerance: Patient tolerated treatment well Patient left: in bed;with call bell/phone within reach;with family/visitor present (wife and son)   Evette Georges [phone] 04/22/2012, 1:01 PM

## 2012-04-22 NOTE — Progress Notes (Signed)
Subjective: No new issues. Good appetite, moving bowels, not in acute pain.  Objective: Vital signs in last 24 hours: Temp:  [98.7 F (37.1 C)-99.3 F (37.4 C)] 98.8 F (37.1 C) (04/29 0505) Pulse Rate:  [89-103] 101  (04/29 0505) Resp:  [17-22] 18  (04/29 0505) BP: (126-143)/(82-91) 141/86 mmHg (04/29 0505) SpO2:  [95 %-98 %] 95 % (04/29 0505) Weight change:  Last BM Date:  (4/27)  Intake/Output from previous day: 04/28 0701 - 04/29 0700 In: -  Out: 2975 [Urine:2975] Total I/O In: -  Out: 200 [Urine:200]   Physical Exam: General: Comfortable, alert, communicative, fully oriented, not short of breath at rest.  HEENT:  Mild clinical pallor, no jaundice, no conjunctival injection or discharge. Hydration appears fair. NECK:  Supple, has dressings, which are stained with purulent drainage. CHEST:  Clinically clear to auscultation, no wheezes, no crackles. HEART:  Sounds 1 and 2 heard, normal, regular, no murmurs. ABDOMEN:  Full, soft, non-tender, no palpable organomegaly, no palpable masses, normal bowel sounds. GENITALIA:  Not examined. UPPER EXTREMITIES: Left hand and forearm are heavily bandaged.  LOWER EXTREMITIES:  No pitting edema, palpable peripheral pulses. Dorsum of right foot is swollen, but right ankle is non-tender, and has full ROM. MUSCULOSKELETAL SYSTEM:  Generalized osteoarthritic changes, otherwise, normal. CENTRAL NERVOUS SYSTEM:  No focal neurologic deficit on gross examination.  Lab Results:  Basename 04/22/12 0522 04/21/12 1025  WBC 12.7* 11.9*  HGB 9.5* 9.1*  HCT 28.8* 28.3*  PLT 564* 430*    Basename 04/22/12 0522 04/21/12 1400 04/21/12 1025  NA 134* -- 130*  K 3.9 4.2 --  CL 100 -- 100  CO2 25 -- 21  GLUCOSE 99 -- 106*  BUN 7 -- 10  CREATININE 0.64 -- 0.54  CALCIUM 8.5 -- 7.8*   Recent Results (from the past 240 hour(s))  CULTURE, BLOOD (ROUTINE X 2)     Status: Normal   Collection Time   04/13/12  1:28 AM      Component Value Range  Status Comment   Specimen Description BLOOD RIGHT HAND   Final    Special Requests BOTTLES DRAWN AEROBIC AND ANAEROBIC [facility] EACH   Final    Culture  Setup Time [id number]   Final    Culture     Final    Value: GROUP A STREP (S.PYOGENES) ISOLATED     Note: CRITICAL RESULT CALLED TO, READ BACK BY AND VERIFIED WITH: RN D. CELANO ON 04/14/12 AT 1050 BY DTERRY FAXED TO BETTY ROGERS AT [facility] BY LYONK     Note: Gram Stain Report Called to,Read Back By and Verified With: RN M. TROGDEN ON 04/13/12 AT 1930 BY DTERRY   Report Status 04/22/2012 FINAL   Final   CULTURE, BLOOD (ROUTINE X 2)     Status: Normal   Collection Time   04/13/12  1:34 AM      Component Value Range Status Comment   Specimen Description BLOOD RIGHT ARM   Final    Special Requests BOTTLES DRAWN AEROBIC AND ANAEROBIC 10CC EACH   Final    Culture  Setup Time [id number]   Final    Culture     Final    Value: GROUP A STREP (S.PYOGENES) ISOLATED     Note: CRITICAL RESULT CALLED TO, READ BACK BY AND VERIFIED WITH: RN D. CELANO ON 04/14/12 AT 1050 BY DTERRY FAXED TO BETTY ROGERS AT [facility] BY LYONK     Note: Gram Stain Report Called to,Read Back By and  Verified With: RN M. TROGDEN ON 04/13/12 AT 1930 BY DTERRY   Report Status 04/22/2012 FINAL   Final   CULTURE, BLOOD (ROUTINE X 2)     Status: Normal (Preliminary result)   Collection Time   04/17/12  4:00 PM      Component Value Range Status Comment   Specimen Description BLOOD RIGHT HAND   Final    Special Requests BOTTLES DRAWN AEROBIC AND ANAEROBIC 10CC   Final    Culture  Setup Time [id number]   Final    Culture     Final    Value:        BLOOD CULTURE RECEIVED NO GROWTH TO DATE CULTURE WILL BE HELD FOR 5 DAYS BEFORE ISSUING A FINAL NEGATIVE REPORT   Report Status PENDING   Incomplete   CULTURE, BLOOD (ROUTINE X 2)     Status: Normal (Preliminary result)   Collection Time   04/17/12  4:05 PM      Component Value Range Status Comment   Specimen Description BLOOD RIGHT HAND   Final      Special Requests BOTTLES DRAWN AEROBIC AND ANAEROBIC 10CC   Final    Culture  Setup Time [id number]   Final    Culture     Final    Value:        BLOOD CULTURE RECEIVED NO GROWTH TO DATE CULTURE WILL BE HELD FOR 5 DAYS BEFORE ISSUING A FINAL NEGATIVE REPORT   Report Status PENDING   Incomplete   SURGICAL PCR SCREEN     Status: Normal   Collection Time   04/17/12  9:32 PM      Component Value Range Status Comment   MRSA, PCR NEGATIVE  NEGATIVE  Final    Staphylococcus aureus NEGATIVE  NEGATIVE  Final   GRAM STAIN     Status: Normal   Collection Time   04/17/12 11:48 PM      Component Value Range Status Comment   Specimen Description ABSCESS NECK RIGHT   Final    Special Requests PATIENT ON FOLLOWING UNASYN   Final    Gram Stain     Final    Value: FEW WBC PRESENT, PREDOMINANTLY PMN CORRECTED ON 04/25 AT 1554: PREVIOUSLY REPORTED AS NO WBC SEEN     RARE GRAM POSITIVE COCCI IN PAIRS IN CHAINS CORRECTED ON 04/25 AT 1555: PREVIOUSLY REPORTED AS NO ORGANISMS SEEN     CALLED [facility] Mendel Corning RN 04/18/12 0040 ACAINJ     REPORT CORRECTED AND CALLED TO BETH BRADT, RN 04/18/12 1555 BY K SCHULTZ   Report Status 04/18/2012 FINAL   Final   ANAEROBIC CULTURE     Status: Normal (Preliminary result)   Collection Time   04/17/12 11:48 PM      Component Value Range Status Comment   Specimen Description ABSCESS NECK RIGHT   Final    Special Requests PATIENT ON FOLLOWING UNASYN   Final    Gram Stain PENDING   Incomplete    Culture     Final    Value: NO ANAEROBES ISOLATED; CULTURE IN PROGRESS FOR 5 DAYS   Report Status PENDING   Incomplete   CULTURE, ROUTINE-ABSCESS     Status: Normal   Collection Time   04/17/12 11:48 PM      Component Value Range Status Comment   Specimen Description ABSCESS NECK RIGHT   Final    Special Requests PATIENT ON FOLLOWING UNASYN   Final    Gram Stain  Final    Value: FEW WBC PRESENT, PREDOMINANTLY PMN     RARE GRAM POSITIVE COCCI     IN PAIRS IN CHAINS Performed at  [facility] Gram Stain Report Called to,Read Back By and Verified With: Gram Stain Report Called to,Read Back By and Verified With: [facility] JOHNSON J RN 04/18/12 0040 ACAINJ Gram Stain previously reported as No WBC seen      and No Organisms seen CORRECTED RESULTS CALLED TO: KIM S AT [facility] MICRO [id number] BY WOODB   Culture RARE GROUP A STREP (S.PYOGENES) ISOLATED   Final    Report Status 04/20/2012 FINAL   Final   CULTURE, ROUTINE-ABSCESS     Status: Normal (Preliminary result)   Collection Time   04/18/12  9:19 PM      Component Value Range Status Comment   Specimen Description ABSCESS GROIN LEFT   Final    Special Requests PSOAS ASPIRATION   Final    Gram Stain     Final    Value: MODERATE WBC PRESENT,BOTH PMN AND MONONUCLEAR     NO SQUAMOUS EPITHELIAL CELLS SEEN     RARE GRAM POSITIVE COCCI IN PAIRS   Culture NO GROWTH 2 DAYS   Final    Report Status PENDING   Incomplete      Studies/Results: Mr Foot Right W Wo Contrast  04/22/2012  *RADIOLOGY REPORT*  Clinical Data: Right ankle pain and swelling.  History of sepsis.  MRI OF THE RIGHT FOREFOOT WITHOUT AND WITH CONTRAST  Technique:  Multiplanar, multisequence MR imaging was performed both before and after administration of intravenous contrast.  Contrast: 12mL MULTIHANCE GADOBENATE DIMEGLUMINE 529 MG/ML IV SOLN  Comparison: None  Findings: There is diffuse subcutaneous soft tissue swelling/edema most notable along the dorsum of the foot.  There is also edema like signal abnormality involving the foot musculature and fluid in the fascial planes.  The findings suggest cellulitis and myofasciitis.  No discrete drainable soft tissue abscess.  No findings for septic arthritis or osteomyelitis.  The major ankle and foot tendons and ligaments are intact.  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  IMPRESSION:  1.  Cellulitis and myofasciitis without findings for septic arthritis, osteomyelitis or  focal soft tissue abscess. 2.  Plantar fibroma.  Original Report Authenticated By: P. Loralie Champagne, M.D.    Medications: Scheduled Meds:   . ampicillin-sulbactam (UNASYN) IV  3 g Intravenous Q6H  . enoxaparin (LOVENOX) injection  40 mg Subcutaneous Q24H  . gadobenate dimeglumine  12 mL Intravenous Once  . mupirocin ointment   Topical BID  . nystatin  10 mL Oral QID  . sodium chloride  3 mL Intravenous Q12H  . DISCONTD: clindamycin (CLEOCIN) IV  900 mg Intravenous Q8H  . DISCONTD: morphine   Intravenous Q4H  . DISCONTD: potassium chloride  40 mEq Oral Daily   Continuous Infusions:  PRN Meds:.ketorolac, morphine injection, ondansetron (ZOFRAN) IV, ondansetron, phenol, DISCONTD: diphenhydrAMINE, DISCONTD: diphenhydrAMINE, DISCONTD: naloxone, DISCONTD: sodium chloride  Assessment/Plan:  Active Problems:  1. Bacteremia due to Group A Streptococcus/ Sepsis: Patient had an upper respiratory infection on April 13 for which he sought medical care but did not receive antibiotics and it is presumed he was treated for a viral upper respiratory infection. He failed to improve, subsequently developed more constitutional symptoms and presented to our hospital with current condition. Septic work up revealed Group A streptococcal bacteremia, with multiple metastatic infective foci. ID consultation was  provided by Dr Acey Lav, who was very helpful in rationalizing antibiotic therapy. Patient is currently on Unasyn, but will be transitioned to Rocephin monotherapy on discharge. 2D Echocardiogram of 04/18/12, showed normal left ventricular cavity size, mild concentric hypertrophy and ejection fraction of 60% to 65%, with no regional wall motion abnormalities. A small to moderate pericardial effusion was identified circumferential to the heart and no vegetations were identified. Dr Dietrich Pates, [place] cardiologist, has  reviewed the echocardiogram. In her opinion the effusion is actually small and no  tamponade physiology is seen.  2. Multiple septic metastatic foci/abscesses and cellulitis: Patient presented with a swollen left hand and arm, as we;ll as generalized pains. Evaluation, revealed multiple foci of infection, including neck abscess with extension into right chest wall/Sterno-clavicular joint and mediastinum, cellulitis and abscess of Left hand/Left Psoas abscess/ Right ankle and thigh swelling with pain and erythema- ? Evolving abscess. ENT consult was provided by Dr Flo Shanks, who performed darinage of neck/mediastinal abscesses, Dr Bradly Bienenstock carried out debridement of LUE and aspiration of right ankle on 04/18/12. Aspiration of left  iliopsoas abscess was performed by Dr Oley Balm,  interventional radiologist, on the same date. Patient had a transaminitis, likely due to sepsis, but this is improving with treatment. 3. Acute respiratory failure with hypoxia/ Pleural effusion, bilateral with compressive atelectasis:  Patient had transient hypoxia, soon after hospitalization and procedures. This was felt multifactorial, secondary to pleural effusions and compressive atelectasis,as well a s oioid analgesics. He was deemed at risk for developing HCAP, but recovered with incentive spirometry and oxygen supplementation. 4. Thrush: An incidental finding. Improving with Nystatin swish and swallow.  5. Anemia:   Patient had a mild microcytic anemia, with Hemoglobin of 11.2 and MCV of 67.1 at presentation. During this hospitalization, hemoglobin has, possibly because of super-added critical illness anemia. Hematinic studies revealed iron deficiency. GI work up may prove necessary, on discharge.  Comment: Patient is making good progress. I feel he will be ready for discharge in the next few days.     LOS: 10 days   [name],CHRISTOPHER 04/22/2012, 9:30 AM

## 2012-04-22 NOTE — Evaluation (Signed)
Physical Therapy Evaluation Patient Details Name: [name] MRN: [id number] DOB: November 27, 1961 Today's Date: 04/22/2012 Time: 8657-8469 PT Time Calculation (min): 36 min  PT Assessment / Plan / Recommendation Clinical Impression  Mr. Zappia is 51 y/o male admitted with sepsis (neck, ilieo psoas, left arm, and right foot). Presents to PT today with decreased mobility and strength and will benefit physical therapy in the acute setting to address these and facilitate a safe d/c home with needed DME. Likely no PT f/u but will need a left platform RW and 3in1.     PT Assessment  Patient needs continued PT services    Follow Up Recommendations  No PT follow up    Equipment Recommendations  Left Platform rolling walker   Frequency Min 3X/week    Precautions / Restrictions Precautions Precautions: None Restrictions Weight Bearing Restrictions: Yes LUE Weight Bearing: Weight bear through elbow only Other Position/Activity Restrictions: No wbing through left hand         Mobility  Bed Mobility Supine to Sit: 6: Modified independent (Device/Increase time) Sitting - Scoot to Edge of Bed: 6: Modified independent (Device/Increase time) Sit to Supine: 6: Modified independent (Device/Increase time) Scooting to [facility]: 6: Modified independent (Device/Increase time) Transfers Sit to Stand: 5: Supervision;With upper extremity assist Stand to Sit: 6: Modified independent (Device/Increase time) Ambulation/Gait Ambulation/Gait Assistance: 5: Supervision Ambulation Distance (Feet): 250 Feet Assistive device: Left platform walker Ambulation/Gait Assistance Details: cues for upright posture and to decrease left lateral lean (possibly a change needed in the height of the platform); good maneuvering of the walker however, no physical assist; slightly slower gait; pt denies wanting to try the [facility] or QC saying that he feels most safe with walker at this time Gait Pattern: Lateral trunk lean to  left;Decreased step length - left;Decreased step length - right Stairs: Yes Stairs Assistance: 4: Min assist Stairs Assistance Details (indicate cue type and reason): cues for safe technique and to steady himself especially when descending stairs (pt very fearful of heights)  Stair Management Technique: One rail Right;Step to pattern;Forwards Number of Stairs: 3     Exercises Other Exercises Other Exercises: Ed pt on performing LAQ for strengthing LLE.    PT Goals Acute Rehab PT Goals PT Goal Formulation: With patient Time For Goal Achievement: 04/29/12 Potential to Achieve Goals: Good Pt will go Sit to Stand: Independently PT Goal: Sit to Stand - Progress: Goal set today Pt will go Stand to Sit: Independently Pt will Transfer Bed to Chair/Chair to Bed: with modified independence PT Transfer Goal: Bed to Chair/Chair to Bed - Progress: Goal set today Pt will Ambulate: >150 feet;with modified independence;with least restrictive assistive device PT Goal: Ambulate - Progress: Goal set today Pt will Go Up / Down Stairs: 6-9 stairs;with modified independence;with rail(s) (rail on the right) PT Goal: Up/Down Stairs - Progress: Goal set today Pt will Perform Home Exercise Program: Independently PT Goal: Perform Home Exercise Program - Progress: Goal set today  Visit Information  Last PT Received On: 04/22/12 Assistance Needed: +1    Subjective Data  Subjective: That thing (the rolling walker) is the only way I can get up and move around.  Patient Stated Goal: to go home   Prior Functioning  Home Living Lives With: Spouse Available Help at Discharge: Family (in evenings only) Type of Home: House Home Access: Stairs to enter [employer] of Steps: 1 Entrance Stairs-Rails: Right (going up) Home Layout: Two level;Bed/bath upstairs Alternate Level Stairs-Number of Steps: once upstairs  he can stay there during the day Alternate Level Stairs-Rails: Right (going up) Home  Adaptive Equipment: None Prior Function Level of Independence: Independent Able to Take Stairs?: Reciprically Driving: Yes Vocation: Unemployed Communication Communication: No difficulties    Cognition  Overall Cognitive Status: Appears within functional limits for tasks assessed/performed Arousal/Alertness: Awake/alert Orientation Level: Appears intact for tasks assessed Behavior During Session: [facility] for tasks performed    Extremity/Trunk Assessment Right Upper Extremity Assessment RUE ROM/Strength/Tone:  (defer to OT eval) Left Upper Extremity Assessment LUE ROM/Strength/Tone:  (defer to OT eval) Right Lower Extremity Assessment RLE ROM/Strength/Tone: Deficits RLE ROM/Strength/Tone Deficits: slightly limited DF AROM (grossly neutral) RLE Sensation: WFL - Light Touch;WFL - Proprioception RLE Coordination: WFL - gross/fine motor Left Lower Extremity Assessment LLE ROM/Strength/Tone: Deficits LLE ROM/Strength/Tone Deficits: grossly 4/5 throughout (pt reports this legs has been weaker for the past few weeks) LLE Sensation: WFL - Light Touch;WFL - Proprioception LLE Coordination: WFL - gross/fine motor Trunk Assessment Trunk Assessment: Normal   Balance    End of Session PT - End of Session Equipment Utilized During Treatment: Gait belt Activity Tolerance: Patient tolerated treatment well Patient left: in bed;with call bell/phone within reach;with family/visitor present   [facility] HELEN 04/22/2012, 5:40 PM

## 2012-04-23 LAB — COMPREHENSIVE METABOLIC PANEL
AST: 133 U/L — ABNORMAL HIGH (ref 0–37)
Albumin: 1.6 g/dL — ABNORMAL LOW (ref 3.5–5.2)
BUN: 7 mg/dL (ref 6–23)
Calcium: 8.4 mg/dL (ref 8.4–10.5)
Chloride: 99 mEq/L (ref 96–112)
Creatinine, Ser: 0.63 mg/dL (ref 0.50–1.35)
Total Bilirubin: 0.8 mg/dL (ref 0.3–1.2)

## 2012-04-23 LAB — CBC
HCT: 26.7 % — ABNORMAL LOW (ref 39.0–52.0)
MCH: 21.6 pg — ABNORMAL LOW (ref 26.0–34.0)
MCHC: 32.6 g/dL (ref 30.0–36.0)
MCV: 66.3 fL — ABNORMAL LOW (ref 78.0–100.0)
Platelets: 571 10*3/uL — ABNORMAL HIGH (ref 150–400)
RDW: 15.1 % (ref 11.5–15.5)

## 2012-04-23 MED ORDER — PREDNISONE 20 MG PO TABS
40.0000 mg | ORAL_TABLET | Freq: Every day | ORAL | Status: AC
  Administered 2012-04-24 – 2012-04-26 (×3): 40 mg via ORAL
  Filled 2012-04-23 (×4): qty 2

## 2012-04-23 MED ORDER — PREDNISONE 20 MG PO TABS
40.0000 mg | ORAL_TABLET | Freq: Once | ORAL | Status: AC
  Administered 2012-04-23: 40 mg via ORAL
  Filled 2012-04-23: qty 2

## 2012-04-23 MED ORDER — ACETAMINOPHEN 325 MG PO TABS
650.0000 mg | ORAL_TABLET | Freq: Four times a day (QID) | ORAL | Status: DC | PRN
  Administered 2012-04-23 – 2012-04-27 (×2): 650 mg via ORAL
  Filled 2012-04-23 (×3): qty 2

## 2012-04-23 NOTE — Progress Notes (Signed)
MD made aware of increased temp.

## 2012-04-23 NOTE — Progress Notes (Signed)
PT Cancellation Note  Treatment cancelled today due to patient's refusal to participate. Pt reports he can't participate this morning because last night "took a lot out of him" with his PICC line insertion and his room getting to hot. Will f/u at 12 noon for our treatment session.   [facility] HELEN 04/23/2012, 9:16 AM Pager: [phone]

## 2012-04-23 NOTE — Progress Notes (Signed)
04/23/2012 8:45 AM  [name], [name] [id number]  Post-Op Day 6    Temp:  [98.7 F (37.1 C)-100 F (37.8 C)] 99 F (37.2 C) (04/30 0528) Pulse Rate:  [99-103] 103  (04/30 0528) Resp:  [16-18] 16  (04/30 0528) BP: (131-161)/(75-89) 161/80 mmHg (04/30 0528) SpO2:  [94 %-96 %] 94 % (04/30 0528),     Intake/Output Summary (Last 24 hours) at 04/23/12 0845 Last data filed at 04/23/12 0819  Gross per 24 hour  Intake    820 ml  Output   2325 ml  Net  -1505 ml    Results for orders placed during the hospital encounter of 04/12/12 (from the past 24 hour(s))  CBC     Status: Abnormal   Collection Time   04/23/12  5:22 AM      Component Value Range   WBC 9.8  4.0 - 10.5 (K/uL)   RBC 4.03 (*) 4.22 - 5.81 (MIL/uL)   Hemoglobin 8.7 (*) 13.0 - 17.0 (g/dL)   HCT 95.2 (*) 84.1 - 52.0 (%)   MCV 66.3 (*) 78.0 - 100.0 (fL)   MCH 21.6 (*) 26.0 - 34.0 (pg)   MCHC 32.6  30.0 - 36.0 (g/dL)   RDW 32.4  40.1 - 02.7 (%)   Platelets 571 (*) 150 - 400 (K/uL)  COMPREHENSIVE METABOLIC PANEL     Status: Abnormal   Collection Time   04/23/12  5:22 AM      Component Value Range   Sodium 135  135 - 145 (mEq/L)   Potassium 3.6  3.5 - 5.1 (mEq/L)   Chloride 99  96 - 112 (mEq/L)   CO2 25  19 - 32 (mEq/L)   Glucose, Bld 93  70 - 99 (mg/dL)   BUN 7  6 - 23 (mg/dL)   Creatinine, Ser 2.53  0.50 - 1.35 (mg/dL)   Calcium 8.4  8.4 - 66.4 (mg/dL)   Total Protein 7.0  6.0 - 8.3 (g/dL)   Albumin 1.6 (*) 3.5 - 5.2 (g/dL)   AST 403 (*) 0 - 37 (U/L)   ALT 72 (*) 0 - 53 (U/L)   Alkaline Phosphatase 133 (*) 39 - 117 (U/L)   Total Bilirubin 0.8  0.3 - 1.2 (mg/dL)   GFR calc non Af Amer >90  >90 (mL/min)   GFR calc Af Amer >90  >90 (mL/min)    SUBJECTIVE:  Feeling stronger.  LEFT hand is primary source of pain.  Able to ambulate yest.  No SOB.  No swallowing diff.  Neck more flexible.  Still some tenderness on RIGHT upper chest wall  OBJECTIVE:  Small purulent drainage.  Neck supple and flat.  Upper chest wall  tender, swollen, indurated.  No erythema.    IMPRESSION:  WBC down.  Hct also down.  No fever.  Some concern over RIGHT upper chest wall status, but no worse per patient.  PLAN:  Dressing changes.  Observe RIGHT chest wall.  If failing to resolve or worsening in any way, repeat CT neck with contrast.  Flo Shanks

## 2012-04-23 NOTE — Progress Notes (Signed)
Occupational Therapy Treatment Patient Details Name: ALEKAI POCOCK MRN: [id number] DOB: Nov 10, 1961 Today's Date: 04/23/2012 Time: 2440-1027 OT Time Calculation (min): 27 min  OT Assessment / Plan / Recommendation Comments on Treatment Session Increased SROM in supination today with pt getting from full pronation to almost neutral in LUE    Follow Up Recommendations  Home health OT    Equipment Recommendations  3 in 1 bedside comode    Frequency Min 3X/week   Plan Discharge plan remains appropriate    Precautions / Restrictions Precautions Precautions: None Restrictions Weight Bearing Restrictions: Yes LUE Weight Bearing: Weight bear through elbow only   Pertinent Vitals/Pain Increases with exercises in LUE    ADL       OT Goals Arm Goals Arm Goal: PROM - Progress: Progressing toward goal (still with pain, tolerating 10 reps) Arm Goal: Additional Goal #1 - Progress: Progressing toward goals (supervision with VC's)  Visit Information  Last OT Received On: 04/23/12 Assistance Needed: +1    Subjective Data  Subjective: I am worn out from the PICC line placement but I can do arm exercises here in the bed   Prior Functioning       Cognition  Overall Cognitive Status: Appears within functional limits for tasks assessed/performed Arousal/Alertness: Awake/alert Behavior During Session: [facility] for tasks performed Cognition - Other Comments: Appears WFL, however question it pt really following through with all exercises for LUE due to only gain seen today is with supination.    Mobility     Exercises Other Exercises Other Exercises: Pt performed 10 reps of SROM with composite flexion/extension of digits with Mod VCs for technique, 10 reps of SROM supination from full pronation to almost neutral position with min VC's for technique, 10 reps of AROM elbow flexion/extension, and  10 reps of AROM shoulder flexion/extension. Encouraged pt to do these 4 more times throughout the day  10 reps each Other Exercises: Also went over positioning again with pt--needs 3 pillows not 2 for maximum elevation.  Balance    End of Session OT - End of Session Activity Tolerance: Patient tolerated treatment well Patient left: in bed;with call bell/phone within reach   Evette Georges [phone] 04/23/2012, 10:56 AM

## 2012-04-23 NOTE — Progress Notes (Signed)
Subjective: No new complaints   Antibiotics:  Anti-infectives     Start     Dose/Rate Route Frequency Ordered Stop   04/17/12 1600   clindamycin (CLEOCIN) IVPB 900 mg  Status:  Discontinued        900 mg 100 mL/hr over 30 Minutes Intravenous Every 8 hours 04/17/12 1454 04/21/12 1249   04/17/12 1600  Ampicillin-Sulbactam (UNASYN) 3 g in sodium chloride 0.9 % 100 mL IVPB       3 g 100 mL/hr over 60 Minutes Intravenous Every 6 hours 04/17/12 1455     04/14/12 1400   ceFAZolin (ANCEF) IVPB 1 g/50 mL premix  Status:  Discontinued        1 g 100 mL/hr over 30 Minutes Intravenous 3 times per day 04/14/12 1057 04/17/12 1426   04/13/12 1900   vancomycin (VANCOCIN) 500 mg in sodium chloride 0.9 % 100 mL IVPB  Status:  Discontinued        500 mg 100 mL/hr over 60 Minutes Intravenous Every 12 hours 04/13/12 1601 04/14/12 1058   04/13/12 0815   moxifloxacin (AVELOX) IVPB 400 mg        400 mg 250 mL/hr over 60 Minutes Intravenous  Once 04/13/12 0805 04/13/12 0953   04/13/12 0500   vancomycin (VANCOCIN) IVPB 1000 mg/200 mL premix  Status:  Discontinued        1,000 mg 200 mL/hr over 60 Minutes Intravenous Every 12 hours 04/13/12 0453 04/13/12 1414   04/13/12 0445   vancomycin (VANCOCIN) IVPB 1000 mg/200 mL premix        1,000 mg 200 mL/hr over 60 Minutes Intravenous  Once 04/13/12 0442 04/13/12 0607          Medications: Scheduled Meds:    . ampicillin-sulbactam (UNASYN) IV  3 g Intravenous Q6H  . enoxaparin (LOVENOX) injection  40 mg Subcutaneous Q24H  . mupirocin ointment   Topical BID  . nystatin  10 mL Oral QID  . predniSONE  40 mg Oral Q breakfast  . predniSONE  40 mg Oral Once  . sodium chloride  3 mL Intravenous Q12H   Continuous Infusions:   PRN Meds:.ketorolac, morphine injection, ondansetron (ZOFRAN) IV, ondansetron, phenol, sodium chloride   Objective: Weight change:   Intake/Output Summary (Last 24 hours) at 04/23/12 1412 Last data filed at 04/23/12 0917  Gross per 24 hour  Intake   1220 ml  Output   2725 ml  Net  -1505 ml   Blood pressure 153/80, pulse 111, temperature 99.6 F (37.6 C), temperature source Oral, resp. rate 18, height 5\' 4"  (1.626 m), weight 134 lb 7.7 oz (61 kg), SpO2 95.00%. Temp:  [98.7 F (37.1 C)-99.6 F (37.6 C)] 99.6 F (37.6 C) (04/30 0900) Pulse Rate:  [102-111] 111  (04/30 0900) Resp:  [16-18] 18  (04/30 0900) BP: (153-161)/(75-80) 153/80 mmHg (04/30 0900) SpO2:  [94 %-95 %] 95 % (04/30 0900)  Physical Exam: General: Alert and awake, oriented x3, not in any acute distress. HEENT: anicteric sclera, pupils reactive to light and accommodation, EOMI CVS regular rate, normal r,  no murmur rubs or gallops Chest: clear to auscultation bilaterally, no wheezing, rales or rhonchi Abdomen: soft nontender, nondistended, normal bowel sounds, Skin/soft tissue neck is copious drainage from woundleft arm with dressing, right ankle joint is swollen and tender, neck surgical site clean Right thigh with less tenderness, right ankle and foot with tenderness Neuro: nonfocal, strength and sensation intact    Neuro: nonfocal  Lab Results:  Basename 04/23/12 0522 04/22/12 0522  WBC 9.8 12.7*  HGB 8.7* 9.5*  HCT 26.7* 28.8*  PLT 571* 564*    BMET  Basename 04/23/12 0522 04/22/12 0522  NA 135 134*  K 3.6 3.9  CL 99 100  CO2 25 25  GLUCOSE 93 99  BUN 7 7  CREATININE 0.63 0.64  CALCIUM 8.4 8.5    Micro Results: Recent Results (from the past 240 hour(s))  CULTURE, BLOOD (ROUTINE X 2)     Status: Normal (Preliminary result)   Collection Time   04/17/12  4:00 PM      Component Value Range Status Comment   Specimen Description BLOOD RIGHT HAND   Final    Special Requests BOTTLES DRAWN AEROBIC AND ANAEROBIC 10CC   Final    Culture  Setup Time [id number]   Final    Culture     Final    Value:        BLOOD CULTURE RECEIVED NO GROWTH TO DATE CULTURE WILL BE HELD FOR 5 DAYS BEFORE ISSUING A FINAL NEGATIVE REPORT    Report Status PENDING   Incomplete   CULTURE, BLOOD (ROUTINE X 2)     Status: Normal (Preliminary result)   Collection Time   04/17/12  4:05 PM      Component Value Range Status Comment   Specimen Description BLOOD RIGHT HAND   Final    Special Requests BOTTLES DRAWN AEROBIC AND ANAEROBIC 10CC   Final    Culture  Setup Time [id number]   Final    Culture     Final    Value:        BLOOD CULTURE RECEIVED NO GROWTH TO DATE CULTURE WILL BE HELD FOR 5 DAYS BEFORE ISSUING A FINAL NEGATIVE REPORT   Report Status PENDING   Incomplete   SURGICAL PCR SCREEN     Status: Normal   Collection Time   04/17/12  9:32 PM      Component Value Range Status Comment   MRSA, PCR NEGATIVE  NEGATIVE  Final    Staphylococcus aureus NEGATIVE  NEGATIVE  Final   GRAM STAIN     Status: Normal   Collection Time   04/17/12 11:48 PM      Component Value Range Status Comment   Specimen Description ABSCESS NECK RIGHT   Final    Special Requests PATIENT ON FOLLOWING UNASYN   Final    Gram Stain     Final    Value: FEW WBC PRESENT, PREDOMINANTLY PMN CORRECTED ON 04/25 AT 1554: PREVIOUSLY REPORTED AS NO WBC SEEN     RARE GRAM POSITIVE COCCI IN PAIRS IN CHAINS CORRECTED ON 04/25 AT 1555: PREVIOUSLY REPORTED AS NO ORGANISMS SEEN     CALLED [facility] Mendel Corning RN 04/18/12 0040 ACAINJ     REPORT CORRECTED AND CALLED TO BETH BRADT, RN 04/18/12 1555 BY K SCHULTZ   Report Status 04/18/2012 FINAL   Final   ANAEROBIC CULTURE     Status: Normal   Collection Time   04/17/12 11:48 PM      Component Value Range Status Comment   Specimen Description ABSCESS NECK RIGHT   Final    Special Requests PATIENT ON FOLLOWING UNASYN   Final    Gram Stain     Final    Value: FEW WBC PRESENT, PREDOMINANTLY PMN     RARE GRAM POSITIVE COCCI     IN PAIRS IN CHAINS   Culture NO ANAEROBES ISOLATED   Final  Report Status 04/22/2012 FINAL   Final   CULTURE, ROUTINE-ABSCESS     Status: Normal   Collection Time   04/17/12 11:48 PM      Component Value  Range Status Comment   Specimen Description ABSCESS NECK RIGHT   Final    Special Requests PATIENT ON FOLLOWING UNASYN   Final    Gram Stain     Final    Value: FEW WBC PRESENT, PREDOMINANTLY PMN     RARE GRAM POSITIVE COCCI     IN PAIRS IN CHAINS Performed at [facility] Gram Stain Report Called to,Read Back By and Verified With: Gram Stain Report Called to,Read Back By and Verified With: [facility] JOHNSON J RN 04/18/12 0040 ACAINJ Gram Stain previously reported as No WBC seen      and No Organisms seen CORRECTED RESULTS CALLED TO: KIM S AT [facility] MICRO [id number] 1300 BY WOODB   Culture RARE GROUP A STREP (S.PYOGENES) ISOLATED   Final    Report Status 04/20/2012 FINAL   Final   CULTURE, ROUTINE-ABSCESS     Status: Normal   Collection Time   04/18/12  9:19 PM      Component Value Range Status Comment   Specimen Description ABSCESS GROIN LEFT   Final    Special Requests PSOAS ASPIRATION   Final    Gram Stain     Final    Value: MODERATE WBC PRESENT,BOTH PMN AND MONONUCLEAR     NO SQUAMOUS EPITHELIAL CELLS SEEN     RARE GRAM POSITIVE COCCI IN PAIRS   Culture NO GROWTH 3 DAYS   Final    Report Status 04/22/2012 FINAL   Final     Studies/Results: Mr Foot Right W Wo Contrast  04/22/2012  *RADIOLOGY REPORT*  Clinical Data: Right ankle pain and swelling.  History of sepsis.  MRI OF THE RIGHT FOREFOOT WITHOUT AND WITH CONTRAST  Technique:  Multiplanar, multisequence MR imaging was performed both before and after administration of intravenous contrast.  Contrast: 12mL MULTIHANCE GADOBENATE DIMEGLUMINE 529 MG/ML IV SOLN  Comparison: None  Findings: There is diffuse subcutaneous soft tissue swelling/edema most notable along the dorsum of the foot.  There is also edema like signal abnormality involving the foot musculature and fluid in the fascial planes.  The findings suggest cellulitis and myofasciitis.  No discrete drainable soft tissue abscess.  No findings for septic arthritis or osteomyelitis.  The major  ankle and foot tendons and ligaments are intact.  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  IMPRESSION:  1.  Cellulitis and myofasciitis without findings for septic arthritis, osteomyelitis or focal soft tissue abscess. 2.  Plantar fibroma.  Original Report Authenticated By: P. Loralie Champagne, M.D.      Assessment/Plan: [name] is a 51 y.o. male with:   1)SEVERE GROUP A STREPTOCOCCAL INFECTION WITH BACTEREMIA AND MX METASATIC FOCI OF INFECTION  A)NEck abscess, mediastinal abscess with septic [place] joint:  --he is sp surgery by Dr. Lazarus Salines and hopefully he was able to drain all of the fluid out from the mediastinum as well:  --worry about the [place] joint and mediastinum  --WILL GET CT NECK TOMORROW INTO CHEST --will need protracted therapy and repeat imaging along the way   B) Hand infection: Greatly appreciate Ortho hand surgery here  C) Thigh : appreciate orthopedics and IR help --will ikley need repeat imaging down the road   D) Right ankle: MRI shows fascitis, myositis but  no abscess. --follow clnically   E) Bacteremia: --ensure repeat blood cutlures clearing  --CONTINUE UNASYN will likely change to rocephin once daily dosing for convenience at [place]  Dr. Ninetta Lights is covering tomorrow.    LOS: 11 days   Acey Lav 04/23/2012, 2:12 PM

## 2012-04-23 NOTE — Progress Notes (Signed)
Subjective: No new issues.  Objective: Vital signs in last 24 hours: Temp:  [98.7 F (37.1 C)-100 F (37.8 C)] 99.6 F (37.6 C) (04/30 0900) Pulse Rate:  [100-111] 111  (04/30 0900) Resp:  [16-18] 18  (04/30 0900) BP: (131-161)/(75-89) 153/80 mmHg (04/30 0900) SpO2:  [94 %-96 %] 95 % (04/30 0900) Weight change:  Last BM Date: 04/22/12  Intake/Output from previous day: 04/29 0701 - 04/30 0700 In: 1060 [P.O.:960; IV Piggyback:100] Out: 1900 [Urine:1900] Total I/O In: 640 [P.O.:640] Out: 1025 [Urine:1025]   Physical Exam: General: Comfortable, alert, communicative, fully oriented, not short of breath at rest.  HEENT:  Mild clinical pallor, no jaundice, no conjunctival injection or discharge. Hydration appears fair. NECK:  Supple, has dressings, which are clean and dry today. CHEST:  Clinically clear to auscultation, no wheezes, no crackles. HEART:  Sounds 1 and 2 heard, normal, regular, no murmurs. ABDOMEN:  Full, soft, non-tender, no palpable organomegaly, no palpable masses, normal bowel sounds. GENITALIA:  Not examined. UPPER EXTREMITIES: Left hand and forearm are heavily bandaged.  LOWER EXTREMITIES:  No pitting edema, palpable peripheral pulses. Dorsum of right foot is swollen, but right ankle is non-tender, and has full ROM. Right 1st MTP is tender, and has restricted ROM, secondary to pain. MUSCULOSKELETAL SYSTEM:  As above, otherwise normal. CENTRAL NERVOUS SYSTEM:  No focal neurologic deficit on gross examination.  Lab Results:  Basename 04/23/12 0522 04/22/12 0522  WBC 9.8 12.7*  HGB 8.7* 9.5*  HCT 26.7* 28.8*  PLT 571* 564*    Basename 04/23/12 0522 04/22/12 0522  NA 135 134*  K 3.6 3.9  CL 99 100  CO2 25 25  GLUCOSE 93 99  BUN 7 7  CREATININE 0.63 0.64  CALCIUM 8.4 8.5   Recent Results (from the past 240 hour(s))  CULTURE, BLOOD (ROUTINE X 2)     Status: Normal (Preliminary result)   Collection Time   04/17/12  4:00 PM      Component Value Range  Status Comment   Specimen Description BLOOD RIGHT HAND   Final    Special Requests BOTTLES DRAWN AEROBIC AND ANAEROBIC 10CC   Final    Culture  Setup Time [id number]   Final    Culture     Final    Value:        BLOOD CULTURE RECEIVED NO GROWTH TO DATE CULTURE WILL BE HELD FOR 5 DAYS BEFORE ISSUING A FINAL NEGATIVE REPORT   Report Status PENDING   Incomplete   CULTURE, BLOOD (ROUTINE X 2)     Status: Normal (Preliminary result)   Collection Time   04/17/12  4:05 PM      Component Value Range Status Comment   Specimen Description BLOOD RIGHT HAND   Final    Special Requests BOTTLES DRAWN AEROBIC AND ANAEROBIC 10CC   Final    Culture  Setup Time [id number]   Final    Culture     Final    Value:        BLOOD CULTURE RECEIVED NO GROWTH TO DATE CULTURE WILL BE HELD FOR 5 DAYS BEFORE ISSUING A FINAL NEGATIVE REPORT   Report Status PENDING   Incomplete   SURGICAL PCR SCREEN     Status: Normal   Collection Time   04/17/12  9:32 PM      Component Value Range Status Comment   MRSA, PCR NEGATIVE  NEGATIVE  Final    Staphylococcus aureus NEGATIVE  NEGATIVE  Final   GRAM STAIN  Status: Normal   Collection Time   04/17/12 11:48 PM      Component Value Range Status Comment   Specimen Description ABSCESS NECK RIGHT   Final    Special Requests PATIENT ON FOLLOWING UNASYN   Final    Gram Stain     Final    Value: FEW WBC PRESENT, PREDOMINANTLY PMN CORRECTED ON 04/25 AT 1554: PREVIOUSLY REPORTED AS NO WBC SEEN     RARE GRAM POSITIVE COCCI IN PAIRS IN CHAINS CORRECTED ON 04/25 AT 1555: PREVIOUSLY REPORTED AS NO ORGANISMS SEEN     CALLED [facility] Mendel Corning RN 04/18/12 0040 ACAINJ     REPORT CORRECTED AND CALLED TO BETH BRADT, RN 04/18/12 1555 BY K SCHULTZ   Report Status 04/18/2012 FINAL   Final   ANAEROBIC CULTURE     Status: Normal   Collection Time   04/17/12 11:48 PM      Component Value Range Status Comment   Specimen Description ABSCESS NECK RIGHT   Final    Special Requests PATIENT ON  FOLLOWING UNASYN   Final    Gram Stain     Final    Value: FEW WBC PRESENT, PREDOMINANTLY PMN     RARE GRAM POSITIVE COCCI     IN PAIRS IN CHAINS   Culture NO ANAEROBES ISOLATED   Final    Report Status 04/22/2012 FINAL   Final   CULTURE, ROUTINE-ABSCESS     Status: Normal   Collection Time   04/17/12 11:48 PM      Component Value Range Status Comment   Specimen Description ABSCESS NECK RIGHT   Final    Special Requests PATIENT ON FOLLOWING UNASYN   Final    Gram Stain     Final    Value: FEW WBC PRESENT, PREDOMINANTLY PMN     RARE GRAM POSITIVE COCCI     IN PAIRS IN CHAINS Performed at [facility] Gram Stain Report Called to,Read Back By and Verified With: Gram Stain Report Called to,Read Back By and Verified With: [facility] JOHNSON J RN 04/18/12 0040 ACAINJ Gram Stain previously reported as No WBC seen      and No Organisms seen CORRECTED RESULTS CALLED TO: KIM S AT [facility] MICRO [id number] 1300 BY WOODB   Culture RARE GROUP A STREP (S.PYOGENES) ISOLATED   Final    Report Status 04/20/2012 FINAL   Final   CULTURE, ROUTINE-ABSCESS     Status: Normal   Collection Time   04/18/12  9:19 PM      Component Value Range Status Comment   Specimen Description ABSCESS GROIN LEFT   Final    Special Requests PSOAS ASPIRATION   Final    Gram Stain     Final    Value: MODERATE WBC PRESENT,BOTH PMN AND MONONUCLEAR     NO SQUAMOUS EPITHELIAL CELLS SEEN     RARE GRAM POSITIVE COCCI IN PAIRS   Culture NO GROWTH 3 DAYS   Final    Report Status 04/22/2012 FINAL   Final      Studies/Results: Mr Foot Right W Wo Contrast  04/22/2012  *RADIOLOGY REPORT*  Clinical Data: Right ankle pain and swelling.  History of sepsis.  MRI OF THE RIGHT FOREFOOT WITHOUT AND WITH CONTRAST  Technique:  Multiplanar, multisequence MR imaging was performed both before and after administration of intravenous contrast.  Contrast: 12mL MULTIHANCE GADOBENATE DIMEGLUMINE 529 MG/ML IV SOLN  Comparison: None  Findings: There is diffuse  subcutaneous soft tissue swelling/edema most notable  along the dorsum of the foot.  There is also edema like signal abnormality involving the foot musculature and fluid in the fascial planes.  The findings suggest cellulitis and myofasciitis.  No discrete drainable soft tissue abscess.  No findings for septic arthritis or osteomyelitis.  The major ankle and foot tendons and ligaments are intact.  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  IMPRESSION:  1.  Cellulitis and myofasciitis without findings for septic arthritis, osteomyelitis or focal soft tissue abscess. 2.  Plantar fibroma.  Original Report Authenticated By: P. Loralie Champagne, M.D.    Medications: Scheduled Meds:    . ampicillin-sulbactam (UNASYN) IV  3 g Intravenous Q6H  . enoxaparin (LOVENOX) injection  40 mg Subcutaneous Q24H  . mupirocin ointment   Topical BID  . nystatin  10 mL Oral QID  . sodium chloride  3 mL Intravenous Q12H   Continuous Infusions:  PRN Meds:.ketorolac, morphine injection, ondansetron (ZOFRAN) IV, ondansetron, phenol, sodium chloride  Assessment/Plan:  Active Problems:  1. Bacteremia due to Group A Streptococcus/ Sepsis: Patient had an upper respiratory infection on April 13 for which he sought medical care but did not receive antibiotics and it is presumed he was treated for a viral upper respiratory infection. He failed to improve, subsequently developed more constitutional symptoms and presented to our hospital with current condition. Septic work up revealed Group A streptococcal bacteremia, with multiple metastatic infective foci. ID consultation was provided by Dr Acey Lav, who was very helpful in rationalizing antibiotic therapy. Patient is currently on Unasyn day# 7, but will be transitioned to Rocephin monotherapy on discharge. Duration is unclear at this time. 2D Echocardiogram of 04/18/12, showed normal left ventricular cavity size, mild  concentric hypertrophy and ejection fraction of 60% to 65%, with no regional wall motion abnormalities. A small to moderate pericardial effusion was identified circumferential to the heart and no vegetations were identified. Dr Dietrich Pates, [place] cardiologist, has  reviewed the echocardiogram. In her opinion the effusion is actually small and no tamponade physiology is seen.  2. Multiple septic metastatic foci/abscesses and cellulitis: Patient presented with a swollen left hand and arm, as we;ll as generalized pains. Evaluation, revealed multiple foci of infection, including neck abscess with extension into right chest wall/Sterno-clavicular joint and mediastinum, cellulitis and abscess of Left hand/Left Psoas abscess/ Right ankle and thigh swelling with pain and erythema- ? Evolving abscess. ENT consult was provided by Dr Flo Shanks, who performed darinage of neck/mediastinal abscesses, Dr Bradly Bienenstock carried out debridement of LUE and aspiration of right ankle on 04/18/12. Aspiration of left  iliopsoas abscess was performed by Dr Oley Balm,  interventional radiologist, on the same date. Patient had a transaminitis, likely due to sepsis, but this is improving with treatment. Per Dr Lazarus Salines, repeat neck CT, may prove necessary. 3. Acute respiratory failure with hypoxia/ Pleural effusion, bilateral with compressive atelectasis:  Patient had transient hypoxia, soon after hospitalization and procedures. This was felt multifactorial, secondary to pleural effusions and compressive atelectasis,as well a s oioid analgesics. He was deemed at risk for developing HCAP, but recovered with incentive spirometry and oxygen supplementation. 4. Thrush: An incidental finding. Improving with Nystatin swish and swallow.  5. Anemia:   Patient had a mild microcytic anemia, with Hemoglobin of 11.2 and MCV of 67.1 at presentation. During this hospitalization, hemoglobin has, possibly because of super-added critical  illness anemia. Hematinic studies revealed iron deficiency. GI work up may prove necessary, on  discharge. 6. Gout: Patient has a history of gout, and today, has right 1st MTP joint swelling, redness and pain. Will place on a 5-day course of oral Prednisone.  Comment: Patient is making good progress.     LOS: 11 days   [name],CHRISTOPHER 04/23/2012, 12:51 PM

## 2012-04-23 NOTE — Progress Notes (Signed)
Physical Therapy Treatment Patient Details Name: [name] MRN: [id number] DOB: November 11, 1961 Today's Date: 04/23/2012 Time: 4098-1191 PT Time Calculation (min): 30 min  PT Assessment / Plan / Recommendation Comments on Treatment Session  [name] was moving slower today with decreased activity tolerance. Will need to be much safe on these stairs if he is to be at home during the day alone. He reports his son can be there to help him. I have updated his frequency so that he may get more mobile prior to d/c.     Follow Up Recommendations  No PT follow up    Equipment Recommendations   (left platform walker)    Frequency Min 4X/week   Plan Discharge plan remains appropriate;Frequency remains appropriate    Precautions / Restrictions Precautions Precautions: None Restrictions Weight Bearing Restrictions: Yes LUE Weight Bearing: Weight bear through elbow only       Mobility  Bed Mobility Supine to Sit: 4: Min assist Sitting - Scoot to Edge of Bed: 6: Modified independent (Device/Increase time) Transfers Sit to Stand: From bed;From chair/3-in-1;With armrests;With upper extremity assist;4: Min guard Stand to Sit: 5: Supervision;With upper extremity assist;To chair/3-in-1;With armrests Details for Transfer Assistance: cues for safe hand placement and close gaurding for sit->stand for follow through as he was struggling a little bit more today because of pain and stiffness Ambulation/Gait Ambulation/Gait Assistance: 5: Supervision Ambulation Distance (Feet): 140 Feet Assistive device: Left platform walker Ambulation/Gait Assistance Details: slower gait speed today and lateral flexion regardless of platform height (pt reports pain his left hip as well which is why he is shifting his trunk right and laterally flexing); cues for more midline and upright posture so as to protect his back and left shoulder but pt resistant to this cueing Gait Pattern: Step-to pattern;Lateral trunk lean  to left Stairs Assistance: 4: Min assist Stairs Assistance Details (indicate cue type and reason): minA to ascend the steps forward with rail on the right; pt then very fearful to come back down (even after demonstration and verbal cues to take stairs sideways) needing increased gaurding (+2) because of fear and more instability to come down the steps Stair Management Technique: One rail Right;Forwards;Sideways    Exercises General Exercises - Lower Extremity Ankle Circles/Pumps: AROM;10 reps;Right Long Arc Quad: AROM;Both;5 reps Other Exercises Other Exercises: Pt performed 10 reps of SROM with composite flexion/extension of digits with Mod VCs for technique, 10 reps of SROM supination from full pronation to almost neutral position with min VC's for technique, 10 reps of AROM elbow flexion/extension, and  10 reps of AROM shoulder flexion/extension. Encouraged pt to do these 4 more times throughout the day 10 reps each Other Exercises: Also went over positioning again with pt--needs 3 pillows not 2 for maximum elevation.   PT Goals Acute Rehab PT Goals PT Goal: Sit to Stand - Progress: Progressing toward goal PT Goal: Stand to Sit - Progress: Progressing toward goal PT Transfer Goal: Bed to Chair/Chair to Bed - Progress: Progressing toward goal PT Goal: Ambulate - Progress: Progressing toward goal PT Goal: Up/Down Stairs - Progress: Progressing toward goal PT Goal: Perform Home Exercise Program - Progress: Progressing toward goal  Visit Information  Last PT Received On: 04/23/12 Assistance Needed: +2 (+2 helpful today because of increased pain)    Subjective Data  Subjective: I just don't feel good today.   Cognition  Overall Cognitive Status: Impaired Area of Impairment: Safety/judgement Arousal/Alertness: Awake/alert Orientation Level: Appears intact for tasks assessed Behavior During Session: Anxious  Safety/Judgement: Decreased awareness of need for assistance Cognition - Other  Comments: pt reporting that he will be fine at home on the stairs because his stairs have padding and if he falls it will cushion his fall    Balance     End of Session PT - End of Session Equipment Utilized During Treatment: Gait belt Activity Tolerance: Patient limited by fatigue;Patient limited by pain Patient left: in chair;with call bell/phone within reach Nurse Communication: Mobility status    [name] 04/23/2012, 1:48 PM

## 2012-04-24 ENCOUNTER — Inpatient Hospital Stay (HOSPITAL_COMMUNITY): Payer: Medicaid Other

## 2012-04-24 ENCOUNTER — Inpatient Hospital Stay (HOSPITAL_COMMUNITY): Payer: Medicaid Other | Admitting: Anesthesiology

## 2012-04-24 ENCOUNTER — Encounter (HOSPITAL_COMMUNITY): Payer: Self-pay | Admitting: Anesthesiology

## 2012-04-24 ENCOUNTER — Encounter (HOSPITAL_COMMUNITY)
Admission: EM | Disposition: A | Discharge status: Home / Self Care | Payer: Self-pay | Source: Home / Self Care | Attending: Internal Medicine

## 2012-04-24 DIAGNOSIS — R7881 Bacteremia: Secondary | ICD-10-CM

## 2012-04-24 DIAGNOSIS — L0390 Cellulitis, unspecified: Secondary | ICD-10-CM

## 2012-04-24 DIAGNOSIS — L03119 Cellulitis of unspecified part of limb: Secondary | ICD-10-CM

## 2012-04-24 DIAGNOSIS — J9851 Mediastinitis: Secondary | ICD-10-CM

## 2012-04-24 DIAGNOSIS — A409 Streptococcal sepsis, unspecified: Secondary | ICD-10-CM

## 2012-04-24 DIAGNOSIS — L0291 Cutaneous abscess, unspecified: Secondary | ICD-10-CM

## 2012-04-24 DIAGNOSIS — L02519 Cutaneous abscess of unspecified hand: Secondary | ICD-10-CM

## 2012-04-24 HISTORY — PX: LYMPH NODE BIOPSY: SHX201

## 2012-04-24 HISTORY — PX: I&D EXTREMITY: SHX5045

## 2012-04-24 LAB — CBC
HCT: 27.7 % — ABNORMAL LOW (ref 39.0–52.0)
Hemoglobin: 8.9 g/dL — ABNORMAL LOW (ref 13.0–17.0)
RBC: 4.12 MIL/uL — ABNORMAL LOW (ref 4.22–5.81)
WBC: 9.4 10*3/uL (ref 4.0–10.5)

## 2012-04-24 LAB — COMPREHENSIVE METABOLIC PANEL
ALT: 85 U/L — ABNORMAL HIGH (ref 0–53)
Alkaline Phosphatase: 144 U/L — ABNORMAL HIGH (ref 39–117)
BUN: 7 mg/dL (ref 6–23)
CO2: 24 mEq/L (ref 19–32)
Chloride: 100 mEq/L (ref 96–112)
GFR calc Af Amer: >90 mL/min (ref >90)
Glucose, Bld: 121 mg/dL — ABNORMAL HIGH (ref 70–99)
Potassium: 3.7 mEq/L (ref 3.5–5.1)
Sodium: 135 mEq/L (ref 135–145)
Total Bilirubin: 0.7 mg/dL (ref 0.3–1.2)

## 2012-04-24 LAB — CULTURE, BLOOD (ROUTINE X 2)
Culture  Setup Time: 201304250132
Culture: NO GROWTH

## 2012-04-24 SURGERY — LYMPH NODE BIOPSY
Anesthesia: General | Site: Neck | Laterality: Right | Wound class: Dirty or Infected

## 2012-04-24 MED ORDER — FENTANYL CITRATE 0.05 MG/ML IJ SOLN
INTRAMUSCULAR | Status: DC | PRN
  Administered 2012-04-24 (×2): 100 ug via INTRAVENOUS
  Administered 2012-04-24: 50 ug via INTRAVENOUS

## 2012-04-24 MED ORDER — HYDROMORPHONE HCL PF 1 MG/ML IJ SOLN
0.2500 mg | INTRAMUSCULAR | Status: DC | PRN
  Administered 2012-04-24 (×4): 0.5 mg via INTRAVENOUS

## 2012-04-24 MED ORDER — ONDANSETRON HCL 4 MG/2ML IJ SOLN
INTRAMUSCULAR | Status: DC | PRN
  Administered 2012-04-24: 4 mg via INTRAVENOUS

## 2012-04-24 MED ORDER — GLYCOPYRROLATE 0.2 MG/ML IJ SOLN
INTRAMUSCULAR | Status: DC | PRN
  Administered 2012-04-24: 0.3 mg via INTRAVENOUS

## 2012-04-24 MED ORDER — OXYCODONE HCL 5 MG PO TABS
5.0000 mg | ORAL_TABLET | ORAL | Status: DC | PRN
  Administered 2012-04-25 (×4): 10 mg via ORAL
  Administered 2012-04-26 (×3): 15 mg via ORAL
  Administered 2012-04-26: 10 mg via ORAL
  Administered 2012-04-27 (×3): 15 mg via ORAL
  Administered 2012-04-27: 10 mg via ORAL
  Administered 2012-04-28 (×3): 15 mg via ORAL
  Administered 2012-04-28: 5 mg via ORAL
  Administered 2012-04-28 – 2012-04-30 (×10): 15 mg via ORAL
  Administered 2012-05-01 (×3): 10 mg via ORAL
  Administered 2012-05-01 (×3): 15 mg via ORAL
  Administered 2012-05-02: 10 mg via ORAL
  Administered 2012-05-02: 15 mg via ORAL
  Administered 2012-05-02 (×3): 10 mg via ORAL
  Administered 2012-05-03: 15 mg via ORAL
  Administered 2012-05-03: 10 mg via ORAL
  Administered 2012-05-03: 15 mg via ORAL
  Administered 2012-05-03: 10 mg via ORAL
  Administered 2012-05-04: 15 mg via ORAL
  Administered 2012-05-04 (×2): 10 mg via ORAL
  Administered 2012-05-04: 5 mg via ORAL
  Administered 2012-05-04: 10 mg via ORAL
  Administered 2012-05-05 – 2012-05-06 (×7): 15 mg via ORAL
  Administered 2012-05-06: 10 mg via ORAL
  Administered 2012-05-06 – 2012-05-07 (×3): 15 mg via ORAL
  Administered 2012-05-07: 10 mg via ORAL
  Administered 2012-05-07 (×2): 15 mg via ORAL
  Filled 2012-04-24: qty 2
  Filled 2012-04-24 (×3): qty 3
  Filled 2012-04-24 (×5): qty 2
  Filled 2012-04-24 (×3): qty 3
  Filled 2012-04-24: qty 2
  Filled 2012-04-24: qty 3
  Filled 2012-04-24 (×2): qty 2
  Filled 2012-04-24 (×3): qty 3
  Filled 2012-04-24: qty 2
  Filled 2012-04-24 (×3): qty 3
  Filled 2012-04-24: qty 2
  Filled 2012-04-24 (×2): qty 3
  Filled 2012-04-24: qty 2
  Filled 2012-04-24 (×2): qty 3
  Filled 2012-04-24: qty 2
  Filled 2012-04-24 (×5): qty 3
  Filled 2012-04-24: qty 2
  Filled 2012-04-24 (×6): qty 3
  Filled 2012-04-24: qty 2
  Filled 2012-04-24: qty 3
  Filled 2012-04-24: qty 2
  Filled 2012-04-24 (×2): qty 3
  Filled 2012-04-24: qty 2
  Filled 2012-04-24: qty 3
  Filled 2012-04-24 (×3): qty 2
  Filled 2012-04-24 (×7): qty 3
  Filled 2012-04-24 (×2): qty 2

## 2012-04-24 MED ORDER — LIDOCAINE HCL 4 % MT SOLN
OROMUCOSAL | Status: DC | PRN
  Administered 2012-04-24: 4 mL via TOPICAL

## 2012-04-24 MED ORDER — IOHEXOL 300 MG/ML  SOLN
100.0000 mL | Freq: Once | INTRAMUSCULAR | Status: AC | PRN
  Administered 2012-04-24: 100 mL via INTRAVENOUS

## 2012-04-24 MED ORDER — PROPOFOL 10 MG/ML IV EMUL
INTRAVENOUS | Status: DC | PRN
  Administered 2012-04-24: 200 mg via INTRAVENOUS

## 2012-04-24 MED ORDER — SODIUM CHLORIDE 0.9 % IV SOLN
INTRAVENOUS | Status: DC
  Administered 2012-04-25 – 2012-05-05 (×14): via INTRAVENOUS

## 2012-04-24 MED ORDER — LACTATED RINGERS IV SOLN
INTRAVENOUS | Status: DC | PRN
  Administered 2012-04-24: 19:00:00 via INTRAVENOUS

## 2012-04-24 MED ORDER — ROCURONIUM BROMIDE 100 MG/10ML IV SOLN
INTRAVENOUS | Status: DC | PRN
  Administered 2012-04-24: 30 mg via INTRAVENOUS

## 2012-04-24 MED ORDER — LIDOCAINE HCL (CARDIAC) 20 MG/ML IV SOLN
INTRAVENOUS | Status: DC | PRN
  Administered 2012-04-24: 70 mg via INTRAVENOUS

## 2012-04-24 MED ORDER — IOHEXOL 300 MG/ML  SOLN: 100.0000 mL | Freq: Once | INTRAMUSCULAR | Status: DC | PRN

## 2012-04-24 MED ORDER — MORPHINE SULFATE 2 MG/ML IJ SOLN
2.0000 mg | Freq: Once | INTRAMUSCULAR | Status: AC
  Administered 2012-04-25: 2 mg via INTRAVENOUS
  Filled 2012-04-24: qty 1

## 2012-04-24 MED ORDER — ONDANSETRON HCL 4 MG/2ML IJ SOLN: 4.0000 mg | Freq: Once | INTRAMUSCULAR | Status: DC | PRN

## 2012-04-24 MED ORDER — HYDROMORPHONE HCL PF 1 MG/ML IJ SOLN
INTRAMUSCULAR | Status: AC
  Filled 2012-04-24: qty 1

## 2012-04-24 MED ORDER — NEOSTIGMINE METHYLSULFATE 1 MG/ML IJ SOLN
INTRAMUSCULAR | Status: DC | PRN
  Administered 2012-04-24: 2 mg via INTRAVENOUS

## 2012-04-24 SURGICAL SUPPLY — 52 items
AIRSTRIP 4 3/4X3 1/4 7185 (GAUZE/BANDAGES/DRESSINGS) IMPLANT
BANDAGE CONFORM 3  STR LF (GAUZE/BANDAGES/DRESSINGS) ×3 IMPLANT
BANDAGE ELASTIC 3 VELCRO ST LF (GAUZE/BANDAGES/DRESSINGS) ×3 IMPLANT
BANDAGE ELASTIC 4 VELCRO ST LF (GAUZE/BANDAGES/DRESSINGS) ×3 IMPLANT
BANDAGE GAUZE ELAST BULKY 4 IN (GAUZE/BANDAGES/DRESSINGS) ×6 IMPLANT
CANISTER SUCTION 2500CC (MISCELLANEOUS) IMPLANT
CLEANER TIP ELECTROSURG 2X2 (MISCELLANEOUS) ×3 IMPLANT
CLOTH BEACON ORANGE TIMEOUT ST (SAFETY) ×3 IMPLANT
CONT SPEC 4OZ CLIKSEAL STRL BL (MISCELLANEOUS) ×3 IMPLANT
COVER SURGICAL LIGHT HANDLE (MISCELLANEOUS) ×3 IMPLANT
CRADLE DONUT ADULT HEAD (MISCELLANEOUS) ×3 IMPLANT
DRAIN PENROSE 1/4X12 LTX STRL (WOUND CARE) IMPLANT
DRSG EMULSION OIL 3X3 NADH (GAUZE/BANDAGES/DRESSINGS) IMPLANT
ELECT COATED BLADE 2.86 ST (ELECTRODE) ×6 IMPLANT
ELECT REM PT RETURN 9FT ADLT (ELECTROSURGICAL) ×3
ELECTRODE REM PT RTRN 9FT ADLT (ELECTROSURGICAL) ×2 IMPLANT
GAUZE XEROFORM 5X9 LF (GAUZE/BANDAGES/DRESSINGS) ×3 IMPLANT
GLOVE ECLIPSE 8.0 STRL XLNG CF (GLOVE) ×6 IMPLANT
GOWN PREVENTION PLUS XLARGE (GOWN DISPOSABLE) ×9 IMPLANT
GOWN STRL NON-REIN LRG LVL3 (GOWN DISPOSABLE) ×3 IMPLANT
KIT BASIN OR (CUSTOM PROCEDURE TRAY) ×6 IMPLANT
KIT ROOM TURNOVER OR (KITS) ×3 IMPLANT
LOCATOR NERVE 3 VOLT (DISPOSABLE) IMPLANT
NEEDLE FILTER BLUNT 18X 1/2SAF (NEEDLE)
NEEDLE FILTER BLUNT 18X1 1/2 (NEEDLE) IMPLANT
NEEDLE HYPO 25GX1X1/2 BEV (NEEDLE) IMPLANT
NS IRRIG 1000ML POUR BTL (IV SOLUTION) ×6 IMPLANT
PACK ORTHO EXTREMITY (CUSTOM PROCEDURE TRAY) ×3 IMPLANT
PAD ARMBOARD 7.5X6 YLW CONV (MISCELLANEOUS) ×6 IMPLANT
PAD CAST 4YDX4 CTTN HI CHSV (CAST SUPPLIES) ×2 IMPLANT
PADDING CAST COTTON 4X4 STRL (CAST SUPPLIES) ×1
PENCIL BUTTON HOLSTER BLD 10FT (ELECTRODE) ×3 IMPLANT
RUBBERBAND STERILE (MISCELLANEOUS) IMPLANT
SPECIMEN JAR SMALL (MISCELLANEOUS) ×3 IMPLANT
SPLINT FIBERGLASS 3X35 (CAST SUPPLIES) ×3 IMPLANT
SPONGE GAUZE 4X4 12PLY (GAUZE/BANDAGES/DRESSINGS) ×3 IMPLANT
STAPLER VISISTAT 35W (STAPLE) IMPLANT
SUCTION FRAZIER TIP 10 FR DISP (SUCTIONS) ×6 IMPLANT
SUT CHROMIC 3 0 PS 2 (SUTURE) IMPLANT
SUT CHROMIC 4 0 P 3 18 (SUTURE) IMPLANT
SUT ETHILON 6 0 P 1 (SUTURE) IMPLANT
SUT SILK 3 0 (SUTURE)
SUT SILK 3-0 18XBRD TIE 12 (SUTURE) IMPLANT
SWAB COLLECTION DEVICE MRSA (MISCELLANEOUS) ×3 IMPLANT
SYR TB 1ML LUER SLIP (SYRINGE) IMPLANT
TOWEL OR 17X24 6PK STRL BLUE (TOWEL DISPOSABLE) ×3 IMPLANT
TOWEL OR 17X26 10 PK STRL BLUE (TOWEL DISPOSABLE) ×9 IMPLANT
TRAY ENT MC OR (CUSTOM PROCEDURE TRAY) ×3 IMPLANT
TUBE ANAEROBIC SPECIMEN COL (MISCELLANEOUS) ×3 IMPLANT
TUBE CONNECTING 12X1/4 (SUCTIONS) ×6 IMPLANT
WATER STERILE IRR 1000ML POUR (IV SOLUTION) IMPLANT
YANKAUER SUCT BULB TIP NO VENT (SUCTIONS) ×9 IMPLANT

## 2012-04-24 NOTE — Progress Notes (Signed)
Occupational Therapy Treatment Patient Details Name: [name] MRN: [id number] DOB: [birth year] Today's Date: 04/24/2012 Time: 4098-1191 OT Time Calculation (min): 15 min  OT Assessment / Plan / Recommendation Comments on Treatment Session Increased AROM in forearm supination; however pt still not working fingers as I have instructed him to.    Follow Up Recommendations  Home health OT    Equipment Recommendations  3 in 1 bedside comode    Frequency Min 3X/week   Plan Discharge plan remains appropriate    Precautions / Restrictions Precautions Precautions: None Restrictions Weight Bearing Restrictions: Yes LUE Weight Bearing: Weight bear through elbow only   Pertinent Vitals/Pain 10/10 LUE even when pre-medicated    ADL       OT Goals Arm Goals Arm Goal: PROM - Progress: Progressing toward goal (Still with pain(digits and forearm), 10 reps) Arm Goal: Additional Goal #1 - Progress: Progressing toward goals (Not working fingers as I have instructed)  Visit Information  Last OT Received On: 04/24/12 Assistance Needed: +1    Subjective Data  Subjective: I haven't been doing my fingers much this dressing is too bulky   Prior Functioning       Cognition  Overall Cognitive Status: Appears within functional limits for tasks assessed/performed Area of Impairment: Safety/judgement Arousal/Alertness: Awake/alert Behavior During Session: [facility] for tasks performed    Mobility     Exercises Other Exercises Other Exercises: Pt able to show me 2 of 4 exercises that I have shown him over the past three sessions that I have had with him (elbow flexion and extension and forearm supination/pronation). Did say that he had not been doing the composite finger flex/ext due the bandage being too bulky; however when I did them with him, he is able to do them within constraints of the bandages. Did not  remember shoulder flex/ext.  Elbow extension still tiight at end range, can get full  range if he lefts gravity assist with arm coming out over bed. Forearm supination is from full pronation to neutral actively. Still with decreased AROM and tolerance of PROM of digits. Pt did 10 reps of each exercise, with encouragement to continue to do them on his own.  Balance    End of Session OT - End of Session Activity Tolerance: Patient tolerated treatment well Patient left: in bed;with call bell/phone within reach   Evette Georges [phone] 04/24/2012, 11:10 AM

## 2012-04-24 NOTE — Progress Notes (Signed)
INFECTIOUS DISEASE PROGRESS NOTE  ID: [name] is a 51 y.o. male with   Active Problems:  Cellulitis and abscess of hand - LEFT  Bacteremia due to Group A Streptococcus  Neck abscess with extension into right chest wall and mediastinum  Psoas abscess, left  Leukocytosis  Transaminitis- ? SHOCK LIVER  SIRS (systemic inflammatory response syndrome)  Pleural effusion, bilateral with compressive atelectasis  Anemia- ? hemodilution  Hypokalemia  Acute respiratory failure with hypoxia  Right ankle swelling with pain and erythema  Subjective: Without complaints, no rashes, no diarrhea from anbx  Abtx:  Anti-infectives     Start     Dose/Rate Route Frequency Ordered Stop   04/17/12 1600   clindamycin (CLEOCIN) IVPB 900 mg  Status:  Discontinued        900 mg 100 mL/hr over 30 Minutes Intravenous Every 8 hours 04/17/12 1454 04/21/12 1249   04/17/12 1600   Ampicillin-Sulbactam (UNASYN) 3 g in sodium chloride 0.9 % 100 mL IVPB        3 g 100 mL/hr over 60 Minutes Intravenous Every 6 hours 04/17/12 1455     04/14/12 1400   ceFAZolin (ANCEF) IVPB 1 g/50 mL premix  Status:  Discontinued        1 g 100 mL/hr over 30 Minutes Intravenous 3 times per day 04/14/12 1057 04/17/12 1426   04/13/12 1900   vancomycin (VANCOCIN) 500 mg in sodium chloride 0.9 % 100 mL IVPB  Status:  Discontinued        500 mg 100 mL/hr over 60 Minutes Intravenous Every 12 hours 04/13/12 1601 04/14/12 1058   04/13/12 0815   moxifloxacin (AVELOX) IVPB 400 mg        400 mg 250 mL/hr over 60 Minutes Intravenous  Once 04/13/12 0805 04/13/12 0953   04/13/12 0500   vancomycin (VANCOCIN) IVPB 1000 mg/200 mL premix  Status:  Discontinued        1,000 mg 200 mL/hr over 60 Minutes Intravenous Every 12 hours 04/13/12 0453 04/13/12 1414   04/13/12 0445   vancomycin (VANCOCIN) IVPB 1000 mg/200 mL premix        1,000 mg 200 mL/hr over 60 Minutes Intravenous  Once 04/13/12 0442 04/13/12 0607          Medications:   Scheduled:   . ampicillin-sulbactam (UNASYN) IV  3 g Intravenous Q6H  . enoxaparin (LOVENOX) injection  40 mg Subcutaneous Q24H  . mupirocin ointment   Topical BID  . nystatin  10 mL Oral QID  . predniSONE  40 mg Oral Q breakfast  . sodium chloride  3 mL Intravenous Q12H    Objective: Vital signs in last 24 hours: Temp:  [98.2 F (36.8 C)-98.6 F (37 C)] 98.6 F (37 C) (05/01 0526) Pulse Rate:  [93-94] 93  (05/01 0526) Resp:  [18] 18  (05/01 0526) BP: (139-141)/(67-77) 139/67 mmHg (05/01 0526) SpO2:  [93 %-96 %] 93 % (05/01 0526)   General appearance: alert, cooperative and no distress Neck: dressing in place Resp: clear to auscultation bilaterally Cardio: regular rate and rhythm GI: normal findings: bowel sounds normal and soft, non-tender Extremities: LUE at wrist/hand is wrapped. R ankle is warm but no effusion or tenderness  Lab Results  Basename 04/24/12 0515 04/23/12 0522  WBC 9.4 9.8  HGB 8.9* 8.7*  HCT 27.7* 26.7*  NA 135 135  K 3.7 3.6  CL 100 99  CO2 24 25  BUN 7 7  CREATININE 0.59 0.63  GLU -- --  Liver Panel  Basename 04/24/12 0515 04/23/12 0522  PROT 7.3 7.0  ALBUMIN 1.7* 1.6*  AST 133* 133*  ALT 85* 72*  ALKPHOS 144* 133*  BILITOT 0.7 0.8  BILIDIR -- --  IBILI -- --   Sedimentation Rate No results found for this basename: ESRSEDRATE in the last 72 hours C-Reactive Protein No results found for this basename: CRP:2 in the last 72 hours  Microbiology: Recent Results (from the past 240 hour(s))  CULTURE, BLOOD (ROUTINE X 2)     Status: Normal   Collection Time   04/17/12  4:00 PM      Component Value Range Status Comment   Specimen Description BLOOD RIGHT HAND   Final    Special Requests BOTTLES DRAWN AEROBIC AND ANAEROBIC 10CC   Final    Culture  Setup Time [id number]   Final    Culture NO GROWTH 5 DAYS   Final    Report Status 04/24/2012 FINAL   Final   CULTURE, BLOOD (ROUTINE X 2)     Status: Normal   Collection Time   04/17/12   4:05 PM      Component Value Range Status Comment   Specimen Description BLOOD RIGHT HAND   Final    Special Requests BOTTLES DRAWN AEROBIC AND ANAEROBIC 10CC   Final    Culture  Setup Time [id number]   Final    Culture NO GROWTH 5 DAYS   Final    Report Status 04/24/2012 FINAL   Final   SURGICAL PCR SCREEN     Status: Normal   Collection Time   04/17/12  9:32 PM      Component Value Range Status Comment   MRSA, PCR NEGATIVE  NEGATIVE  Final    Staphylococcus aureus NEGATIVE  NEGATIVE  Final   GRAM STAIN     Status: Normal   Collection Time   04/17/12 11:48 PM      Component Value Range Status Comment   Specimen Description ABSCESS NECK RIGHT   Final    Special Requests PATIENT ON FOLLOWING UNASYN   Final    Gram Stain     Final    Value: FEW WBC PRESENT, PREDOMINANTLY PMN CORRECTED ON 04/25 AT 1554: PREVIOUSLY REPORTED AS NO WBC SEEN     RARE GRAM POSITIVE COCCI IN PAIRS IN CHAINS CORRECTED ON 04/25 AT 1555: PREVIOUSLY REPORTED AS NO ORGANISMS SEEN     CALLED [facility] Mendel Corning RN 04/18/12 0040 ACAINJ     REPORT CORRECTED AND CALLED TO BETH BRADT, RN 04/18/12 1555 BY K SCHULTZ   Report Status 04/18/2012 FINAL   Final   ANAEROBIC CULTURE     Status: Normal   Collection Time   04/17/12 11:48 PM      Component Value Range Status Comment   Specimen Description ABSCESS NECK RIGHT   Final    Special Requests PATIENT ON FOLLOWING UNASYN   Final    Gram Stain     Final    Value: FEW WBC PRESENT, PREDOMINANTLY PMN     RARE GRAM POSITIVE COCCI     IN PAIRS IN CHAINS   Culture NO ANAEROBES ISOLATED   Final    Report Status 04/22/2012 FINAL   Final   CULTURE, ROUTINE-ABSCESS     Status: Normal   Collection Time   04/17/12 11:48 PM      Component Value Range Status Comment   Specimen Description ABSCESS NECK RIGHT   Final    Special Requests PATIENT ON  FOLLOWING UNASYN   Final    Gram Stain     Final    Value: FEW WBC PRESENT, PREDOMINANTLY PMN     RARE GRAM POSITIVE COCCI     IN PAIRS IN  CHAINS Performed at [facility] Gram Stain Report Called to,Read Back By and Verified With: Gram Stain Report Called to,Read Back By and Verified With: [facility] JOHNSON J RN 04/18/12 0040 ACAINJ Gram Stain previously reported as No WBC seen      and No Organisms seen CORRECTED RESULTS CALLED TO: KIM S AT [facility] MICRO [id number] BY WOODB   Culture RARE GROUP A STREP (S.PYOGENES) ISOLATED   Final    Report Status 04/20/2012 FINAL   Final   CULTURE, ROUTINE-ABSCESS     Status: Normal   Collection Time   04/18/12  9:19 PM      Component Value Range Status Comment   Specimen Description ABSCESS GROIN LEFT   Final    Special Requests PSOAS ASPIRATION   Final    Gram Stain     Final    Value: MODERATE WBC PRESENT,BOTH PMN AND MONONUCLEAR     NO SQUAMOUS EPITHELIAL CELLS SEEN     RARE GRAM POSITIVE COCCI IN PAIRS   Culture NO GROWTH 3 DAYS   Final    Report Status 04/22/2012 FINAL   Final     Studies/Results: Ct Soft Tissue Neck W Contrast  04/24/2012  *RADIOLOGY REPORT*  Clinical Data: 51 year old male with history of right neck and parasternal abscess. Status post operative exploration and drainage. Additional acute infections at other site (extremities).  CT NECK WITH CONTRAST  Technique:  Multidetector CT imaging of the neck was performed with intravenous contrast.  Contrast: OMNIPAQUE IOHEXOL 300 MG/ML  SOLN  Comparison: Preoperative study 04/17/2012.  Findings: Postoperative changes with skin incision at the right supraclavicular neck.  Interval increased obscuration of normal fat planes in the around the right sternocleidomastoid and right pectoralis muscles.  Decreased but residual rim enhancing fluid collection, epicenter between the right lobe of the thyroid and right clavicle.  The right sternoclavicular joint remains involved (series 6 image 98).  There remains a subtle rim enhancing collection deep to the pectoralis muscle near the anterior first and second rib ends (image 97).  There  remains a small volume of gas associated with these collections.  The rim enhancing component of the collection tracking cephalad along the deep sternocleidomastoid muscle on the right has mildly progressed.  Additional small gas foci more distally near the right shoulder may be incidental intravenous tear (the right extremity IV access and contrast injection for this exam.  Chest findings are reported separately.  The thyroid remains within normal limits except for mass effect on the right lobe.  Major vascular structures including the right internal jugular vein, right carotid and vertebral arteries remain patent.  Fluid in the retropharyngeal space has decreased.  Parapharyngeal and sublingual spaces remain normal.  Submandibular and parotid glands remain normal.  Pharynx and the larynx contours remain within normal limits.  Visualized orbit soft tissues are within normal limits.  Negative visualized brain parenchyma.  Minimal right sphenoid sinus mucosal thickening.  No acute osseous findings in the face, skull base or cervical spine.  IMPRESSION: 1.  Trans-spatial abscess centered at the right thoracic inlet has modestly decreased following surgery, but significant disease persists - including tracking  along the deep margins of the right pectoralis and right sternocleidomastoid muscles, and associated with the right sternoclavicular  joint.  The involvement along the right SCM has mildly progressed. 2.  Case reviewed in person with Dr. Lazarus Salines at [phone] hours on 04/24/2012.  Original Report Authenticated By: Harley Hallmark, M.D.   Ct Chest W Contrast  04/24/2012  *RADIOLOGY REPORT*  Clinical Data: Abscess with extension into the chest.  CT CHEST WITH CONTRAST  Technique:  Multidetector CT imaging of the chest was performed following the standard protocol during bolus administration of intravenous contrast.  Contrast: OMNIPAQUE IOHEXOL 300 MG/ML  SOLN  Comparison: Chest CT 04/17/2012.  Findings:   Mediastinum: Heart size is normal. There is a small amount of pericardial fluid and/or thickening, with some mild pericardial enhancement, slightly increased compared to prior study 04/17/2012. The previously described collection of gas and fluid in the lower right sternocleidomastoid muscle extending to the sternoclavicular joint is again noted, appears slightly smaller than the prior examination.  Around the sternoclavicular joint there is some abnormal soft tissue thickening and enhancement, which encroaches upon the superior mediastinum.  No more inferior mediastinal extension of this process is identified on today's examination. No pathologically enlarged mediastinal or hilar lymph nodes. Esophagus is unremarkable in appearance.  A right upper extremity PICC is in place with tip terminating in the right atrium.  Lungs/Pleura: There are moderate right and small left-sided pleural effusions layering dependently, with associated passive atelectasis in the lower lobes of the lungs bilaterally.  No definite consolidative airspace disease.  No definite suspicious appearing pulmonary nodules or masses are identified.  Upper Abdomen: Visualized portions are unremarkable.  Musculoskeletal: Again noted is some extension of the abnormal enhancing fluid collection into the muscle belly of the right pectoralis major.  There is some small locules of gas within the right pectoralis major muscle, which may be indicative of gas- forming organisms, or recent surgical intervention. There are no aggressive appearing lytic or blastic lesions noted in the visualized portions of the skeleton.  IMPRESSION: 1.  Slight interval decrease in size of an abnormal enhancing gas containing fluid collection centered in the lower right sternocleidomastoid muscle around the right sternoclavicular joint with some extension of the right pectoralis muscle.  The at this time, there is some inflammatory changes that result in local mass effect around  the right sternoclavicular joint, without frank extension into the mediastinum. 2.  Moderate right and small left-sided pleural effusions layering dependently with associated dependent passive atelectasis in the lower lobes of the lungs bilaterally. 3.  Slight interval increase in the amount of pericardial fluid and/or thickening with pericardial enhancement compared to the prior study. These findings can be seen in the setting of pericarditis.  Clinical correlation is recommended.  Original Report Authenticated By: Florencia Reasons, M.D.     Assessment/Plan: Group A Strep bacteremia, metastatic foci (neck abscess, R sternocleidomastoid/mediastinum/ R ankle cellulitis/myofascitis, L forearm, L groin) Day 11 anbx (unasyn) Planning to go back to OR today. Will continue unasyn, watch...   Johny Sax Infectious Diseases [phone] 04/24/2012, 4:12 PM   LOS: 12 days

## 2012-04-24 NOTE — Progress Notes (Signed)
PT Cancellation Note  Treatment cancelled today due to patient's refusal to participate. Pt reports he would like to rest in preparation for his procedure this afternoon. Will f/u tomorrow.   [facility] HELEN 04/24/2012, 12:14 PM Pager: [phone]

## 2012-04-24 NOTE — Anesthesia Preprocedure Evaluation (Addendum)
Anesthesia Evaluation  Patient identified by MRN, date of birth, ID band Patient awake    Reviewed: Allergy & Precautions, H&P , NPO status   History of Anesthesia Complications Negative for: history of anesthetic complications  Airway Mallampati: I TM Distance: >3 FB Neck ROM: Full    Dental  (+) Teeth Intact and Dental Advisory Given   Pulmonary neg pulmonary ROS,  breath sounds clear to auscultation        Cardiovascular negative cardio ROS  Rhythm:Regular Rate:Normal     Neuro/Psych  Neuromuscular disease    GI/Hepatic negative GI ROS, Neg liver ROS,   Endo/Other  negative endocrine ROS  Renal/GU negative Renal ROS  negative genitourinary   Musculoskeletal negative musculoskeletal ROS (+)   Abdominal   Peds  Hematology negative hematology ROS (+)   Anesthesia Other Findings   Reproductive/Obstetrics                          Anesthesia Physical Anesthesia Plan  ASA: II  Anesthesia Plan: General   Post-op Pain Management:    Induction: Intravenous  Airway Management Planned: Oral ETT  Additional Equipment:   Intra-op Plan:   Post-operative Plan: Extubation in OR  Informed Consent: I have reviewed the patients History and Physical, chart, labs and discussed the procedure including the risks, benefits and alternatives for the proposed anesthesia with the patient or authorized representative who has indicated his/her understanding and acceptance.   Dental advisory given  Plan Discussed with: Anesthesiologist and Surgeon  Anesthesia Plan Comments:         Anesthesia Quick Evaluation

## 2012-04-24 NOTE — Preoperative (Signed)
Beta Blockers   Reason not to administer Beta Blockers:Not Applicable 

## 2012-04-24 NOTE — Progress Notes (Signed)
Call to dr Katrinka Blazing to report sbp in 180s since surgery while it was in 140 prior to surgery. No new orders.

## 2012-04-24 NOTE — Progress Notes (Signed)
Subjective: Anxious about plans for OR this p.m. Again. States right great toe pain better. Chart reviewed.  Objective: Vital signs in last 24 hours: Temp:  [98.2 F (36.8 C)-98.6 F (37 C)] 98.6 F (37 C) (05/01 0526) Pulse Rate:  [93-94] 93  (05/01 0526) Resp:  [18] 18  (05/01 0526) BP: (139-141)/(67-77) 139/67 mmHg (05/01 0526) SpO2:  [93 %-96 %] 93 % (05/01 0526) Weight change:  Last BM Date: 04/22/12  Intake/Output from previous day: 04/30 0701 - 05/01 0700 In: 1000 [P.O.:1000] Out: 2900 [Urine:2900] Total I/O In: 718 [P.O.:240; I.V.:478] Out: 400 [Urine:400]   Physical Exam: General: Comfortable, alert, communicative, fully oriented, not short of breath at rest.  NECK:  Supple, has dressings, which are clean and dry today. CHEST:  Clinically clear to auscultation, no wheezes, no crackles. HEART: normal S1,S2, regular, no murmurs. ABDOMEN:  Full, soft, non-tender, no palpable organomegaly, no palpable masses, normal bowel sounds. UPPER EXTREMITIES: Left hand and forearm are heavily bandaged and Ace wrapped.  LOWER EXTREMITIES:  No edema, palpable peripheral pulses. Dorsum of right foot is swollen, but right ankle is non-tender, and has full ROM. Right 1st MTP is tender, and has restricted ROM, secondary to pain. CENTRAL NERVOUS SYSTEM:  No focal neurologic deficit on gross examination.  Lab Results:  [facility] 04/24/12 0515 04/23/12 0522  WBC 9.4 9.8  HGB 8.9* 8.7*  HCT 27.7* 26.7*  PLT 601* 571*    Basename 04/24/12 0515 04/23/12 0522  NA 135 135  K 3.7 3.6  CL 100 99  CO2 24 25  GLUCOSE 121* 93  BUN 7 7  CREATININE 0.59 0.63  CALCIUM 8.4 8.4   Recent Results (from the past 240 hour(s))  CULTURE, BLOOD (ROUTINE X 2)     Status: Normal   Collection Time   04/17/12  4:00 PM      Component Value Range Status Comment   Specimen Description BLOOD RIGHT HAND   Final    Special Requests BOTTLES DRAWN AEROBIC AND ANAEROBIC 10CC   Final    Culture  Setup Time  [id number]   Final    Culture NO GROWTH 5 DAYS   Final    Report Status 04/24/2012 FINAL   Final   CULTURE, BLOOD (ROUTINE X 2)     Status: Normal   Collection Time   04/17/12  4:05 PM      Component Value Range Status Comment   Specimen Description BLOOD RIGHT HAND   Final    Special Requests BOTTLES DRAWN AEROBIC AND ANAEROBIC 10CC   Final    Culture  Setup Time [id number]   Final    Culture NO GROWTH 5 DAYS   Final    Report Status 04/24/2012 FINAL   Final   SURGICAL PCR SCREEN     Status: Normal   Collection Time   04/17/12  9:32 PM      Component Value Range Status Comment   MRSA, PCR NEGATIVE  NEGATIVE  Final    Staphylococcus aureus NEGATIVE  NEGATIVE  Final   GRAM STAIN     Status: Normal   Collection Time   04/17/12 11:48 PM      Component Value Range Status Comment   Specimen Description ABSCESS NECK RIGHT   Final    Special Requests PATIENT ON FOLLOWING UNASYN   Final    Gram Stain     Final    Value: FEW WBC PRESENT, PREDOMINANTLY PMN CORRECTED ON 04/25 AT 1554: PREVIOUSLY REPORTED AS NO  WBC SEEN     RARE GRAM POSITIVE COCCI IN PAIRS IN CHAINS CORRECTED ON 04/25 AT 1555: PREVIOUSLY REPORTED AS NO ORGANISMS SEEN     CALLED [facility] Mendel Corning RN 04/18/12 0040 ACAINJ     REPORT CORRECTED AND CALLED TO BETH BRADT, RN 04/18/12 1555 BY K SCHULTZ   Report Status 04/18/2012 FINAL   Final   ANAEROBIC CULTURE     Status: Normal   Collection Time   04/17/12 11:48 PM      Component Value Range Status Comment   Specimen Description ABSCESS NECK RIGHT   Final    Special Requests PATIENT ON FOLLOWING UNASYN   Final    Gram Stain     Final    Value: FEW WBC PRESENT, PREDOMINANTLY PMN     RARE GRAM POSITIVE COCCI     IN PAIRS IN CHAINS   Culture NO ANAEROBES ISOLATED   Final    Report Status 04/22/2012 FINAL   Final   CULTURE, ROUTINE-ABSCESS     Status: Normal   Collection Time   04/17/12 11:48 PM      Component Value Range Status Comment   Specimen Description ABSCESS NECK RIGHT    Final    Special Requests PATIENT ON FOLLOWING UNASYN   Final    Gram Stain     Final    Value: FEW WBC PRESENT, PREDOMINANTLY PMN     RARE GRAM POSITIVE COCCI     IN PAIRS IN CHAINS Performed at [facility] Gram Stain Report Called to,Read Back By and Verified With: Gram Stain Report Called to,Read Back By and Verified With: [facility] JOHNSON J RN 04/18/12 0040 ACAINJ Gram Stain previously reported as No WBC seen      and No Organisms seen CORRECTED RESULTS CALLED TO: KIM S AT [facility] MICRO [id number] BY WOODB   Culture RARE GROUP A STREP (S.PYOGENES) ISOLATED   Final    Report Status 04/20/2012 FINAL   Final   CULTURE, ROUTINE-ABSCESS     Status: Normal   Collection Time   04/18/12  9:19 PM      Component Value Range Status Comment   Specimen Description ABSCESS GROIN LEFT   Final    Special Requests PSOAS ASPIRATION   Final    Gram Stain     Final    Value: MODERATE WBC PRESENT,BOTH PMN AND MONONUCLEAR     NO SQUAMOUS EPITHELIAL CELLS SEEN     RARE GRAM POSITIVE COCCI IN PAIRS   Culture NO GROWTH 3 DAYS   Final    Report Status 04/22/2012 FINAL   Final      Studies/Results: Ct Soft Tissue Neck W Contrast  04/24/2012  *RADIOLOGY REPORT*  Clinical Data: 51 year old male with history of right neck and parasternal abscess. Status post operative exploration and drainage. Additional acute infections at other site (extremities).  CT NECK WITH CONTRAST  Technique:  Multidetector CT imaging of the neck was performed with intravenous contrast.  Contrast: OMNIPAQUE IOHEXOL 300 MG/ML  SOLN  Comparison: Preoperative study 04/17/2012.  Findings: Postoperative changes with skin incision at the right supraclavicular neck.  Interval increased obscuration of normal fat planes in the around the right sternocleidomastoid and right pectoralis muscles.  Decreased but residual rim enhancing fluid collection, epicenter between the right lobe of the thyroid and right clavicle.  The right sternoclavicular joint  remains involved (series 6 image 98).  There remains a subtle rim enhancing collection deep to the pectoralis muscle near the  anterior first and second rib ends (image 97).  There remains a small volume of gas associated with these collections.  The rim enhancing component of the collection tracking cephalad along the deep sternocleidomastoid muscle on the right has mildly progressed.  Additional small gas foci more distally near the right shoulder may be incidental intravenous tear (the right extremity IV access and contrast injection for this exam.  Chest findings are reported separately.  The thyroid remains within normal limits except for mass effect on the right lobe.  Major vascular structures including the right internal jugular vein, right carotid and vertebral arteries remain patent.  Fluid in the retropharyngeal space has decreased.  Parapharyngeal and sublingual spaces remain normal.  Submandibular and parotid glands remain normal.  Pharynx and the larynx contours remain within normal limits.  Visualized orbit soft tissues are within normal limits.  Negative visualized brain parenchyma.  Minimal right sphenoid sinus mucosal thickening.  No acute osseous findings in the face, skull base or cervical spine.  IMPRESSION: 1.  Trans-spatial abscess centered at the right thoracic inlet has modestly decreased following surgery, but significant disease persists - including tracking  along the deep margins of the right pectoralis and right sternocleidomastoid muscles, and associated with the right sternoclavicular joint.  The involvement along the right SCM has mildly progressed. 2.  Case reviewed in person with Dr. Lazarus Salines at [phone] hours on 04/24/2012.  Original Report Authenticated By: Harley Hallmark, M.D.   Ct Chest W Contrast  04/24/2012  *RADIOLOGY REPORT*  Clinical Data: Abscess with extension into the chest.  CT CHEST WITH CONTRAST  Technique:  Multidetector CT imaging of the chest was performed following  the standard protocol during bolus administration of intravenous contrast.  Contrast: OMNIPAQUE IOHEXOL 300 MG/ML  SOLN  Comparison: Chest CT 04/17/2012.  Findings:  Mediastinum: Heart size is normal. There is a small amount of pericardial fluid and/or thickening, with some mild pericardial enhancement, slightly increased compared to prior study 04/17/2012. The previously described collection of gas and fluid in the lower right sternocleidomastoid muscle extending to the sternoclavicular joint is again noted, appears slightly smaller than the prior examination.  Around the sternoclavicular joint there is some abnormal soft tissue thickening and enhancement, which encroaches upon the superior mediastinum.  No more inferior mediastinal extension of this process is identified on today's examination. No pathologically enlarged mediastinal or hilar lymph nodes. Esophagus is unremarkable in appearance.  A right upper extremity PICC is in place with tip terminating in the right atrium.  Lungs/Pleura: There are moderate right and small left-sided pleural effusions layering dependently, with associated passive atelectasis in the lower lobes of the lungs bilaterally.  No definite consolidative airspace disease.  No definite suspicious appearing pulmonary nodules or masses are identified.  Upper Abdomen: Visualized portions are unremarkable.  Musculoskeletal: Again noted is some extension of the abnormal enhancing fluid collection into the muscle belly of the right pectoralis major.  There is some small locules of gas within the right pectoralis major muscle, which may be indicative of gas- forming organisms, or recent surgical intervention. There are no aggressive appearing lytic or blastic lesions noted in the visualized portions of the skeleton.  IMPRESSION: 1.  Slight interval decrease in size of an abnormal enhancing gas containing fluid collection centered in the lower right sternocleidomastoid muscle around the  right sternoclavicular joint with some extension of the right pectoralis muscle.  The at this time, there is some inflammatory changes that result in local mass effect  around the right sternoclavicular joint, without frank extension into the mediastinum. 2.  Moderate right and small left-sided pleural effusions layering dependently with associated dependent passive atelectasis in the lower lobes of the lungs bilaterally. 3.  Slight interval increase in the amount of pericardial fluid and/or thickening with pericardial enhancement compared to the prior study. These findings can be seen in the setting of pericarditis.  Clinical correlation is recommended.  Original Report Authenticated By: Florencia Reasons, M.D.    Medications: Scheduled Meds:    . ampicillin-sulbactam (UNASYN) IV  3 g Intravenous Q6H  . enoxaparin (LOVENOX) injection  40 mg Subcutaneous Q24H  . mupirocin ointment   Topical BID  . nystatin  10 mL Oral QID  . predniSONE  40 mg Oral Q breakfast  . sodium chloride  3 mL Intravenous Q12H   Continuous Infusions:    . sodium chloride 100 mL/hr at 04/24/12 1028   PRN Meds:.acetaminophen, iohexol, iohexol, morphine injection, ondansetron (ZOFRAN) IV, ondansetron, phenol, sodium chloride  Assessment/Plan:  Active Problems:  1. Bacteremia due to Group A Streptococcus/ Sepsis: Patient had an upper respiratory infection on April 13 for which he sought medical care but did not receive antibiotics and it is presumed he was treated for a viral upper respiratory infection. He failed to improve, subsequently developed more constitutional symptoms and presented to our hospital with current condition. Septic work up revealed Group A streptococcal bacteremia, with multiple metastatic infective foci. ID consultation was provided by Dr Acey Lav, who was very helpful in rationalizing antibiotic therapy. Patient is currently on Unasyn but will be transitioned to Rocephin monotherapy on  discharge. Duration is unclear at this time. 2D Echocardiogram of 04/18/12, showed normal left ventricular cavity size, mild concentric hypertrophy and ejection fraction of 60% to 65%, with no regional wall motion abnormalities. A small to moderate pericardial effusion was identified circumferential to the heart and no vegetations were identified. Dr Dietrich Pates, [place] cardiologist, reviewed the echocardiogram and  in her opinion the effusion is actually small and no tamponade physiology is seen.  -Appreciate ID assistance with antibiotic recommendations. 2. Multiple septic metastatic foci/abscesses and cellulitis: Patient presented with a swollen left hand and arm, as we;ll as generalized pains. Evaluation, revealed multiple foci of infection, including neck abscess with extension into right chest wall/Sterno-clavicular joint and mediastinum, cellulitis and abscess of Left hand/Left Psoas abscess/ Right ankle and thigh swelling with pain and erythema- ? Evolving abscess. ENT consult was provided by Dr Flo Shanks, who performed darinage of neck/mediastinal abscesses, Dr Bradly Bienenstock carried out debridement of LUE and aspiration of right ankle on 04/18/12. Aspiration of left  iliopsoas abscess was performed by Dr Oley Balm,  interventional radiologist, on the same date. Patient had a transaminitis, likely due to sepsis, but this is improving with treatment.   -repeat neck CT this am revealed loculations in low RIGHT neck, intramuscular RIGHT SCM, sub pectoral accumulations Per Dr. Lazarus Salines team and the patient to be taken to or this pm. -Continuing antibiotics per ID as above 3. Acute respiratory failure with hypoxia/ Pleural effusion, bilateral with compressive atelectasis:  Patient had transient hypoxia, soon after hospitalization and procedures. This was felt multifactorial, secondary to pleural effusions and compressive atelectasis,as well a s oioid analgesics. He was deemed at risk for developing  HCAP, but recovered with incentive spirometry and oxygen supplementation. 4. Thrush: An incidental finding. Improving with Nystatin swish and swallow.  5. Anemia:   Patient had a mild microcytic anemia, with Hemoglobin  of 11.2 and MCV of 67.1 at presentation. During this hospitalization, hemoglobin has dropped, possibly because of added critical illness anemia. Hematinic studies revealed iron deficiency. -hemoglobin stable, may need outpatient GI workup 6. Gout: Patient has a history of gout, and today, had right 1st MTP joint swelling 4/30, redness and pain.  -continue a 5-day course of oral Prednisone. -improving     LOS: 12 days   [name] C 04/24/2012, 3:54 PM

## 2012-04-24 NOTE — Brief Op Note (Signed)
04/12/2012 - 04/24/2012  7:46 PM  PATIENT:  [name]  50 y.o. male  PRE-OPERATIVE DIAGNOSIS:  Right Chest/Neck Abscess  POST-OPERATIVE DIAGNOSIS:  * No post-op diagnosis entered *  PROCEDURE:  Procedure(s) (LRB): LYMPH NODE BIOPSY (Right) IRRIGATION AND DEBRIDEMENT EXTREMITY (Left)  SURGEON:  Surgeon(s) and Role: Panel 1:    * Flo Shanks, MD - Primary  Panel 2:    * Sharma Covert, MD - Primary  PHYSICIAN ASSISTANT:   ASSISTANTS: none   ANESTHESIA:   general  EBL:  Total I/O In: -  Out: 20 [Blood:20]  BLOOD ADMINISTERED:none  DRAINS: none   LOCAL MEDICATIONS USED:  NONE  SPECIMEN:  No Specimen  DISPOSITION OF SPECIMEN:  N/A  COUNTS:  YES  TOURNIQUET:  * No tourniquets in log *  DICTATION: .Other Dictation: Dictation Number [id number]  PLAN OF CARE: Admit to inpatient   PATIENT DISPOSITION:  PACU - hemodynamically stable.   Delay start of Pharmacological VTE agent (>24hrs) due to surgical blood loss or risk of bleeding: not applicable

## 2012-04-24 NOTE — Op Note (Signed)
      Op Note 04/18/2012 12:28 AM  04/24/12 2000  Regina, Jayanth  [id number]   Pre-Op Dx: Deep RIGHT neck infection with extension onto RIGHT chest wall and upper mediastinum   Post-op Dx: same   Proc:  Exploration/I&D RIGHT lower neck, mediastinum, chest wall   Surg: Flo Shanks T MD   Anes: GOT   EBL: 100 ml   Comp: none   Findings: Proteinaceous eschar along SCM/great vessel sheath.  Loculations superficial and deep to the sternal manubrium.  Loculations under pec major down onto rib cage.  Loculations tracking under pec major towards shoulder, and from the root of the neck down along the subclavian vessels towards the shoulder. Deep tracking inside SCM muscle.  Procedure: With the patient in a comfortable supine position, GOT anesthesia was induced without difficulty.   At an appropriate level, the patient was extended over a shoulder roll with the head supported and rotated to the left for access to the right neck. The neck was palpated with the findings as described above. A sterile preparation and draping of the right neck was accomplished in the standard fashion.  Sutures were removed and the old incision was opened bluntly.  Using predominantly blunt finger dissection, a tract between the sternomastoid muscle and the great vessels sheath was explored superiorly. A groove containing proteinaceous eschar within the body of the sternomastoid muscle was also explored superiorly. Blunt dissection was carried inferiorly across the sternal notch onto the anterior chest wall, more laterally behind the clavicle and down into the scalene triangle and even along the subclavian vessels, and finally bluntly under the chest wall skin over the pectoralis major muscle, and under the pectoralis major muscle which was previously elevated last week. Loculations were followed along the clavicle laterally and down to the rib cage. This material was evacuated directly and with a irrigation and sent  for specimen. No gross purulent loculations were encountered for additional cultures. Hemostasis was observed.  Entire wound was thoroughly irrigated with approximately 500 cc of sterile saline.  Three-inch Kling gauze was saturated with saline and was packed into all the open explored areas. 3 separate lengths were used. Each was identified at the skin level with a sterile safety pin which was then sewn to the chest wall and neck skin with a 4-0 nylon stitch. The wound was closed minimally at either end with a vertical mattress stitch of 4-0 nylon. Again hemostasis was observed.  At this point, 4 inch Kerlix was used to support the wound externally and then wrapped around the neck.  At this point, the procedure was turned over to Dr. Melvyn Novas for debridement of the left hand.  At the completion of his procedure, the patient was returned to anesthesia, awakened, extubated, and transferred to recovery in stable condition.   Dispo: PACU to 5100 Plan: IV antibiosis.  We'll begin advancing packing when the drainage is reduced.  We will continue with Unasyn for the time being. Infectious disease will continue to assist.   Cephus Richer MD

## 2012-04-24 NOTE — H&P (Signed)
04/24/2012 8:42 AM  [name], [name] [id number]  Post-Op Day 7    Temp:  [98.2 F (36.8 C)-102 F (38.9 C)] 98.6 F (37 C) (05/01 0526) Pulse Rate:  [93-111] 93  (05/01 0526) Resp:  [18-20] 18  (05/01 0526) BP: (139-154)/(67-82) 139/67 mmHg (05/01 0526) SpO2:  [93 %-96 %] 93 % (05/01 0526),     Intake/Output Summary (Last 24 hours) at 04/24/12 0842 Last data filed at 04/24/12 0500  Gross per 24 hour  Intake   1000 ml  Output   2275 ml  Net  -1275 ml    Results for orders placed during the hospital encounter of 04/12/12 (from the past 24 hour(s))  CBC     Status: Abnormal   Collection Time   04/24/12  5:15 AM      Component Value Range   WBC 9.4  4.0 - 10.5 (K/uL)   RBC 4.12 (*) 4.22 - 5.81 (MIL/uL)   Hemoglobin 8.9 (*) 13.0 - 17.0 (g/dL)   HCT 40.9 (*) 81.1 - 52.0 (%)   MCV 67.2 (*) 78.0 - 100.0 (fL)   MCH 21.6 (*) 26.0 - 34.0 (pg)   MCHC 32.1  30.0 - 36.0 (g/dL)   RDW 91.4  78.2 - 95.6 (%)   Platelets 601 (*) 150 - 400 (K/uL)  COMPREHENSIVE METABOLIC PANEL     Status: Abnormal   Collection Time   04/24/12  5:15 AM      Component Value Range   Sodium 135  135 - 145 (mEq/L)   Potassium 3.7  3.5 - 5.1 (mEq/L)   Chloride 100  96 - 112 (mEq/L)   CO2 24  19 - 32 (mEq/L)   Glucose, Bld 121 (*) 70 - 99 (mg/dL)   BUN 7  6 - 23 (mg/dL)   Creatinine, Ser 2.13  0.50 - 1.35 (mg/dL)   Calcium 8.4  8.4 - 08.6 (mg/dL)   Total Protein 7.3  6.0 - 8.3 (g/dL)   Albumin 1.7 (*) 3.5 - 5.2 (g/dL)   AST 578 (*) 0 - 37 (U/L)   ALT 85 (*) 0 - 53 (U/L)   Alkaline Phosphatase 144 (*) 39 - 117 (U/L)   Total Bilirubin 0.7  0.3 - 1.2 (mg/dL)   GFR calc non Af Amer >90  >90 (mL/min)   GFR calc Af Amer >90  >90 (mL/min)   CT Neck/Chest this AM shows loculations in low RIGHT neck, intramuscular RIGHT SCM, sub pectoral accumulations.  SUBJECTIVE:  Some mid neck pain.  Breathing/swallowing OK.   OBJECTIVE:  Tender RIGHT mid neck, no overlying skin changes.  Min drainage on  dressing.  IMPRESSION:  Fever, residual and additional loculations, RIGHT neck.  Probable progression RIGHT deep neck infection.  PLAN:  Npo for now.  To OR later today for re-exploration RIGHT neck, upper chest wall.  Discussed with patient.  Informed consent obtained.  Will review CT scans with Radiologists.  Flo Shanks

## 2012-04-24 NOTE — Anesthesia Postprocedure Evaluation (Signed)
  Anesthesia Post-op Note  Patient: [name]  Procedure(s) Performed: Procedure(s) (LRB): LYMPH NODE BIOPSY (Right) IRRIGATION AND DEBRIDEMENT EXTREMITY (Left)  Patient Location: PACU  Anesthesia Type: General  Level of Consciousness: awake, oriented and patient cooperative  Airway and Oxygen Therapy: Patient Spontanous Breathing and Patient connected to nasal cannula oxygen  Post-op Pain: mild  Post-op Assessment: Post-op Vital signs reviewed, Patient's Cardiovascular Status Stable, Respiratory Function Stable, Patent Airway, No signs of Nausea or vomiting and Pain level controlled  Post-op Vital Signs: stable  Complications: No apparent anesthesia complications

## 2012-04-24 NOTE — Progress Notes (Signed)
Pt seen/examined in holding area Will change dressing during neck procedure Spoke with patient and family and if it does not look better will repeat i/d of wrist Pt voiced understanding of plan and agreed to proceed R/B/A DISCUSSED WITH PT IN PREOP.  PT VOICED UNDERSTANDING OF PLAN CONSENT SIGNED DAY OF SURGERY PT SEEN AND EXAMINED PRIOR TO OPERATIVE PROCEDURE/DAY OF SURGERY SITE MARKED. QUESTIONS ANSWERED WILL REMAIN AN INPATIENT FOLLOWING SURGERY

## 2012-04-24 NOTE — Transfer of Care (Signed)
Immediate Anesthesia Transfer of Care Note  Patient: [name]  Procedure(s) Performed: Procedure(s) (LRB): LYMPH NODE BIOPSY (Right) IRRIGATION AND DEBRIDEMENT EXTREMITY (Left)  Patient Location: PACU  Anesthesia Type: General  Level of Consciousness: awake, alert  and oriented  Airway & Oxygen Therapy: Patient Spontanous Breathing and Patient connected to nasal cannula oxygen  Post-op Assessment: Report given to PACU RN  Post vital signs: Reviewed and stable  Complications: No apparent anesthesia complications

## 2012-04-25 ENCOUNTER — Encounter (HOSPITAL_COMMUNITY): Payer: Self-pay | Admitting: Otolaryngology

## 2012-04-25 DIAGNOSIS — R7881 Bacteremia: Secondary | ICD-10-CM

## 2012-04-25 DIAGNOSIS — L03119 Cellulitis of unspecified part of limb: Secondary | ICD-10-CM

## 2012-04-25 DIAGNOSIS — J9851 Mediastinitis: Secondary | ICD-10-CM

## 2012-04-25 DIAGNOSIS — A409 Streptococcal sepsis, unspecified: Secondary | ICD-10-CM

## 2012-04-25 DIAGNOSIS — L02519 Cutaneous abscess of unspecified hand: Secondary | ICD-10-CM

## 2012-04-25 LAB — CBC
MCHC: 33.1 g/dL (ref 30.0–36.0)
MCV: 66.7 fL — ABNORMAL LOW (ref 78.0–100.0)
Platelets: 585 10*3/uL — ABNORMAL HIGH (ref 150–400)
RDW: 15.1 % (ref 11.5–15.5)
WBC: 7.8 10*3/uL (ref 4.0–10.5)

## 2012-04-25 LAB — BASIC METABOLIC PANEL
Chloride: 100 mEq/L (ref 96–112)
Creatinine, Ser: 0.6 mg/dL (ref 0.50–1.35)
GFR calc Af Amer: >90 mL/min (ref >90)
GFR calc non Af Amer: >90 mL/min (ref >90)
Potassium: 3.5 mEq/L (ref 3.5–5.1)

## 2012-04-25 NOTE — Progress Notes (Signed)
INFECTIOUS DISEASE PROGRESS NOTE  ID: [name] is a 51 y.o. male with  Active Problems:  Cellulitis and abscess of hand - LEFT  Bacteremia due to Group A Streptococcus  Neck abscess with extension into right chest wall and mediastinum  Psoas abscess, left  Leukocytosis  Transaminitis- ? SHOCK LIVER  SIRS (systemic inflammatory response syndrome)  Pleural effusion, bilateral with compressive atelectasis  Anemia- ? hemodilution  Hypokalemia  Acute respiratory failure with hypoxia  Right ankle swelling with pain and erythema  Subjective: C/o neck pain, pain in L hand with passive motion from PT  Abtx:  Anti-infectives     Start     Dose/Rate Route Frequency Ordered Stop   04/17/12 1600   clindamycin (CLEOCIN) IVPB 900 mg  Status:  Discontinued        900 mg 100 mL/hr over 30 Minutes Intravenous Every 8 hours 04/17/12 1454 04/21/12 1249   04/17/12 1600  Ampicillin-Sulbactam (UNASYN) 3 g in sodium chloride 0.9 % 100 mL IVPB       3 g 100 mL/hr over 60 Minutes Intravenous Every 6 hours 04/17/12 1455     04/14/12 1400   ceFAZolin (ANCEF) IVPB 1 g/50 mL premix  Status:  Discontinued        1 g 100 mL/hr over 30 Minutes Intravenous 3 times per day 04/14/12 1057 04/17/12 1426   04/13/12 1900   vancomycin (VANCOCIN) 500 mg in sodium chloride 0.9 % 100 mL IVPB  Status:  Discontinued        500 mg 100 mL/hr over 60 Minutes Intravenous Every 12 hours 04/13/12 1601 04/14/12 1058   04/13/12 0815   moxifloxacin (AVELOX) IVPB 400 mg        400 mg 250 mL/hr over 60 Minutes Intravenous  Once 04/13/12 0805 04/13/12 0953   04/13/12 0500   vancomycin (VANCOCIN) IVPB 1000 mg/200 mL premix  Status:  Discontinued        1,000 mg 200 mL/hr over 60 Minutes Intravenous Every 12 hours 04/13/12 0453 04/13/12 1414   04/13/12 0445   vancomycin (VANCOCIN) IVPB 1000 mg/200 mL premix        1,000 mg 200 mL/hr over 60 Minutes Intravenous  Once 04/13/12 0442 04/13/12 0607           Medications:  Scheduled:   . ampicillin-sulbactam (UNASYN) IV  3 g Intravenous Q6H  . enoxaparin (LOVENOX) injection  40 mg Subcutaneous Q24H  . HYDROmorphone      .  morphine injection  2 mg Intravenous Once  . mupirocin ointment   Topical BID  . nystatin  10 mL Oral QID  . predniSONE  40 mg Oral Q breakfast  . sodium chloride  3 mL Intravenous Q12H    Objective: Vital signs in last 24 hours: Temp:  [98.1 F (36.7 C)-98.8 F (37.1 C)] 98.5 F (36.9 C) (05/02 1330) Pulse Rate:  [74-96] 92  (05/02 1330) Resp:  [18-20] 18  (05/02 1330) BP: (153-189)/(74-96) 153/88 mmHg (05/02 1330) SpO2:  [91 %-98 %] 94 % (05/02 1330)   General appearance: alert, cooperative and no distress Neck: tender, wound clean.  Chest wall: superior chest/neck wound tender.  Cardio: regular rate and rhythm GI: normal findings: bowel sounds normal and soft, non-tender  Lab Results  Basename 04/25/12 0602 04/24/12 0515  WBC 7.8 9.4  HGB 9.0* 8.9*  HCT 27.2* 27.7*  NA 135 135  K 3.5 3.7  CL 100 100  CO2 25 24  BUN 9 7  CREATININE 0.60 0.59  GLU -- --   Liver Panel  Basename 04/24/12 0515 04/23/12 0522  PROT 7.3 7.0  ALBUMIN 1.7* 1.6*  AST 133* 133*  ALT 85* 72*  ALKPHOS 144* 133*  BILITOT 0.7 0.8  BILIDIR -- --  IBILI -- --   Sedimentation Rate No results found for this basename: ESRSEDRATE in the last 72 hours C-Reactive Protein No results found for this basename: CRP:2 in the last 72 hours  Microbiology: Recent Results (from the past 240 hour(s))  CULTURE, BLOOD (ROUTINE X 2)     Status: Normal   Collection Time   04/17/12  4:00 PM      Component Value Range Status Comment   Specimen Description BLOOD RIGHT HAND   Final    Special Requests BOTTLES DRAWN AEROBIC AND ANAEROBIC 10CC   Final    Culture  Setup Time [id number]   Final    Culture NO GROWTH 5 DAYS   Final    Report Status 04/24/2012 FINAL   Final   CULTURE, BLOOD (ROUTINE X 2)     Status: Normal   Collection  Time   04/17/12  4:05 PM      Component Value Range Status Comment   Specimen Description BLOOD RIGHT HAND   Final    Special Requests BOTTLES DRAWN AEROBIC AND ANAEROBIC 10CC   Final    Culture  Setup Time [id number]   Final    Culture NO GROWTH 5 DAYS   Final    Report Status 04/24/2012 FINAL   Final   SURGICAL PCR SCREEN     Status: Normal   Collection Time   04/17/12  9:32 PM      Component Value Range Status Comment   MRSA, PCR NEGATIVE  NEGATIVE  Final    Staphylococcus aureus NEGATIVE  NEGATIVE  Final   GRAM STAIN     Status: Normal   Collection Time   04/17/12 11:48 PM      Component Value Range Status Comment   Specimen Description ABSCESS NECK RIGHT   Final    Special Requests PATIENT ON FOLLOWING UNASYN   Final    Gram Stain     Final    Value: FEW WBC PRESENT, PREDOMINANTLY PMN CORRECTED ON 04/25 AT 1554: PREVIOUSLY REPORTED AS NO WBC SEEN     RARE GRAM POSITIVE COCCI IN PAIRS IN CHAINS CORRECTED ON 04/25 AT 1555: PREVIOUSLY REPORTED AS NO ORGANISMS SEEN     CALLED [facility] Mendel Corning RN 04/18/12 0040 ACAINJ     REPORT CORRECTED AND CALLED TO BETH BRADT, RN 04/18/12 1555 BY K SCHULTZ   Report Status 04/18/2012 FINAL   Final   ANAEROBIC CULTURE     Status: Normal   Collection Time   04/17/12 11:48 PM      Component Value Range Status Comment   Specimen Description ABSCESS NECK RIGHT   Final    Special Requests PATIENT ON FOLLOWING UNASYN   Final    Gram Stain     Final    Value: FEW WBC PRESENT, PREDOMINANTLY PMN     RARE GRAM POSITIVE COCCI     IN PAIRS IN CHAINS   Culture NO ANAEROBES ISOLATED   Final    Report Status 04/22/2012 FINAL   Final   CULTURE, ROUTINE-ABSCESS     Status: Normal   Collection Time   04/17/12 11:48 PM      Component Value Range Status Comment   Specimen Description ABSCESS NECK RIGHT  Final    Special Requests PATIENT ON FOLLOWING UNASYN   Final    Gram Stain     Final    Value: FEW WBC PRESENT, PREDOMINANTLY PMN     RARE GRAM POSITIVE COCCI      IN PAIRS IN CHAINS Performed at [facility] Gram Stain Report Called to,Read Back By and Verified With: Gram Stain Report Called to,Read Back By and Verified With: [facility] JOHNSON J RN 04/18/12 0040 ACAINJ Gram Stain previously reported as No WBC seen      and No Organisms seen CORRECTED RESULTS CALLED TO: KIM S AT [facility] MICRO [id number] 1300 BY WOODB   Culture RARE GROUP A STREP (S.PYOGENES) ISOLATED   Final    Report Status 04/20/2012 FINAL   Final   CULTURE, ROUTINE-ABSCESS     Status: Normal   Collection Time   04/18/12  9:19 PM      Component Value Range Status Comment   Specimen Description ABSCESS GROIN LEFT   Final    Special Requests PSOAS ASPIRATION   Final    Gram Stain     Final    Value: MODERATE WBC PRESENT,BOTH PMN AND MONONUCLEAR     NO SQUAMOUS EPITHELIAL CELLS SEEN     RARE GRAM POSITIVE COCCI IN PAIRS   Culture NO GROWTH 3 DAYS   Final    Report Status 04/22/2012 FINAL   Final     Studies/Results: Ct Soft Tissue Neck W Contrast  04/24/2012  *RADIOLOGY REPORT*  Clinical Data: 51 year old male with history of right neck and parasternal abscess. Status post operative exploration and drainage. Additional acute infections at other site (extremities).  CT NECK WITH CONTRAST  Technique:  Multidetector CT imaging of the neck was performed with intravenous contrast.  Contrast: OMNIPAQUE IOHEXOL 300 MG/ML  SOLN  Comparison: Preoperative study 04/17/2012.  Findings: Postoperative changes with skin incision at the right supraclavicular neck.  Interval increased obscuration of normal fat planes in the around the right sternocleidomastoid and right pectoralis muscles.  Decreased but residual rim enhancing fluid collection, epicenter between the right lobe of the thyroid and right clavicle.  The right sternoclavicular joint remains involved (series 6 image 98).  There remains a subtle rim enhancing collection deep to the pectoralis muscle near the anterior first and second rib ends (image  97).  There remains a small volume of gas associated with these collections.  The rim enhancing component of the collection tracking cephalad along the deep sternocleidomastoid muscle on the right has mildly progressed.  Additional small gas foci more distally near the right shoulder may be incidental intravenous tear (the right extremity IV access and contrast injection for this exam.  Chest findings are reported separately.  The thyroid remains within normal limits except for mass effect on the right lobe.  Major vascular structures including the right internal jugular vein, right carotid and vertebral arteries remain patent.  Fluid in the retropharyngeal space has decreased.  Parapharyngeal and sublingual spaces remain normal.  Submandibular and parotid glands remain normal.  Pharynx and the larynx contours remain within normal limits.  Visualized orbit soft tissues are within normal limits.  Negative visualized brain parenchyma.  Minimal right sphenoid sinus mucosal thickening.  No acute osseous findings in the face, skull base or cervical spine.  IMPRESSION: 1.  Trans-spatial abscess centered at the right thoracic inlet has modestly decreased following surgery, but significant disease persists - including tracking  along the deep margins of the right pectoralis and right  sternocleidomastoid muscles, and associated with the right sternoclavicular joint.  The involvement along the right SCM has mildly progressed. 2.  Case reviewed in person with Dr. Lazarus Salines at [phone] hours on 04/24/2012.  Original Report Authenticated By: Harley Hallmark, M.D.   Ct Chest W Contrast  04/24/2012  *RADIOLOGY REPORT*  Clinical Data: Abscess with extension into the chest.  CT CHEST WITH CONTRAST  Technique:  Multidetector CT imaging of the chest was performed following the standard protocol during bolus administration of intravenous contrast.  Contrast: OMNIPAQUE IOHEXOL 300 MG/ML  SOLN  Comparison: Chest CT 04/17/2012.   Findings:  Mediastinum: Heart size is normal. There is a small amount of pericardial fluid and/or thickening, with some mild pericardial enhancement, slightly increased compared to prior study 04/17/2012. The previously described collection of gas and fluid in the lower right sternocleidomastoid muscle extending to the sternoclavicular joint is again noted, appears slightly smaller than the prior examination.  Around the sternoclavicular joint there is some abnormal soft tissue thickening and enhancement, which encroaches upon the superior mediastinum.  No more inferior mediastinal extension of this process is identified on today's examination. No pathologically enlarged mediastinal or hilar lymph nodes. Esophagus is unremarkable in appearance.  A right upper extremity PICC is in place with tip terminating in the right atrium.  Lungs/Pleura: There are moderate right and small left-sided pleural effusions layering dependently, with associated passive atelectasis in the lower lobes of the lungs bilaterally.  No definite consolidative airspace disease.  No definite suspicious appearing pulmonary nodules or masses are identified.  Upper Abdomen: Visualized portions are unremarkable.  Musculoskeletal: Again noted is some extension of the abnormal enhancing fluid collection into the muscle belly of the right pectoralis major.  There is some small locules of gas within the right pectoralis major muscle, which may be indicative of gas- forming organisms, or recent surgical intervention. There are no aggressive appearing lytic or blastic lesions noted in the visualized portions of the skeleton.  IMPRESSION: 1.  Slight interval decrease in size of an abnormal enhancing gas containing fluid collection centered in the lower right sternocleidomastoid muscle around the right sternoclavicular joint with some extension of the right pectoralis muscle.  The at this time, there is some inflammatory changes that result in local mass  effect around the right sternoclavicular joint, without frank extension into the mediastinum. 2.  Moderate right and small left-sided pleural effusions layering dependently with associated dependent passive atelectasis in the lower lobes of the lungs bilaterally. 3.  Slight interval increase in the amount of pericardial fluid and/or thickening with pericardial enhancement compared to the prior study. These findings can be seen in the setting of pericarditis.  Clinical correlation is recommended.  Original Report Authenticated By: Florencia Reasons, M.D.     Assessment/Plan: Group A Strep bacteremia, metastatic foci (neck abscess, R sternocleidomastoid/mediastinum/ R ankle cellulitis/myofascitis, L forearm, L groin)  Day 12 anbx (unasyn)  Had debridement of L wrist, R lower neck/mediastinum/chest wall yesterday.   Appears to be unchanged since yesterday, post surgical pain as expected.    Will continue unasyn, watch...   Johny Sax Infectious Diseases [phone] 04/25/2012, 2:04 PM   LOS: 13 days

## 2012-04-25 NOTE — Plan of Care (Signed)
Problem: Phase III Progression Outcomes Goal: Activity at appropriate level-compared to baseline (UP IN CHAIR FOR HEMODIALYSIS)  Outcome: Not Progressing Pt appears with decreased motivation to get up and about if anything is out of sorts.

## 2012-04-25 NOTE — Progress Notes (Signed)
Occupational Therapy Treatment Patient Details Name: [name] MRN: [id number] DOB: February 04, 1961 Today's Date: 04/25/2012 Time: 4098-1191 OT Time Calculation (min): 15 min  OT Assessment / Plan / Recommendation Comments on Treatment Session Decreased ability of patient to want to do his own ROM exercises due to pain, as well as tolerating my assisting him with ROM of his LUE.    Follow Up Recommendations  Home health OT    Equipment Recommendations  3 in 1 bedside comode    Frequency Min 3X/week   Plan Discharge plan remains appropriate    Precautions / Restrictions Precautions Precautions: None Restrictions Weight Bearing Restrictions: Yes LUE Weight Bearing: Weight bear through elbow only   Pertinent Vitals/Pain 8/10 before started (pre-medicated per pt and had ice on LUE when I arrived)--pain increased with activity--ice reapplied post exercises    ADL       OT Goals Arm Goals Arm Goal: PROM - Progress: Not progressing (increased pain from I & D yesterday) Arm Goal: Additional Goal #1 - Progress: Not progressing (Pt with increased pain from I & D yesterday)  Visit Information  Last OT Received On: 04/25/12 Assistance Needed: +1    Subjective Data  Subjective: First my neck will be hurting and then it will be my arm   Prior Functioning       Cognition  Overall Cognitive Status: Appears within functional limits for tasks assessed/performed    Mobility     Exercises Other Exercises Other Exercises: Pt seen for LUE exercises due to surgery yesterday, no change in forearm supination (still can get from full pronation to neutral), reports with elbow flexion/extension and shoulder flexion/extension that it hurts his neck on the right side where they redid the I & D so he is reluctant to do these exercises--did get him to do 10 reps of the elbow, but only 1 of the shoulder.  Decreased PROM toleration of composite digit  flexion/extension as well as single digit  flexion/extension today. Will continue to follow.  Balance    End of Session     Evette Georges [phone] 04/25/2012, 4:06 PM

## 2012-04-25 NOTE — Progress Notes (Signed)
Subjective:  S/p surgery, c/o pain in neck and hand, denies SOB, tolerating po.   Objective: Vital signs in last 24 hours: Temp:  [98.1 F (36.7 C)-98.8 F (37.1 C)] 98.5 F (36.9 C) (05/02 1330) Pulse Rate:  [74-96] 92  (05/02 1330) Resp:  [18] 18  (05/02 1330) BP: (153-189)/(74-96) 153/88 mmHg (05/02 1330) SpO2:  [91 %-98 %] 94 % (05/02 1330) Weight change:  Last BM Date: 04/24/12  Intake/Output from previous day: 05/01 0701 - 05/02 0700 In: 3328 [P.O.:600; I.V.:2728] Out: 1020 [Urine:1000; Blood:20] Total I/O In: 1140 [P.O.:360; I.V.:780] Out: 200 [Urine:200]   Physical Exam: General: A&Ox3, in no resp distress NECK:  has dressings, which are clean and dry today. CHEST:  Clinically clear to auscultation, no wheezes, no crackles. HEART: normal S1,S2, regular, no murmurs. ABDOMEN:  Full, soft, non-tender, no palpable organomegaly, no palpable masses, normal bowel sounds. UPPER EXTREMITIES: Left hand and forearm with dressing clean and dry.  LOWER EXTREMITIES:  No edema, palpable peripheral pulses. Dorsum of right foot is swollen, but right ankle is non-tender, and has full ROM. Right 1st MTP -nontender CENTRAL NERVOUS SYSTEM:  No focal neurologic deficit on gross examination.  Lab Results:  [facility] 04/25/12 0602 04/24/12 0515  WBC 7.8 9.4  HGB 9.0* 8.9*  HCT 27.2* 27.7*  PLT 585* 601*    Basename 04/25/12 0602 04/24/12 0515  NA 135 135  K 3.5 3.7  CL 100 100  CO2 25 24  GLUCOSE 104* 121*  BUN 9 7  CREATININE 0.60 0.59  CALCIUM 8.4 8.4   Recent Results (from the past 240 hour(s))  CULTURE, BLOOD (ROUTINE X 2)     Status: Normal   Collection Time   04/17/12  4:00 PM      Component Value Range Status Comment   Specimen Description BLOOD RIGHT HAND   Final    Special Requests BOTTLES DRAWN AEROBIC AND ANAEROBIC 10CC   Final    Culture  Setup Time [id number]   Final    Culture NO GROWTH 5 DAYS   Final    Report Status 04/24/2012 FINAL   Final   CULTURE,  BLOOD (ROUTINE X 2)     Status: Normal   Collection Time   04/17/12  4:05 PM      Component Value Range Status Comment   Specimen Description BLOOD RIGHT HAND   Final    Special Requests BOTTLES DRAWN AEROBIC AND ANAEROBIC 10CC   Final    Culture  Setup Time [id number]   Final    Culture NO GROWTH 5 DAYS   Final    Report Status 04/24/2012 FINAL   Final   SURGICAL PCR SCREEN     Status: Normal   Collection Time   04/17/12  9:32 PM      Component Value Range Status Comment   MRSA, PCR NEGATIVE  NEGATIVE  Final    Staphylococcus aureus NEGATIVE  NEGATIVE  Final   GRAM STAIN     Status: Normal   Collection Time   04/17/12 11:48 PM      Component Value Range Status Comment   Specimen Description ABSCESS NECK RIGHT   Final    Special Requests PATIENT ON FOLLOWING UNASYN   Final    Gram Stain     Final    Value: FEW WBC PRESENT, PREDOMINANTLY PMN CORRECTED ON 04/25 AT 1554: PREVIOUSLY REPORTED AS NO WBC SEEN     RARE GRAM POSITIVE COCCI IN PAIRS IN CHAINS CORRECTED ON 04/25  AT 1555: PREVIOUSLY REPORTED AS NO ORGANISMS SEEN     CALLED [facility] Mendel Corning RN 04/18/12 0040 ACAINJ     REPORT CORRECTED AND CALLED TO BETH BRADT, RN 04/18/12 1555 BY K SCHULTZ   Report Status 04/18/2012 FINAL   Final   ANAEROBIC CULTURE     Status: Normal   Collection Time   04/17/12 11:48 PM      Component Value Range Status Comment   Specimen Description ABSCESS NECK RIGHT   Final    Special Requests PATIENT ON FOLLOWING UNASYN   Final    Gram Stain     Final    Value: FEW WBC PRESENT, PREDOMINANTLY PMN     RARE GRAM POSITIVE COCCI     IN PAIRS IN CHAINS   Culture NO ANAEROBES ISOLATED   Final    Report Status 04/22/2012 FINAL   Final   CULTURE, ROUTINE-ABSCESS     Status: Normal   Collection Time   04/17/12 11:48 PM      Component Value Range Status Comment   Specimen Description ABSCESS NECK RIGHT   Final    Special Requests PATIENT ON FOLLOWING UNASYN   Final    Gram Stain     Final    Value: FEW WBC  PRESENT, PREDOMINANTLY PMN     RARE GRAM POSITIVE COCCI     IN PAIRS IN CHAINS Performed at [facility] Gram Stain Report Called to,Read Back By and Verified With: Gram Stain Report Called to,Read Back By and Verified With: [facility] JOHNSON J RN 04/18/12 0040 ACAINJ Gram Stain previously reported as No WBC seen      and No Organisms seen CORRECTED RESULTS CALLED TO: KIM S AT [facility] MICRO [id number] BY WOODB   Culture RARE GROUP A STREP (S.PYOGENES) ISOLATED   Final    Report Status 04/20/2012 FINAL   Final   CULTURE, ROUTINE-ABSCESS     Status: Normal   Collection Time   04/18/12  9:19 PM      Component Value Range Status Comment   Specimen Description ABSCESS GROIN LEFT   Final    Special Requests PSOAS ASPIRATION   Final    Gram Stain     Final    Value: MODERATE WBC PRESENT,BOTH PMN AND MONONUCLEAR     NO SQUAMOUS EPITHELIAL CELLS SEEN     RARE GRAM POSITIVE COCCI IN PAIRS   Culture NO GROWTH 3 DAYS   Final    Report Status 04/22/2012 FINAL   Final      Studies/Results: Ct Soft Tissue Neck W Contrast  04/24/2012  *RADIOLOGY REPORT*  Clinical Data: 51 year old male with history of right neck and parasternal abscess. Status post operative exploration and drainage. Additional acute infections at other site (extremities).  CT NECK WITH CONTRAST  Technique:  Multidetector CT imaging of the neck was performed with intravenous contrast.  Contrast: OMNIPAQUE IOHEXOL 300 MG/ML  SOLN  Comparison: Preoperative study 04/17/2012.  Findings: Postoperative changes with skin incision at the right supraclavicular neck.  Interval increased obscuration of normal fat planes in the around the right sternocleidomastoid and right pectoralis muscles.  Decreased but residual rim enhancing fluid collection, epicenter between the right lobe of the thyroid and right clavicle.  The right sternoclavicular joint remains involved (series 6 image 98).  There remains a subtle rim enhancing collection deep to the pectoralis  muscle near the anterior first and second rib ends (image 97).  There remains a small volume of gas associated  with these collections.  The rim enhancing component of the collection tracking cephalad along the deep sternocleidomastoid muscle on the right has mildly progressed.  Additional small gas foci more distally near the right shoulder may be incidental intravenous tear (the right extremity IV access and contrast injection for this exam.  Chest findings are reported separately.  The thyroid remains within normal limits except for mass effect on the right lobe.  Major vascular structures including the right internal jugular vein, right carotid and vertebral arteries remain patent.  Fluid in the retropharyngeal space has decreased.  Parapharyngeal and sublingual spaces remain normal.  Submandibular and parotid glands remain normal.  Pharynx and the larynx contours remain within normal limits.  Visualized orbit soft tissues are within normal limits.  Negative visualized brain parenchyma.  Minimal right sphenoid sinus mucosal thickening.  No acute osseous findings in the face, skull base or cervical spine.  IMPRESSION: 1.  Trans-spatial abscess centered at the right thoracic inlet has modestly decreased following surgery, but significant disease persists - including tracking  along the deep margins of the right pectoralis and right sternocleidomastoid muscles, and associated with the right sternoclavicular joint.  The involvement along the right SCM has mildly progressed. 2.  Case reviewed in person with Dr. Lazarus Salines at [phone] hours on 04/24/2012.  Original Report Authenticated By: Harley Hallmark, M.D.   Ct Chest W Contrast  04/24/2012  *RADIOLOGY REPORT*  Clinical Data: Abscess with extension into the chest.  CT CHEST WITH CONTRAST  Technique:  Multidetector CT imaging of the chest was performed following the standard protocol during bolus administration of intravenous contrast.  Contrast: OMNIPAQUE IOHEXOL  300 MG/ML  SOLN  Comparison: Chest CT 04/17/2012.  Findings:  Mediastinum: Heart size is normal. There is a small amount of pericardial fluid and/or thickening, with some mild pericardial enhancement, slightly increased compared to prior study 04/17/2012. The previously described collection of gas and fluid in the lower right sternocleidomastoid muscle extending to the sternoclavicular joint is again noted, appears slightly smaller than the prior examination.  Around the sternoclavicular joint there is some abnormal soft tissue thickening and enhancement, which encroaches upon the superior mediastinum.  No more inferior mediastinal extension of this process is identified on today's examination. No pathologically enlarged mediastinal or hilar lymph nodes. Esophagus is unremarkable in appearance.  A right upper extremity PICC is in place with tip terminating in the right atrium.  Lungs/Pleura: There are moderate right and small left-sided pleural effusions layering dependently, with associated passive atelectasis in the lower lobes of the lungs bilaterally.  No definite consolidative airspace disease.  No definite suspicious appearing pulmonary nodules or masses are identified.  Upper Abdomen: Visualized portions are unremarkable.  Musculoskeletal: Again noted is some extension of the abnormal enhancing fluid collection into the muscle belly of the right pectoralis major.  There is some small locules of gas within the right pectoralis major muscle, which may be indicative of gas- forming organisms, or recent surgical intervention. There are no aggressive appearing lytic or blastic lesions noted in the visualized portions of the skeleton.  IMPRESSION: 1.  Slight interval decrease in size of an abnormal enhancing gas containing fluid collection centered in the lower right sternocleidomastoid muscle around the right sternoclavicular joint with some extension of the right pectoralis muscle.  The at this time, there is  some inflammatory changes that result in local mass effect around the right sternoclavicular joint, without frank extension into the mediastinum. 2.  Moderate right and small  left-sided pleural effusions layering dependently with associated dependent passive atelectasis in the lower lobes of the lungs bilaterally. 3.  Slight interval increase in the amount of pericardial fluid and/or thickening with pericardial enhancement compared to the prior study. These findings can be seen in the setting of pericarditis.  Clinical correlation is recommended.  Original Report Authenticated By: Florencia Reasons, M.D.    Medications: Scheduled Meds:    . ampicillin-sulbactam (UNASYN) IV  3 g Intravenous Q6H  . enoxaparin (LOVENOX) injection  40 mg Subcutaneous Q24H  . HYDROmorphone      .  morphine injection  2 mg Intravenous Once  . mupirocin ointment   Topical BID  . nystatin  10 mL Oral QID  . predniSONE  40 mg Oral Q breakfast  . sodium chloride  3 mL Intravenous Q12H   Continuous Infusions:    . sodium chloride 100 mL/hr at 04/25/12 1544   PRN Meds:.acetaminophen, morphine injection, ondansetron (ZOFRAN) IV, ondansetron, oxyCODONE, phenol, sodium chloride, DISCONTD: HYDROmorphone, DISCONTD: iohexol, DISCONTD: ondansetron (ZOFRAN) IV  Assessment/Plan:  Active Problems:  1. Bacteremia due to Group A Streptococcus/ Sepsis: Patient had an upper respiratory infection on April 13 for which he sought medical care but did not receive antibiotics and it is presumed he was treated for a viral upper respiratory infection. He failed to improve, subsequently developed more constitutional symptoms and presented to our hospital with current condition. Septic work up revealed Group A streptococcal bacteremia, with multiple metastatic infective foci. ID consultation was provided by Dr Acey Lav, who was very helpful in rationalizing antibiotic therapy. Patient is currently on Unasyn but will be  transitioned to Rocephin monotherapy on discharge. Duration is unclear at this time. 2D Echocardiogram of 04/18/12, showed normal left ventricular cavity size, mild concentric hypertrophy and ejection fraction of 60% to 65%, with no regional wall motion abnormalities. A small to moderate pericardial effusion was identified circumferential to the heart and no vegetations were identified. Dr Dietrich Pates, [place] cardiologist, reviewed the echocardiogram and  in her opinion the effusion is actually small and no tamponade physiology is seen.  -Appreciate ID assistance with antibiotic recommendations. 2. Multiple septic metastatic foci/abscesses and cellulitis: Patient presented with a swollen left hand and arm, as well as generalized pains. Evaluation, revealed multiple foci of infection, including neck abscess with extension into right chest wall/Sterno-clavicular joint and mediastinum, cellulitis and abscess of Left hand/Left Psoas abscess/ Right ankle and thigh swelling with pain and erythema- ? Evolving abscess. ENT consult was provided by Dr Flo Shanks, who performed darinage of neck/mediastinal abscesses, Dr Bradly Bienenstock carried out debridement of LUE and aspiration of right ankle on 04/18/12. Aspiration of left  iliopsoas abscess was performed by Dr Oley Balm,  interventional radiologist, on the same date. Patient had a transaminitis, likely due to sepsis, but this is improving with treatment.   -repeat neck CT this am revealed loculations in low RIGHT neck, intramuscular RIGHT SCM, sub pectoral accumulations -s/p I&D per Dr Lazarus Salines again on 5/1, follow -Continuing unasyn per ID - monitor for fevers, wbc 3. Acute respiratory failure with hypoxia/ Pleural effusion, bilateral with compressive atelectasis:  Patient had transient hypoxia, soon after hospitalization and procedures. This was felt multifactorial, secondary to pleural effusions and compressive atelectasis,as well a s oioid analgesics. He was  deemed at risk for developing HCAP, but recovered with incentive spirometry and oxygen supplementation. 4. Thrush: An incidental finding. Improving with Nystatin swish and swallow.  5. Anemia:   Patient had a  mild microcytic anemia, with Hemoglobin of 11.2 and MCV of 67.1 at presentation. During this hospitalization, hemoglobin has dropped, possibly because of added critical illness anemia. Hematinic studies revealed iron deficiency. -hemoglobin stable, may need outpatient GI workup 6. Gout: Patient has a history of gout, and today, had right 1st MTP joint swelling 4/30, redness and pain.  -continue a 5-day course of oral Prednisone. -continuing to improve     LOS: 13 days   Delphin Funes C 04/25/2012, 5:40 PM

## 2012-04-25 NOTE — Progress Notes (Signed)
PT Cancellation Note  Treatment cancelled today due to patient's refusal to participate. Again pt reporting last nights surgery took too much out of him. Spent at least 8 minutes with pt trying to convince him to at least get up to the chair today. Will attempt to f/u tomorrow.  [facility] HELEN 04/25/2012, 11:22 AM Pager: [phone]

## 2012-04-25 NOTE — Progress Notes (Signed)
04/25/2012 8:58 AM  Ast, Cardin [id number]  Post-Op Day 1/8    Temp:  [98.2 F (36.8 C)-98.8 F (37.1 C)] 98.2 F (36.8 C) (05/02 0617) Pulse Rate:  [74-96] 93  (05/02 0617) Resp:  [18-20] 18  (05/02 0617) BP: (153-189)/(74-96) 154/74 mmHg (05/02 0617) SpO2:  [91 %-98 %] 98 % (05/02 0617),     Intake/Output Summary (Last 24 hours) at 04/25/12 0858 Last data filed at 04/25/12 4098  Gross per 24 hour  Intake   3328 ml  Output   1020 ml  Net   2308 ml    Results for orders placed during the hospital encounter of 04/12/12 (from the past 24 hour(s))  CBC     Status: Abnormal   Collection Time   04/25/12  6:02 AM      Component Value Range   WBC 7.8  4.0 - 10.5 (K/uL)   RBC 4.08 (*) 4.22 - 5.81 (MIL/uL)   Hemoglobin 9.0 (*) 13.0 - 17.0 (g/dL)   HCT 11.9 (*) 14.7 - 52.0 (%)   MCV 66.7 (*) 78.0 - 100.0 (fL)   MCH 22.1 (*) 26.0 - 34.0 (pg)   MCHC 33.1  30.0 - 36.0 (g/dL)   RDW 82.9  56.2 - 13.0 (%)   Platelets 585 (*) 150 - 400 (K/uL)  BASIC METABOLIC PANEL     Status: Abnormal   Collection Time   04/25/12  6:02 AM      Component Value Range   Sodium 135  135 - 145 (mEq/L)   Potassium 3.5  3.5 - 5.1 (mEq/L)   Chloride 100  96 - 112 (mEq/L)   CO2 25  19 - 32 (mEq/L)   Glucose, Bld 104 (*) 70 - 99 (mg/dL)   BUN 9  6 - 23 (mg/dL)   Creatinine, Ser 8.65  0.50 - 1.35 (mg/dL)   Calcium 8.4  8.4 - 78.4 (mg/dL)   GFR calc non Af Amer >90  >90 (mL/min)   GFR calc Af Amer >90  >90 (mL/min)    SUBJECTIVE:  Large pain at surgical site.  No SOB.  Able to swallow and breathe OK.  OBJECTIVE:  Large serous drainage.  Dressing and packing secure.  IMPRESSION:  Afeb.  WBC down.  NO frank pus identified at surgery last PM.  PLAN:  Will begin advancing packing tomorrow.  Advance diet and activity.  Follow WBC.  [name]

## 2012-04-25 NOTE — Op Note (Signed)
NAMEORLIE, CUNDARI NO.:  [id number]  MEDICAL RECORD NO.:  [id number]  LOCATION:  5153                         FACILITY:  [facility]  PHYSICIAN:  Madelynn Done, MD  DATE OF BIRTH:  09/27/1961  DATE OF PROCEDURE:  04/24/2012 DATE OF DISCHARGE:                              OPERATIVE REPORT   PREOPERATIVE DIAGNOSIS:  Left wrist septic arthritis.  POSTOPERATIVE DIAGNOSIS:  Left wrist septic arthritis.  ATTENDING PHYSICIAN:  Madelynn Done, MD, who was scrubbed and present for the entire procedure.  ASSISTANT SURGEON:  None.  SURGICAL PROCEDURE:  Left wrist arthrotomy and drainage of infected wrist joint.  SURGICAL INDICATIONS:  Mr. Sarver is a 51 year old gentleman who was initially taken to the OR approximately 6 days ago for incision and drainage of his left wrist.  The patient was seen and evaluated, and based on his still swelling present dorsally as well as small amount of drainage dorsally, it was recommended that he undergo repeat I and D. Risks, benefits and alternatives were discussed in detail with the patient and signed informed consent was obtained.  Risks include, but are not limited to bleeding; infection; damage to nearby nerves, arteries, or tendons; loss of motion of the wrist and digits; need for further surgical intervention.  INTRAOPERATIVE FINDINGS:  The patient did have small amount of drainage coming from the dorsal incision.  The volar wound looked very good.  It was felt therefore to approach the wrist dorsally again.  DESCRIPTION OF THE PROCEDURE:  The patient was appropriately identified in the preop holding area and marked by permanent marker, made on the left wrist to indicate the correct operative site.  He was then brought back to the operating room and placed supine on the anesthesia room table.  General anesthesia was administered.  The patient tolerated this well.  A well-padded tourniquet was then placed on the left  brachium, sealed with 1000 drape.  Left upper extremity was prepped and draped in normal sterile fashion.  Time-out was called.  Correct site was identified and the procedure was then begun.  Attention was turned to the left wrist, previous skin staples were then removed and the tourniquet was not inflated.  Dissection was then carried down through the extensor tendon to the retinaculum.  Joint arthrotomy was then carried out.  The patient had a small amount of serous fluid.  No gross purulence.  The joint was then opened up and then copious wound irrigation was then done on the joint.  The joint was then thoroughly irrigated.  Synovectomy was then carried out dorsally.  After thoroughly irrigating the wound, the capsule was then loosely reapproximated with one 3-0 Monocryl suture and then the skin was then closed using 4-0 Prolene sutures.  Xeroform dressings were then applied.  Sterile compressive bandage was then applied.  The patient tolerated the procedure well, was placed on a well-padded volar splint, extubated, and taken to the recovery room in good condition.  POSTOPERATIVE PLAN:  The patient will be admitted, continued on the IV antibiotics in the Internal Medicine Service.  We will likely continue to follow him closely.     Sharma Covert  IV, MD     FWO/MEDQ  D:  04/24/2012  T:  04/25/2012  Job:  [id number]

## 2012-04-26 DIAGNOSIS — A409 Streptococcal sepsis, unspecified: Secondary | ICD-10-CM

## 2012-04-26 DIAGNOSIS — L02519 Cutaneous abscess of unspecified hand: Secondary | ICD-10-CM

## 2012-04-26 DIAGNOSIS — R7881 Bacteremia: Secondary | ICD-10-CM

## 2012-04-26 DIAGNOSIS — J9851 Mediastinitis: Secondary | ICD-10-CM

## 2012-04-26 DIAGNOSIS — L03119 Cellulitis of unspecified part of limb: Secondary | ICD-10-CM

## 2012-04-26 LAB — CBC
HCT: 26.1 % — ABNORMAL LOW (ref 39.0–52.0)
Hemoglobin: 8.3 g/dL — ABNORMAL LOW (ref 13.0–17.0)
MCH: 21.3 pg — ABNORMAL LOW (ref 26.0–34.0)
MCHC: 31.8 g/dL (ref 30.0–36.0)

## 2012-04-26 NOTE — Progress Notes (Signed)
2 Days Post-Op  Subjective: Pain in neck only mild.  Swallowing and breathing well.  Objective: Vital signs in last 24 hours: Temp:  [98.1 F (36.7 C)-98.6 F (37 C)] 98.3 F (36.8 C) (05/03 0540) Pulse Rate:  [82-96] 82  (05/03 0540) Resp:  [18] 18  (05/03 0540) BP: (153-166)/(71-88) 161/71 mmHg (05/03 0540) SpO2:  [92 %-97 %] 95 % (05/03 0540) Last BM Date: 04/24/12  Intake/Output from previous day: 05/02 0701 - 05/03 0700 In: 2972.4 [P.O.:600; I.V.:2372.4] Out: 3075 [Urine:3075] Intake/Output this shift:    General appearance: alert, cooperative and no distress Neck: Dressing removed from neck.  Gauze strips seen coming from wound.  Packed down over clavicle.  Cloudy serosanguinous drainage.  Lab Results:   Basename 04/26/12 0555 04/25/12 0602  WBC 6.9 7.8  HGB 8.3* 9.0*  HCT 26.1* 27.2*  PLT 508* 585*   BMET  Basename 04/25/12 0602 04/24/12 0515  NA 135 135  K 3.5 3.7  CL 100 100  CO2 25 24  GLUCOSE 104* 121*  BUN 9 7  CREATININE 0.60 0.59  CALCIUM 8.4 8.4   PT/INR No results found for this basename: LABPROT:2,INR:2 in the last 72 hours ABG No results found for this basename: PHART:2,PCO2:2,PO2:2,HCO3:2 in the last 72 hours  Studies/Results: No results found.  Anti-infectives: Anti-infectives     Start     Dose/Rate Route Frequency Ordered Stop   04/17/12 1600   clindamycin (CLEOCIN) IVPB 900 mg  Status:  Discontinued        900 mg 100 mL/hr over 30 Minutes Intravenous Every 8 hours 04/17/12 1454 04/21/12 1249   04/17/12 1600   Ampicillin-Sulbactam (UNASYN) 3 g in sodium chloride 0.9 % 100 mL IVPB        3 g 100 mL/hr over 60 Minutes Intravenous Every 6 hours 04/17/12 1455     04/14/12 1400   ceFAZolin (ANCEF) IVPB 1 g/50 mL premix  Status:  Discontinued        1 g 100 mL/hr over 30 Minutes Intravenous 3 times per day 04/14/12 1057 04/17/12 1426   04/13/12 1900   vancomycin (VANCOCIN) 500 mg in sodium chloride 0.9 % 100 mL IVPB  Status:   Discontinued        500 mg 100 mL/hr over 60 Minutes Intravenous Every 12 hours 04/13/12 1601 04/14/12 1058   04/13/12 0815   moxifloxacin (AVELOX) IVPB 400 mg        400 mg 250 mL/hr over 60 Minutes Intravenous  Once 04/13/12 0805 04/13/12 0953   04/13/12 0500   vancomycin (VANCOCIN) IVPB 1000 mg/200 mL premix  Status:  Discontinued        1,000 mg 200 mL/hr over 60 Minutes Intravenous Every 12 hours 04/13/12 0453 04/13/12 1414   04/13/12 0445   vancomycin (VANCOCIN) IVPB 1000 mg/200 mL premix        1,000 mg 200 mL/hr over 60 Minutes Intravenous  Once 04/13/12 0442 04/13/12 0607          Assessment/Plan: Right neck abscess/cellulitis  Right neck wound packed with cotton packing.  I do not believe that slowly backing out the packing will serve the wound well.  I will take him to the operating room tomorrow to remove packing under anesthesia and either repack or change to Penrose drains, which can be backed out slowly with better effect.  LOS: 14 days    [name] 04/26/2012

## 2012-04-26 NOTE — Progress Notes (Signed)
INFECTIOUS DISEASE PROGRESS NOTE  ID: [name] is a 51 y.o. male with   Active Problems:  Cellulitis and abscess of hand - LEFT  Bacteremia due to Group A Streptococcus  Neck abscess with extension into right chest wall and mediastinum  Psoas abscess, left  Leukocytosis  Transaminitis- ? SHOCK LIVER  SIRS (systemic inflammatory response syndrome)  Pleural effusion, bilateral with compressive atelectasis  Anemia- ? hemodilution  Hypokalemia  Acute respiratory failure with hypoxia  Right ankle swelling with pain and erythema  Subjective: Feels good  Abtx:  Anti-infectives     Start     Dose/Rate Route Frequency Ordered Stop   04/17/12 1600   clindamycin (CLEOCIN) IVPB 900 mg  Status:  Discontinued        900 mg 100 mL/hr over 30 Minutes Intravenous Every 8 hours 04/17/12 1454 04/21/12 1249   04/17/12 1600   Ampicillin-Sulbactam (UNASYN) 3 g in sodium chloride 0.9 % 100 mL IVPB        3 g 100 mL/hr over 60 Minutes Intravenous Every 6 hours 04/17/12 1455     04/14/12 1400   ceFAZolin (ANCEF) IVPB 1 g/50 mL premix  Status:  Discontinued        1 g 100 mL/hr over 30 Minutes Intravenous 3 times per day 04/14/12 1057 04/17/12 1426   04/13/12 1900   vancomycin (VANCOCIN) 500 mg in sodium chloride 0.9 % 100 mL IVPB  Status:  Discontinued        500 mg 100 mL/hr over 60 Minutes Intravenous Every 12 hours 04/13/12 1601 04/14/12 1058   04/13/12 0815   moxifloxacin (AVELOX) IVPB 400 mg        400 mg 250 mL/hr over 60 Minutes Intravenous  Once 04/13/12 0805 04/13/12 0953   04/13/12 0500   vancomycin (VANCOCIN) IVPB 1000 mg/200 mL premix  Status:  Discontinued        1,000 mg 200 mL/hr over 60 Minutes Intravenous Every 12 hours 04/13/12 0453 04/13/12 1414   04/13/12 0445   vancomycin (VANCOCIN) IVPB 1000 mg/200 mL premix        1,000 mg 200 mL/hr over 60 Minutes Intravenous  Once 04/13/12 0442 04/13/12 0607          Medications:  Scheduled:   . ampicillin-sulbactam  (UNASYN) IV  3 g Intravenous Q6H  . enoxaparin (LOVENOX) injection  40 mg Subcutaneous Q24H  . mupirocin ointment   Topical BID  . nystatin  10 mL Oral QID  . predniSONE  40 mg Oral Q breakfast  . sodium chloride  3 mL Intravenous Q12H    Objective: Vital signs in last 24 hours: Temp:  [98.3 F (36.8 C)-98.6 F (37 C)] 98.3 F (36.8 C) (05/03 0540) Pulse Rate:  [82-95] 82  (05/03 0540) Resp:  [18] 18  (05/03 0540) BP: (155-166)/(71-85) 161/71 mmHg (05/03 0540) SpO2:  [92 %-97 %] 95 % (05/03 0540)   General appearance: alert, cooperative and no distress Neck: dressing in place.  Resp: clear to auscultation bilaterally Cardio: regular rate and rhythm GI: normal findings: bowel sounds normal and soft, non-tender  Lab Results  Basename 04/26/12 0555 04/25/12 0602 04/24/12 0515  WBC 6.9 7.8 --  HGB 8.3* 9.0* --  HCT 26.1* 27.2* --  NA -- 135 135  K -- 3.5 3.7  CL -- 100 100  CO2 -- 25 24  BUN -- 9 7  CREATININE -- 0.60 0.59  GLU -- -- --   Liver Panel  Basename  04/24/12 0515  PROT 7.3  ALBUMIN 1.7*  AST 133*  ALT 85*  ALKPHOS 144*  BILITOT 0.7  BILIDIR --  IBILI --   Sedimentation Rate No results found for this basename: ESRSEDRATE in the last 72 hours C-Reactive Protein No results found for this basename: CRP:2 in the last 72 hours  Microbiology: Recent Results (from the past 240 hour(s))  CULTURE, BLOOD (ROUTINE X 2)     Status: Normal   Collection Time   04/17/12  4:00 PM      Component Value Range Status Comment   Specimen Description BLOOD RIGHT HAND   Final    Special Requests BOTTLES DRAWN AEROBIC AND ANAEROBIC 10CC   Final    Culture  Setup Time [id number]   Final    Culture NO GROWTH 5 DAYS   Final    Report Status 04/24/2012 FINAL   Final   CULTURE, BLOOD (ROUTINE X 2)     Status: Normal   Collection Time   04/17/12  4:05 PM      Component Value Range Status Comment   Specimen Description BLOOD RIGHT HAND   Final    Special Requests  BOTTLES DRAWN AEROBIC AND ANAEROBIC 10CC   Final    Culture  Setup Time [id number]   Final    Culture NO GROWTH 5 DAYS   Final    Report Status 04/24/2012 FINAL   Final   SURGICAL PCR SCREEN     Status: Normal   Collection Time   04/17/12  9:32 PM      Component Value Range Status Comment   MRSA, PCR NEGATIVE  NEGATIVE  Final    Staphylococcus aureus NEGATIVE  NEGATIVE  Final   GRAM STAIN     Status: Normal   Collection Time   04/17/12 11:48 PM      Component Value Range Status Comment   Specimen Description ABSCESS NECK RIGHT   Final    Special Requests PATIENT ON FOLLOWING UNASYN   Final    Gram Stain     Final    Value: FEW WBC PRESENT, PREDOMINANTLY PMN CORRECTED ON 04/25 AT 1554: PREVIOUSLY REPORTED AS NO WBC SEEN     RARE GRAM POSITIVE COCCI IN PAIRS IN CHAINS CORRECTED ON 04/25 AT 1555: PREVIOUSLY REPORTED AS NO ORGANISMS SEEN     CALLED [facility] Mendel Corning RN 04/18/12 0040 ACAINJ     REPORT CORRECTED AND CALLED TO BETH BRADT, RN 04/18/12 1555 BY K SCHULTZ   Report Status 04/18/2012 FINAL   Final   ANAEROBIC CULTURE     Status: Normal   Collection Time   04/17/12 11:48 PM      Component Value Range Status Comment   Specimen Description ABSCESS NECK RIGHT   Final    Special Requests PATIENT ON FOLLOWING UNASYN   Final    Gram Stain     Final    Value: FEW WBC PRESENT, PREDOMINANTLY PMN     RARE GRAM POSITIVE COCCI     IN PAIRS IN CHAINS   Culture NO ANAEROBES ISOLATED   Final    Report Status 04/22/2012 FINAL   Final   CULTURE, ROUTINE-ABSCESS     Status: Normal   Collection Time   04/17/12 11:48 PM      Component Value Range Status Comment   Specimen Description ABSCESS NECK RIGHT   Final    Special Requests PATIENT ON FOLLOWING UNASYN   Final    Gram Stain  Final    Value: FEW WBC PRESENT, PREDOMINANTLY PMN     RARE GRAM POSITIVE COCCI     IN PAIRS IN CHAINS Performed at [facility] Gram Stain Report Called to,Read Back By and Verified With: Gram Stain Report  Called to,Read Back By and Verified With: [facility] JOHNSON J RN 04/18/12 0040 ACAINJ Gram Stain previously reported as No WBC seen      and No Organisms seen CORRECTED RESULTS CALLED TO: KIM S AT [facility] MICRO [id number] 1300 BY WOODB   Culture RARE GROUP A STREP (S.PYOGENES) ISOLATED   Final    Report Status 04/20/2012 FINAL   Final   CULTURE, ROUTINE-ABSCESS     Status: Normal   Collection Time   04/18/12  9:19 PM      Component Value Range Status Comment   Specimen Description ABSCESS GROIN LEFT   Final    Special Requests PSOAS ASPIRATION   Final    Gram Stain     Final    Value: MODERATE WBC PRESENT,BOTH PMN AND MONONUCLEAR     NO SQUAMOUS EPITHELIAL CELLS SEEN     RARE GRAM POSITIVE COCCI IN PAIRS   Culture NO GROWTH 3 DAYS   Final    Report Status 04/22/2012 FINAL   Final     Studies/Results: No results found.   Assessment/Plan: Group A Strep bacteremia, metastatic foci (neck abscess, R sternocleidomastoid/mediastinum/ R ankle cellulitis/myofascitis, L forearm, L groin)  Day 13 anbx (unasyn)  Had debridement of L wrist, R lower neck/mediastinum/chest wall 04-24-12.  Doing well, back to OR tomorrow for changing neck dressings.  Will continue unasyn, watch... Dr Luciana Axe available if questions over w/e.   Johny Sax Infectious Diseases [phone] 04/26/2012, 3:25 PM   LOS: 14 days

## 2012-04-26 NOTE — Progress Notes (Signed)
PT Cancellation Note     Treatment cancelled today due to patient'spolite refusal to participate this late in the day  "too much has gone on"  04/26/2012  Hinckley Bing, PT [phone] (pager)

## 2012-04-26 NOTE — Progress Notes (Signed)
PT SEEN/EXAMINED WRIST DOING OK WILL LOOK AT WRIST NEXT WEEK WAS MUCH BETTER ON WED DURING LAST DEBRIDEMENT WILL CONTINUE TO FOLLOW

## 2012-04-26 NOTE — Progress Notes (Addendum)
Subjective:  Denies any new c/o   Objective: Vital signs in last 24 hours: Temp:  [97.8 F (36.6 C)-98.6 F (37 C)] 97.8 F (36.6 C) (05/03 1500) Pulse Rate:  [82-95] 82  (05/03 1500) Resp:  [18] 18  (05/03 1500) BP: (155-166)/(71-101) 161/101 mmHg (05/03 1500) SpO2:  [92 %-97 %] 95 % (05/03 1500) Weight change:  Last BM Date: 04/24/12  Intake/Output from previous day: 05/02 0701 - 05/03 0700 In: 2972.4 [P.O.:600; I.V.:2372.4] Out: 3375 [Urine:3375] Total I/O In: 1368 [P.O.:480; I.V.:888] Out: 400 [Urine:400]   Physical Exam: General: A&Ox3, in no resp distress NECK:  has dressings- with serosanguinous darinage. CHEST:  Clinically clear to auscultation, no wheezes, no crackles. HEART: normal S1,S2, regular, no murmurs. ABDOMEN:  Full, soft, non-tender, no palpable organomegaly, no palpable masses, normal bowel sounds. UPPER EXTREMITIES: Left hand and forearm with dressing clean and dry.  LOWER EXTREMITIES:  No edema, palpable peripheral pulses. Dorsum of right foot is swollen, but right ankle is non-tender, and has full ROM. Right 1st MTP -nontender CENTRAL NERVOUS SYSTEM:  No focal neurologic deficit on gross examination.  Lab Results:  [facility] 04/26/12 0555 04/25/12 0602  WBC 6.9 7.8  HGB 8.3* 9.0*  HCT 26.1* 27.2*  PLT 508* 585*    Basename 04/25/12 0602 04/24/12 0515  NA 135 135  K 3.5 3.7  CL 100 100  CO2 25 24  GLUCOSE 104* 121*  BUN 9 7  CREATININE 0.60 0.59  CALCIUM 8.4 8.4   Recent Results (from the past 240 hour(s))  CULTURE, BLOOD (ROUTINE X 2)     Status: Normal   Collection Time   04/17/12  4:00 PM      Component Value Range Status Comment   Specimen Description BLOOD RIGHT HAND   Final    Special Requests BOTTLES DRAWN AEROBIC AND ANAEROBIC 10CC   Final    Culture  Setup Time [id number]   Final    Culture NO GROWTH 5 DAYS   Final    Report Status 04/24/2012 FINAL   Final   CULTURE, BLOOD (ROUTINE X 2)     Status: Normal   Collection  Time   04/17/12  4:05 PM      Component Value Range Status Comment   Specimen Description BLOOD RIGHT HAND   Final    Special Requests BOTTLES DRAWN AEROBIC AND ANAEROBIC 10CC   Final    Culture  Setup Time [id number]   Final    Culture NO GROWTH 5 DAYS   Final    Report Status 04/24/2012 FINAL   Final   SURGICAL PCR SCREEN     Status: Normal   Collection Time   04/17/12  9:32 PM      Component Value Range Status Comment   MRSA, PCR NEGATIVE  NEGATIVE  Final    Staphylococcus aureus NEGATIVE  NEGATIVE  Final   GRAM STAIN     Status: Normal   Collection Time   04/17/12 11:48 PM      Component Value Range Status Comment   Specimen Description ABSCESS NECK RIGHT   Final    Special Requests PATIENT ON FOLLOWING UNASYN   Final    Gram Stain     Final    Value: FEW WBC PRESENT, PREDOMINANTLY PMN CORRECTED ON 04/25 AT 1554: PREVIOUSLY REPORTED AS NO WBC SEEN     RARE GRAM POSITIVE COCCI IN PAIRS IN CHAINS CORRECTED ON 04/25 AT 1555: PREVIOUSLY REPORTED AS NO ORGANISMS SEEN  CALLED [facility] Mendel Corning RN 04/18/12 0040 ACAINJ     REPORT CORRECTED AND CALLED TO BETH BRADT, RN 04/18/12 1555 BY K SCHULTZ   Report Status 04/18/2012 FINAL   Final   ANAEROBIC CULTURE     Status: Normal   Collection Time   04/17/12 11:48 PM      Component Value Range Status Comment   Specimen Description ABSCESS NECK RIGHT   Final    Special Requests PATIENT ON FOLLOWING UNASYN   Final    Gram Stain     Final    Value: FEW WBC PRESENT, PREDOMINANTLY PMN     RARE GRAM POSITIVE COCCI     IN PAIRS IN CHAINS   Culture NO ANAEROBES ISOLATED   Final    Report Status 04/22/2012 FINAL   Final   CULTURE, ROUTINE-ABSCESS     Status: Normal   Collection Time   04/17/12 11:48 PM      Component Value Range Status Comment   Specimen Description ABSCESS NECK RIGHT   Final    Special Requests PATIENT ON FOLLOWING UNASYN   Final    Gram Stain     Final    Value: FEW WBC PRESENT, PREDOMINANTLY PMN     RARE GRAM POSITIVE COCCI       IN PAIRS IN CHAINS Performed at [facility] Gram Stain Report Called to,Read Back By and Verified With: Gram Stain Report Called to,Read Back By and Verified With: [facility] JOHNSON J RN 04/18/12 0040 ACAINJ Gram Stain previously reported as No WBC seen      and No Organisms seen CORRECTED RESULTS CALLED TO: KIM S AT [facility] MICRO [id number] 1300 BY WOODB   Culture RARE GROUP A STREP (S.PYOGENES) ISOLATED   Final    Report Status 04/20/2012 FINAL   Final   CULTURE, ROUTINE-ABSCESS     Status: Normal   Collection Time   04/18/12  9:19 PM      Component Value Range Status Comment   Specimen Description ABSCESS GROIN LEFT   Final    Special Requests PSOAS ASPIRATION   Final    Gram Stain     Final    Value: MODERATE WBC PRESENT,BOTH PMN AND MONONUCLEAR     NO SQUAMOUS EPITHELIAL CELLS SEEN     RARE GRAM POSITIVE COCCI IN PAIRS   Culture NO GROWTH 3 DAYS   Final    Report Status 04/22/2012 FINAL   Final      Studies/Results: No results found.  Medications: Scheduled Meds:    . ampicillin-sulbactam (UNASYN) IV  3 g Intravenous Q6H  . enoxaparin (LOVENOX) injection  40 mg Subcutaneous Q24H  . mupirocin ointment   Topical BID  . nystatin  10 mL Oral QID  . predniSONE  40 mg Oral Q breakfast  . sodium chloride  3 mL Intravenous Q12H   Continuous Infusions:    . sodium chloride 100 mL/hr at 04/26/12 1531   PRN Meds:.acetaminophen, morphine injection, ondansetron (ZOFRAN) IV, ondansetron, oxyCODONE, phenol, sodium chloride  Assessment/Plan:  Active Problems:  1. Bacteremia due to Group A Streptococcus/ Sepsis: Patient had an upper respiratory infection on April 13 for which he sought medical care but did not receive antibiotics and it is presumed he was treated for a viral upper respiratory infection. He failed to improve, subsequently developed more constitutional symptoms and presented to our hospital with current condition. Septic work up revealed Group A streptococcal bacteremia, with  multiple metastatic infective foci. ID consultation was provided  by Dr Paulette Blanch Dam, who was very helpful in rationalizing antibiotic therapy. Patient is currently on Unasyn but will be transitioned to Rocephin monotherapy on discharge. Duration is unclear at this time. 2D Echocardiogram of 04/18/12, showed normal left ventricular cavity size, mild concentric hypertrophy and ejection fraction of 60% to 65%, with no regional wall motion abnormalities. A small to moderate pericardial effusion was identified circumferential to the heart and no vegetations were identified. Dr Dietrich Pates, [place] cardiologist, reviewed the echocardiogram and  in her opinion the effusion is actually small and no tamponade physiology is seen.  -Appreciate ID assistance with antibiotic recommendations. 2. Multiple septic metastatic foci/abscesses and cellulitis: Patient presented with a swollen left hand and arm, as well as generalized pains. Evaluation, revealed multiple foci of infection, including neck abscess with extension into right chest wall/Sterno-clavicular joint and mediastinum, cellulitis and abscess of Left hand/Left Psoas abscess/ Right ankle and thigh swelling with pain and erythema- ? Evolving abscess. ENT consult was provided by Dr Flo Shanks, who performed darinage of neck/mediastinal abscesses, Dr Bradly Bienenstock carried out debridement of LUE and aspiration of right ankle on 04/18/12. Aspiration of left  iliopsoas abscess was performed by Dr Oley Balm,  interventional radiologist, on the same date. Patient had a transaminitis, likely due to sepsis, but this is improving with treatment.   -repeat neck CT this am revealed loculations in low RIGHT neck, intramuscular RIGHT SCM, sub pectoral accumulations -s/p I&D per Dr Lazarus Salines again on 5/1, follow -Continuing unasyn per ID - afebrile with no leukocytosis, follow -to OR in am for repacking or change to primerose drains 3. Acute respiratory failure with  hypoxia/ Pleural effusion, bilateral with compressive atelectasis:  Patient had transient hypoxia, soon after hospitalization and procedures. This was felt multifactorial, secondary to pleural effusions and compressive atelectasis,as well a s oioid analgesics. He was deemed at risk for developing HCAP, but recovered with incentive spirometry and oxygen supplementation. 4. Thrush: An incidental finding. Improving with Nystatin swish and swallow.  5. Anemia:   Patient had a mild microcytic anemia, with Hemoglobin of 11.2 and MCV of 67.1 at presentation. During this hospitalization, hemoglobin has dropped, possibly because of added critical illness anemia. Hematinic studies revealed iron deficiency. -hemoglobin stable, may need outpatient GI workup 6. Gout: Patient has a history of gout, and today, had right 1st MTP joint swelling 4/30, redness and pain.  -continue a 5-day course of oral Prednisone. -improved     LOS: 14 days   Daneisha Surges C 04/26/2012, 4:32 PM

## 2012-04-27 ENCOUNTER — Inpatient Hospital Stay (HOSPITAL_COMMUNITY): Payer: Medicaid Other | Admitting: Anesthesiology

## 2012-04-27 ENCOUNTER — Encounter (HOSPITAL_COMMUNITY): Payer: Self-pay | Admitting: Anesthesiology

## 2012-04-27 ENCOUNTER — Encounter (HOSPITAL_COMMUNITY)
Admission: EM | Disposition: A | Discharge status: Home / Self Care | Payer: Self-pay | Source: Home / Self Care | Attending: Internal Medicine

## 2012-04-27 DIAGNOSIS — L03119 Cellulitis of unspecified part of limb: Secondary | ICD-10-CM

## 2012-04-27 DIAGNOSIS — R7881 Bacteremia: Secondary | ICD-10-CM

## 2012-04-27 DIAGNOSIS — A409 Streptococcal sepsis, unspecified: Secondary | ICD-10-CM

## 2012-04-27 DIAGNOSIS — J9851 Mediastinitis: Secondary | ICD-10-CM

## 2012-04-27 DIAGNOSIS — L02519 Cutaneous abscess of unspecified hand: Secondary | ICD-10-CM

## 2012-04-27 LAB — CBC
MCH: 21.7 pg — ABNORMAL LOW (ref 26.0–34.0)
MCV: 67.3 fL — ABNORMAL LOW (ref 78.0–100.0)
Platelets: 507 10*3/uL — ABNORMAL HIGH (ref 150–400)
RBC: 3.91 MIL/uL — ABNORMAL LOW (ref 4.22–5.81)
RDW: 15.4 % (ref 11.5–15.5)

## 2012-04-27 SURGERY — INCISION AND DRAINAGE, ABSCESS
Anesthesia: General | Wound class: Contaminated

## 2012-04-27 MED ORDER — HYDROMORPHONE HCL PF 1 MG/ML IJ SOLN
0.2500 mg | INTRAMUSCULAR | Status: DC | PRN
  Administered 2012-04-27: 0.5 mg via INTRAVENOUS

## 2012-04-27 MED ORDER — PROPOFOL 10 MG/ML IV EMUL
INTRAVENOUS | Status: DC | PRN
  Administered 2012-04-27: 150 mg via INTRAVENOUS

## 2012-04-27 MED ORDER — SODIUM CHLORIDE 0.9 % IV SOLN
INTRAVENOUS | Status: DC | PRN
  Administered 2012-04-27: 07:00:00 via INTRAVENOUS

## 2012-04-27 MED ORDER — SODIUM CHLORIDE 0.9 % IR SOLN
Status: DC | PRN
  Administered 2012-04-27: 1000 mL

## 2012-04-27 MED ORDER — FENTANYL CITRATE 0.05 MG/ML IJ SOLN
INTRAMUSCULAR | Status: DC | PRN
Start: 2012-04-27 — End: 2012-04-27
  Administered 2012-04-27 (×2): 50 ug via INTRAVENOUS

## 2012-04-27 MED ORDER — MIDAZOLAM HCL 5 MG/5ML IJ SOLN
INTRAMUSCULAR | Status: DC | PRN
  Administered 2012-04-27: 2 mg via INTRAVENOUS

## 2012-04-27 MED ORDER — LIDOCAINE HCL (CARDIAC) 20 MG/ML IV SOLN
INTRAVENOUS | Status: DC | PRN
  Administered 2012-04-27: 25 mg via INTRAVENOUS

## 2012-04-27 SURGICAL SUPPLY — 33 items
BANDAGE CONFORM 2  STR LF (GAUZE/BANDAGES/DRESSINGS) ×2 IMPLANT
BANDAGE GAUZE ELAST BULKY 4 IN (GAUZE/BANDAGES/DRESSINGS) ×2 IMPLANT
BLADE SURG 15 STRL LF DISP TIS (BLADE) ×1 IMPLANT
BLADE SURG 15 STRL SS (BLADE) ×1
CLOTH BEACON ORANGE TIMEOUT ST (SAFETY) ×2 IMPLANT
COVER SURGICAL LIGHT HANDLE (MISCELLANEOUS) ×4 IMPLANT
CRADLE DONUT ADULT HEAD (MISCELLANEOUS) IMPLANT
DRAIN PENROSE 1/2X12 LTX STRL (WOUND CARE) ×6 IMPLANT
DRAIN PENROSE 1/4X12 LTX STRL (WOUND CARE) ×2 IMPLANT
DRSG PAD ABDOMINAL 8X10 ST (GAUZE/BANDAGES/DRESSINGS) IMPLANT
ELECT COATED BLADE 2.86 ST (ELECTRODE) IMPLANT
ELECT REM PT RETURN 9FT ADLT (ELECTROSURGICAL) ×2
ELECTRODE REM PT RTRN 9FT ADLT (ELECTROSURGICAL) ×1 IMPLANT
GAUZE SPONGE 4X4 16PLY XRAY LF (GAUZE/BANDAGES/DRESSINGS) ×2 IMPLANT
GLOVE BIO SURGEON STRL SZ7.5 (GLOVE) ×2 IMPLANT
GOWN STRL NON-REIN LRG LVL3 (GOWN DISPOSABLE) ×2 IMPLANT
KIT BASIN OR (CUSTOM PROCEDURE TRAY) ×2 IMPLANT
KIT ROOM TURNOVER OR (KITS) ×2 IMPLANT
MARKER SKIN DUAL TIP RULER LAB (MISCELLANEOUS) ×2 IMPLANT
NEEDLE HYPO 25GX1X1/2 BEV (NEEDLE) ×2 IMPLANT
NS IRRIG 1000ML POUR BTL (IV SOLUTION) ×2 IMPLANT
PACK SURGICAL SETUP 50X90 (CUSTOM PROCEDURE TRAY) ×2 IMPLANT
PAD ARMBOARD 7.5X6 YLW CONV (MISCELLANEOUS) ×4 IMPLANT
PENCIL BUTTON HOLSTER BLD 10FT (ELECTRODE) IMPLANT
RUBBERBAND STERILE (MISCELLANEOUS) IMPLANT
SPONGE GAUZE 4X4 12PLY (GAUZE/BANDAGES/DRESSINGS) IMPLANT
SUT ETHILON 2 0 FS 18 (SUTURE) ×2 IMPLANT
SUT SILK 2 0 SH CR/8 (SUTURE) IMPLANT
SWAB COLLECTION DEVICE MRSA (MISCELLANEOUS) ×2 IMPLANT
SYR BULB IRRIGATION 50ML (SYRINGE) ×2 IMPLANT
SYR CONTROL 10ML LL (SYRINGE) ×2 IMPLANT
TUBE ANAEROBIC SPECIMEN COL (MISCELLANEOUS) ×2 IMPLANT
TUBE CONNECTING 12X1/4 (SUCTIONS) ×2 IMPLANT

## 2012-04-27 NOTE — Anesthesia Preprocedure Evaluation (Addendum)
Anesthesia Evaluation  Patient identified by MRN, date of birth, ID band Patient awake    Reviewed: Allergy & Precautions, H&P , NPO status , Patient's Chart, lab work & pertinent test results  History of Anesthesia Complications Negative for: history of anesthetic complications  Airway Mallampati: I TM Distance: >3 FB Neck ROM: Full    Dental No notable dental hx. (+) Teeth Intact and Dental Advisory Given   Pulmonary neg pulmonary ROS,  breath sounds clear to auscultation  Pulmonary exam normal       Cardiovascular negative cardio ROS  Rhythm:Regular Rate:Normal     Neuro/Psych negative neurological ROS     GI/Hepatic GERD-  Controlled,Elevation of LFTs when was septic with this cellulitis   Endo/Other  negative endocrine ROS  Renal/GU negative Renal ROS     Musculoskeletal   Abdominal   Peds  Hematology   Anesthesia Other Findings   Reproductive/Obstetrics                          Anesthesia Physical Anesthesia Plan  ASA: II  Anesthesia Plan: General   Post-op Pain Management:    Induction: Intravenous  Airway Management Planned: Oral ETT  Additional Equipment:   Intra-op Plan:   Post-operative Plan: Extubation in OR  Informed Consent: I have reviewed the patients History and Physical, chart, labs and discussed the procedure including the risks, benefits and alternatives for the proposed anesthesia with the patient or authorized representative who has indicated his/her understanding and acceptance.   Dental advisory given  Plan Discussed with: Anesthesiologist, CRNA and Surgeon  Anesthesia Plan Comments: (Plan routine monitors, GETA)       Anesthesia Quick Evaluation

## 2012-04-27 NOTE — Brief Op Note (Signed)
04/12/2012 - 04/27/2012  8:12 AM  PATIENT:  [name]  51 y.o. male  PRE-OPERATIVE DIAGNOSIS:  neck abcess  POST-OPERATIVE DIAGNOSIS:  neck abcess  PROCEDURE:  Procedure(s) (LRB): Right neck wound irrigation and dressing change  SURGEON:  Surgeon(s) and Role:    * Christia Reading, MD - Primary  PHYSICIAN ASSISTANT:   ASSISTANTS: none   ANESTHESIA:   general  EBL:  Total I/O In: 300 [I.V.:300] Out: -   BLOOD ADMINISTERED:none  DRAINS: Penrose drain in the right neck   LOCAL MEDICATIONS USED:  NONE  SPECIMEN:  No Specimen  DISPOSITION OF SPECIMEN:  N/A  COUNTS:  YES  TOURNIQUET:  * No tourniquets in log *  DICTATION: .Other Dictation: Dictation Number [id number]  PLAN OF CARE: Return to hospital room  PATIENT DISPOSITION:  PACU - hemodynamically stable.   Delay start of Pharmacological VTE agent (>24hrs) due to surgical blood loss or risk of bleeding: no

## 2012-04-27 NOTE — Progress Notes (Signed)
Subjective:  From the OR this a.m, denies any complaints.   Objective: Vital signs in last 24 hours: Temp:  [97.8 F (36.6 C)-98.3 F (36.8 C)] 98.2 F (36.8 C) (05/04 0920) Pulse Rate:  [69-96] 96  (05/04 0920) Resp:  [8-18] 18  (05/04 0920) BP: (124-165)/(79-101) 157/93 mmHg (05/04 0920) SpO2:  [94 %-100 %] 95 % (05/04 0920) Weight change:  Last BM Date: 04/27/12  Intake/Output from previous day: 05/03 0701 - 05/04 0700 In: 4465.7 [P.O.:480; I.V.:2285.7; IV Piggyback:1700] Out: 2100 [Urine:2100] Total I/O In: 300 [I.V.:300] Out: -    Physical Exam: General: A&Ox3, in no resp distress NECK:  has dressings- with bloody drainage. CHEST:  Clinically clear to auscultation, no wheezes, no crackles. HEART: normal S1,S2, regular, no murmurs. ABDOMEN:  Full, soft, non-tender, no palpable organomegaly, no palpable masses, normal bowel sounds. UPPER EXTREMITIES: Left hand and forearm with dressing clean and dry.  LOWER EXTREMITIES:  No edema, palpable peripheral pulses. Dorsum of right foot is swollen, but right ankle is non-tender, and has full ROM. Right 1st MTP -nontender CENTRAL NERVOUS SYSTEM:  No focal neurologic deficit on gross examination.  Lab Results:  [facility] 04/27/12 0508 04/26/12 0555  WBC 8.2 6.9  HGB 8.5* 8.3*  HCT 26.3* 26.1*  PLT 507* 508*    Basename 04/25/12 0602  NA 135  K 3.5  CL 100  CO2 25  GLUCOSE 104*  BUN 9  CREATININE 0.60  CALCIUM 8.4   Recent Results (from the past 240 hour(s))  CULTURE, BLOOD (ROUTINE X 2)     Status: Normal   Collection Time   04/17/12  4:00 PM      Component Value Range Status Comment   Specimen Description BLOOD RIGHT HAND   Final    Special Requests BOTTLES DRAWN AEROBIC AND ANAEROBIC 10CC   Final    Culture  Setup Time [id number]   Final    Culture NO GROWTH 5 DAYS   Final    Report Status 04/24/2012 FINAL   Final   CULTURE, BLOOD (ROUTINE X 2)     Status: Normal   Collection Time   04/17/12  4:05 PM    Component Value Range Status Comment   Specimen Description BLOOD RIGHT HAND   Final    Special Requests BOTTLES DRAWN AEROBIC AND ANAEROBIC 10CC   Final    Culture  Setup Time [id number]   Final    Culture NO GROWTH 5 DAYS   Final    Report Status 04/24/2012 FINAL   Final   SURGICAL PCR SCREEN     Status: Normal   Collection Time   04/17/12  9:32 PM      Component Value Range Status Comment   MRSA, PCR NEGATIVE  NEGATIVE  Final    Staphylococcus aureus NEGATIVE  NEGATIVE  Final   GRAM STAIN     Status: Normal   Collection Time   04/17/12 11:48 PM      Component Value Range Status Comment   Specimen Description ABSCESS NECK RIGHT   Final    Special Requests PATIENT ON FOLLOWING UNASYN   Final    Gram Stain     Final    Value: FEW WBC PRESENT, PREDOMINANTLY PMN CORRECTED ON 04/25 AT 1554: PREVIOUSLY REPORTED AS NO WBC SEEN     RARE GRAM POSITIVE COCCI IN PAIRS IN CHAINS CORRECTED ON 04/25 AT 1555: PREVIOUSLY REPORTED AS NO ORGANISMS SEEN     CALLED [facility] Mendel Corning RN 04/18/12 0040 ACAINJ  REPORT CORRECTED AND CALLED TO BETH BRADT, RN 04/18/12 1555 BY K SCHULTZ   Report Status 04/18/2012 FINAL   Final   ANAEROBIC CULTURE     Status: Normal   Collection Time   04/17/12 11:48 PM      Component Value Range Status Comment   Specimen Description ABSCESS NECK RIGHT   Final    Special Requests PATIENT ON FOLLOWING UNASYN   Final    Gram Stain     Final    Value: FEW WBC PRESENT, PREDOMINANTLY PMN     RARE GRAM POSITIVE COCCI     IN PAIRS IN CHAINS   Culture NO ANAEROBES ISOLATED   Final    Report Status 04/22/2012 FINAL   Final   CULTURE, ROUTINE-ABSCESS     Status: Normal   Collection Time   04/17/12 11:48 PM      Component Value Range Status Comment   Specimen Description ABSCESS NECK RIGHT   Final    Special Requests PATIENT ON FOLLOWING UNASYN   Final    Gram Stain     Final    Value: FEW WBC PRESENT, PREDOMINANTLY PMN     RARE GRAM POSITIVE COCCI     IN PAIRS IN CHAINS  Performed at [facility] Gram Stain Report Called to,Read Back By and Verified With: Gram Stain Report Called to,Read Back By and Verified With: [facility] JOHNSON J RN 04/18/12 0040 ACAINJ Gram Stain previously reported as No WBC seen      and No Organisms seen CORRECTED RESULTS CALLED TO: KIM S AT [facility] MICRO [id number] BY WOODB   Culture RARE GROUP A STREP (S.PYOGENES) ISOLATED   Final    Report Status 04/20/2012 FINAL   Final   CULTURE, ROUTINE-ABSCESS     Status: Normal   Collection Time   04/18/12  9:19 PM      Component Value Range Status Comment   Specimen Description ABSCESS GROIN LEFT   Final    Special Requests PSOAS ASPIRATION   Final    Gram Stain     Final    Value: MODERATE WBC PRESENT,BOTH PMN AND MONONUCLEAR     NO SQUAMOUS EPITHELIAL CELLS SEEN     RARE GRAM POSITIVE COCCI IN PAIRS   Culture NO GROWTH 3 DAYS   Final    Report Status 04/22/2012 FINAL   Final      Studies/Results: No results found.  Medications: Scheduled Meds:    . ampicillin-sulbactam (UNASYN) IV  3 g Intravenous Q6H  . enoxaparin (LOVENOX) injection  40 mg Subcutaneous Q24H  . mupirocin ointment   Topical BID  . nystatin  10 mL Oral QID  . predniSONE  40 mg Oral Q breakfast  . sodium chloride  3 mL Intravenous Q12H   Continuous Infusions:    . sodium chloride 100 mL/hr at 04/27/12 0421   PRN Meds:.acetaminophen, morphine injection, ondansetron (ZOFRAN) IV, ondansetron, oxyCODONE, phenol, sodium chloride, DISCONTD:  HYDROmorphone (DILAUDID) injection, DISCONTD: sodium chloride irrigation  Assessment/Plan:  Active Problems:  1. Bacteremia due to Group A Streptococcus/ Sepsis: Patient had an upper respiratory infection on April 13 for which he sought medical care but did not receive antibiotics and it is presumed he was treated for a viral upper respiratory infection. He failed to improve, subsequently developed more constitutional symptoms and presented to our hospital with current condition.  Septic work up revealed Group A streptococcal bacteremia, with multiple metastatic infective foci. ID consultation was provided by Dr Paulette Blanch  Dam, who was very helpful in rationalizing antibiotic therapy. Patient is currently on Unasyn but will be transitioned to Rocephin monotherapy on discharge. Duration is unclear at this time. 2D Echocardiogram of 04/18/12, showed normal left ventricular cavity size, mild concentric hypertrophy and ejection fraction of 60% to 65%, with no regional wall motion abnormalities. A small to moderate pericardial effusion was identified circumferential to the heart and no vegetations were identified. Dr Dietrich Pates, [place] cardiologist, reviewed the echocardiogram and  in her opinion the effusion is actually small and no tamponade physiology is seen.  -Appreciate ID assistance with antibiotic recommendations, continue Unasyn. 2. Multiple septic metastatic foci/abscesses and cellulitis: Patient presented with a swollen left hand and arm, as well as generalized pains. Evaluation, revealed multiple foci of infection, including neck abscess with extension into right chest wall/Sterno-clavicular joint and mediastinum, cellulitis and abscess of Left hand/Left Psoas abscess/ Right ankle and thigh swelling with pain and erythema- ? Evolving abscess. ENT consult was provided by Dr Flo Shanks, who performed darinage of neck/mediastinal abscesses, Dr Bradly Bienenstock carried out debridement of LUE and aspiration of right ankle on 04/18/12. Aspiration of left  iliopsoas abscess was performed by Dr Oley Balm,  interventional radiologist, on the same date. Patient had a transaminitis, likely due to sepsis, but this is improving with treatment.   -repeat neck CT this am revealed loculations in low RIGHT neck, intramuscular RIGHT SCM, sub pectoral accumulations -s/p I&D per Dr Lazarus Salines again on 5/1, follow -Continuing unasyn per ID - afebrile with no leukocytosis, follow - primerose  drains placed today, appreciate ENT assistance. 3. Acute respiratory failure with hypoxia/ Pleural effusion, bilateral with compressive atelectasis:  Patient had transient hypoxia, soon after hospitalization and procedures. This was felt multifactorial, secondary to pleural effusions and compressive atelectasis,as well a s oioid analgesics. He was deemed at risk for developing HCAP, but recovered with incentive spirometry and oxygen supplementation. 4. Thrush: An incidental finding. Improving with Nystatin swish and swallow.  5. Anemia:   Patient had a mild microcytic anemia, with Hemoglobin of 11.2 and MCV of 67.1 at presentation. During this hospitalization, hemoglobin has dropped, possibly because of added critical illness anemia. Hematinic studies revealed iron deficiency. -hemoglobin stable, may need outpatient GI workup 6. Gout: Patient has a history of gout, and today, had right 1st MTP joint swelling 4/30, redness and pain.  -continue a 5-day course of oral Prednisone - day 4. -improved     LOS: 15 days   Kyrsten Deleeuw C 04/27/2012, 11:22 AM

## 2012-04-27 NOTE — Transfer of Care (Signed)
Immediate Anesthesia Transfer of Care Note  Patient: [name]  Procedure(s) Performed: Procedure(s) (LRB): INCISION AND DRAINAGE ABSCESS (N/A)  Patient Location: PACU  Anesthesia Type: General  Level of Consciousness: alert   Airway & Oxygen Therapy: Patient Spontanous Breathing and Patient connected to nasal cannula oxygen  Post-op Assessment: Report given to PACU RN, Post -op Vital signs reviewed and stable and Patient moving all extremities  Post vital signs: Reviewed and stable  Complications: No apparent anesthesia complications

## 2012-04-27 NOTE — Op Note (Signed)
NAMENICHOALS, HEYDE NO.:  [id number]  MEDICAL RECORD NO.:  [id number]  LOCATION:  [facility]                         FACILITY:  [facility]  PHYSICIAN:  Antony Contras, MD     DATE OF BIRTH:  [birth year]  DATE OF PROCEDURE:  04/27/2012 DATE OF DISCHARGE:                              OPERATIVE REPORT   PREOPERATIVE DIAGNOSIS:  Right neck abscess.  POSTOPERATIVE DIAGNOSIS:  Right neck abscess.  PROCEDURE:  Right neck wound irrigation and dressing change.  SURGEON:  Antony Contras, MD  ANESTHESIA:  General and LMA.  COMPLICATIONS:  None.  INDICATION:  The patient is a 50 year old African-American male with an extensive right deep neck  abscess extending onto the chest wall.  He has undergone incision and drainage twice in the last time with packing of soft Kling dressing into the wound.  He is progressing well clinically and needs a dressing change and is brought to the operating room for that change and exchange of the gauze for Penrose drains.  FINDINGS:  The gauze was removed from the wound and the wound was copiously irrigated.  There was some cloudy blood irrigated from the wound but was really fairly clean.  Penrose drains were placed.  DESCRIPTION OF PROCEDURE:  The patient was identified in the holding room and informed consent having been obtained including discussion of risks, benefits, alternatives, the patient was brought to the operative suite and put on the operating table in supine position.  Anesthesia was induced and an LMA was placed without difficulty.  The right neck outer dressing was removed, then the packing material was then pulled out of the wound.  The right neck was then prepped and draped in sterile fashion.  The wound was copiously irrigated with saline and explored with finger with no further loculations or extensions of the abscess cavity able to be performed.  After irrigation, half-inch Penrose drains were then placed totaling 4  drains with 1 down into the mediastinum, 1 superiorly along the larynx, 1 laterally into the zone 5 region, and 1 over the chest wall. Each drain was secured to the surrounding skin using 2-0 nylon suture. After this was completed, the drapes were removed and a Kerlix fluff dressing was applied.  The patient was returned to anesthesia for wake up, was extubated, and moved to recovery room in stable condition.     Antony Contras, MD     DDB/MEDQ  D:  04/27/2012  T:  04/27/2012  Job:  [id number]

## 2012-04-27 NOTE — Anesthesia Postprocedure Evaluation (Signed)
  Anesthesia Post-op Note  Patient: [name]  Procedure(s) Performed: Procedure(s) (LRB): INCISION AND DRAINAGE ABSCESS (N/A)  Patient Location: PACU  Anesthesia Type: General  Level of Consciousness: awake, alert  and oriented  Airway and Oxygen Therapy: Patient Spontanous Breathing  Post-op Pain: none  Post-op Assessment: Post-op Vital signs reviewed, Patient's Cardiovascular Status Stable, Respiratory Function Stable, Patent Airway, No signs of Nausea or vomiting and Pain level controlled  Post-op Vital Signs: Reviewed and stable  Complications: No apparent anesthesia complications

## 2012-04-27 NOTE — Preoperative (Signed)
Beta Blockers   Reason not to administer Beta Blockers:Not Applicable 

## 2012-04-28 DIAGNOSIS — L02519 Cutaneous abscess of unspecified hand: Secondary | ICD-10-CM

## 2012-04-28 DIAGNOSIS — A409 Streptococcal sepsis, unspecified: Secondary | ICD-10-CM

## 2012-04-28 DIAGNOSIS — J9851 Mediastinitis: Secondary | ICD-10-CM

## 2012-04-28 DIAGNOSIS — L03119 Cellulitis of unspecified part of limb: Secondary | ICD-10-CM

## 2012-04-28 DIAGNOSIS — R7881 Bacteremia: Secondary | ICD-10-CM

## 2012-04-28 MED ORDER — CLONIDINE HCL 0.1 MG PO TABS
0.1000 mg | ORAL_TABLET | Freq: Four times a day (QID) | ORAL | Status: DC | PRN
  Administered 2012-04-28 – 2012-04-29 (×2): 0.1 mg via ORAL
  Filled 2012-04-28 (×4): qty 1

## 2012-04-28 NOTE — Progress Notes (Signed)
Subjective:  denies any new complaints.   Objective: Vital signs in last 24 hours: Temp:  [98.2 F (36.8 C)-99.9 F (37.7 C)] 99.5 F (37.5 C) (05/05 0547) Pulse Rate:  [69-103] 85  (05/05 0547) Resp:  [8-20] 18  (05/05 0547) BP: (142-189)/(76-95) 174/76 mmHg (05/05 0547) SpO2:  [93 %-100 %] 93 % (05/05 0547) Weight change:  Last BM Date: 04/27/12  Intake/Output from previous day: 05/04 0701 - 05/05 0700 In: 4159 [P.O.:1760; I.V.:2399] Out: 4850 [Urine:4850] Total I/O In: -  Out: 900 [Urine:900]   Physical Exam: General: A&Ox3, in no resp distress NECK:  has dressings- with serosanguinous drainage. CHEST:  Clinically clear to auscultation, no wheezes, no crackles. HEART: normal S1,S2, regular, no murmurs. ABDOMEN:  Full, soft, non-tender, no palpable organomegaly, no palpable masses, normal bowel sounds. UPPER EXTREMITIES: Left hand and forearm with dressing clean and dry.  LOWER EXTREMITIES:  No edema, palpable peripheral pulses. Dorsum of right foot is swollen, but right ankle is non-tender, and has full ROM. Right 1st MTP -nontender CENTRAL NERVOUS SYSTEM:  No focal neurologic deficit on gross examination.  Lab Results:  Basename 04/27/12 0508 04/26/12 0555  WBC 8.2 6.9  HGB 8.5* 8.3*  HCT 26.3* 26.1*  PLT 507* 508*   No results found for this basename: NA:2,K:2,CL:2,CO2:2,GLUCOSE:2,BUN:2,CREATININE:2,CALCIUM:2 in the last 72 hours Recent Results (from the past 240 hour(s))  CULTURE, ROUTINE-ABSCESS     Status: Normal   Collection Time   04/18/12  9:19 PM      Component Value Range Status Comment   Specimen Description ABSCESS GROIN LEFT   Final    Special Requests PSOAS ASPIRATION   Final    Gram Stain     Final    Value: MODERATE WBC PRESENT,BOTH PMN AND MONONUCLEAR     NO SQUAMOUS EPITHELIAL CELLS SEEN     RARE GRAM POSITIVE COCCI IN PAIRS   Culture NO GROWTH 3 DAYS   Final    Report Status 04/22/2012 FINAL   Final      Studies/Results: No results  found.  Medications: Scheduled Meds:    . ampicillin-sulbactam (UNASYN) IV  3 g Intravenous Q6H  . enoxaparin (LOVENOX) injection  40 mg Subcutaneous Q24H  . mupirocin ointment   Topical BID  . nystatin  10 mL Oral QID  . predniSONE  40 mg Oral Q breakfast  . sodium chloride  3 mL Intravenous Q12H   Continuous Infusions:    . sodium chloride 100 mL/hr at 04/28/12 0424   PRN Meds:.acetaminophen, cloNIDine, morphine injection, ondansetron (ZOFRAN) IV, ondansetron, oxyCODONE, phenol, sodium chloride, DISCONTD:  HYDROmorphone (DILAUDID) injection  Assessment/Plan:  Active Problems:  1. Bacteremia due to Group A Streptococcus/ Sepsis: Patient had an upper respiratory infection on April 13 for which he sought medical care but did not receive antibiotics and it is presumed he was treated for a viral upper respiratory infection. He failed to improve, subsequently developed more constitutional symptoms and presented to our hospital with current condition. Septic work up revealed Group A streptococcal bacteremia, with multiple metastatic infective foci. ID consultation was provided by Dr Acey Lav, who was very helpful in rationalizing antibiotic therapy. Patient is currently on Unasyn but will be transitioned to Rocephin monotherapy on discharge. Duration is unclear at this time. 2D Echocardiogram of 04/18/12, showed normal left ventricular cavity size, mild concentric hypertrophy and ejection fraction of 60% to 65%, with no regional wall motion abnormalities. A small to moderate pericardial effusion was identified circumferential to the heart  and no vegetations were identified. Dr Dietrich Pates, [place] cardiologist, reviewed the echocardiogram and  in her opinion the effusion is actually small and no tamponade physiology is seen.  -Appreciate ID assistance with antibiotic recommendations, continue Unasyn. 2. Multiple septic metastatic foci/abscesses and cellulitis: Patient presented with a  swollen left hand and arm, as well as generalized pains. Evaluation, revealed multiple foci of infection, including neck abscess with extension into right chest wall/Sterno-clavicular joint and mediastinum, cellulitis and abscess of Left hand/Left Psoas abscess/ Right ankle and thigh swelling with pain and erythema- ? Evolving abscess. ENT consult was provided by Dr Flo Shanks, who performed darinage of neck/mediastinal abscesses, Dr Bradly Bienenstock carried out debridement of LUE and aspiration of right ankle on 04/18/12. Aspiration of left  iliopsoas abscess was performed by Dr Oley Balm,  interventional radiologist, on the same date. Patient had a transaminitis, likely due to sepsis, but this is improving with treatment.   -repeat neck CT this am revealed loculations in low RIGHT neck, intramuscular RIGHT SCM, sub pectoral accumulations -s/p I&D per Dr Lazarus Salines again on 5/1, follow -Continuing unasyn per ID - afebrile with no leukocytosis, follow - primerose drains placed today, appreciate ENT assistance. 3. Acute respiratory failure with hypoxia/ Pleural effusion, bilateral with compressive atelectasis:  Patient had transient hypoxia, soon after hospitalization and procedures. This was felt multifactorial, secondary to pleural effusions and compressive atelectasis,as well a s oioid analgesics. He was deemed at risk for developing HCAP, but recovered with incentive spirometry and oxygen supplementation. 4. Thrush: An incidental finding. Improving with Nystatin swish and swallow.  5. Anemia:   Patient had a mild microcytic anemia, with Hemoglobin of 11.2 and MCV of 67.1 at presentation. During this hospitalization, hemoglobin has dropped, possibly because of added critical illness anemia. Hematinic studies revealed iron deficiency. -hemoglobin stable, may need outpatient GI workup 6. Gout: Patient has a history of gout, and today, had right 1st MTP joint swelling 4/30, redness and pain.  -continue a  5-day course of oral Prednisone - day 4. -improved  7. Elevated blood pressure without prior history of hypertension -Treat his pain adequately -And when necessary clonidine, continue monitoring.   LOS: 16 days   Tonee Silverstein C 04/28/2012, 8:45 AM

## 2012-04-28 NOTE — Progress Notes (Signed)
1 Day Post-Op  Subjective: Doing well.  No complaints.  Objective: Vital signs in last 24 hours: Temp:  [98.3 F (36.8 C)-99.9 F (37.7 C)] 98.5 F (36.9 C) (05/05 0945) Pulse Rate:  [79-103] 88  (05/05 0945) Resp:  [18-20] 20  (05/05 0945) BP: (170-189)/(76-95) 181/89 mmHg (05/05 0945) SpO2:  [93 %-99 %] 99 % (05/05 0945) Last BM Date: 04/27/12  Intake/Output from previous day: 05/04 0701 - 05/05 0700 In: 4159 [P.O.:1760; I.V.:2399] Out: 4850 [Urine:4850] Intake/Output this shift: Total I/O In: 480 [P.O.:480] Out: 2600 [Urine:2600]  General appearance: alert, cooperative and no distress Neck: Dressing changed.  Copious pus on dressing.  Penrose drains in place.  I did not adjust them today.  Chest wall and neck without fullness.  Tender around wound.  Lab Results:   Basename 04/27/12 0508 04/26/12 0555  WBC 8.2 6.9  HGB 8.5* 8.3*  HCT 26.3* 26.1*  PLT 507* 508*   BMET No results found for this basename: NA:2,K:2,CL:2,CO2:2,GLUCOSE:2,BUN:2,CREATININE:2,CALCIUM:2 in the last 72 hours PT/INR No results found for this basename: LABPROT:2,INR:2 in the last 72 hours ABG No results found for this basename: PHART:2,PCO2:2,PO2:2,HCO3:2 in the last 72 hours  Studies/Results: No results found.  Anti-infectives: Anti-infectives     Start     Dose/Rate Route Frequency Ordered Stop   04/17/12 1600   clindamycin (CLEOCIN) IVPB 900 mg  Status:  Discontinued        900 mg 100 mL/hr over 30 Minutes Intravenous Every 8 hours 04/17/12 1454 04/21/12 1249   04/17/12 1600   Ampicillin-Sulbactam (UNASYN) 3 g in sodium chloride 0.9 % 100 mL IVPB        3 g 100 mL/hr over 60 Minutes Intravenous Every 6 hours 04/17/12 1455     04/14/12 1400   ceFAZolin (ANCEF) IVPB 1 g/50 mL premix  Status:  Discontinued        1 g 100 mL/hr over 30 Minutes Intravenous 3 times per day 04/14/12 1057 04/17/12 1426   04/13/12 1900   vancomycin (VANCOCIN) 500 mg in sodium chloride 0.9 % 100 mL IVPB   Status:  Discontinued        500 mg 100 mL/hr over 60 Minutes Intravenous Every 12 hours 04/13/12 1601 04/14/12 1058   04/13/12 0815   moxifloxacin (AVELOX) IVPB 400 mg        400 mg 250 mL/hr over 60 Minutes Intravenous  Once 04/13/12 0805 04/13/12 0953   04/13/12 0500   vancomycin (VANCOCIN) IVPB 1000 mg/200 mL premix  Status:  Discontinued        1,000 mg 200 mL/hr over 60 Minutes Intravenous Every 12 hours 04/13/12 0453 04/13/12 1414   04/13/12 0445   vancomycin (VANCOCIN) IVPB 1000 mg/200 mL premix        1,000 mg 200 mL/hr over 60 Minutes Intravenous  Once 04/13/12 0442 04/13/12 0607          Assessment/Plan: s/p Procedure(s) (LRB): INCISION AND DRAINAGE ABSCESS (N/A) Dressing changed.  Afebrile with normal white blood count.  Will start to advance Penrose drains out in next couple of days.  LOS: 16 days    [name] 04/28/2012

## 2012-04-28 NOTE — Progress Notes (Signed)
Subjective: 1 Day Post-Op Procedure(s) (LRB): INCISION AND DRAINAGE ABSCESS (N/A) Patient reports pain as mild.   Denies CP or SOB.  Voiding without difficulty. Positive flatus. Objective: Vital signs in last 24 hours: Temp:  [98.2 F (36.8 C)-99.9 F (37.7 C)] 99.5 F (37.5 C) (05/05 0547) Pulse Rate:  [69-103] 85  (05/05 0547) Resp:  [8-20] 18  (05/05 0547) BP: (135-189)/(76-95) 174/76 mmHg (05/05 0547) SpO2:  [93 %-100 %] 93 % (05/05 0547)  Intake/Output from previous day: 05/04 0701 - 05/05 0700 In: 4159 [P.O.:1760; I.V.:2399] Out: 4850 [Urine:4850] Intake/Output this shift:     Basename 04/27/12 0508 04/26/12 0555  HGB 8.5* 8.3*    Basename 04/27/12 0508 04/26/12 0555  WBC 8.2 6.9  RBC 3.91* 3.90*  HCT 26.3* 26.1*  PLT 507* 508*   No results found for this basename: NA:2,K:2,CL:2,CO2:2,BUN:2,CREATININE:2,GLUCOSE:2,CALCIUM:2 in the last 72 hours No results found for this basename: LABPT:2,INR:2 in the last 72 hours  Neurologically intact Neurovascular intact Sensation intact distally Compartment soft Much improved form ortho standpoint No pain with ROM of hip or foot and ankle  Assessment/Plan: 1 Day Post-Op Procedure(s) (LRB): INCISION AND DRAINAGE ABSCESS (N/A) Will sign off at this point if any acute needs please call  [name] R. 04/28/2012, 8:22 AM

## 2012-04-29 DIAGNOSIS — R7881 Bacteremia: Secondary | ICD-10-CM

## 2012-04-29 DIAGNOSIS — L03119 Cellulitis of unspecified part of limb: Secondary | ICD-10-CM

## 2012-04-29 DIAGNOSIS — A419 Sepsis, unspecified organism: Secondary | ICD-10-CM

## 2012-04-29 DIAGNOSIS — J9851 Mediastinitis: Secondary | ICD-10-CM

## 2012-04-29 DIAGNOSIS — L02519 Cutaneous abscess of unspecified hand: Secondary | ICD-10-CM

## 2012-04-29 DIAGNOSIS — A409 Streptococcal sepsis, unspecified: Principal | ICD-10-CM

## 2012-04-29 LAB — BASIC METABOLIC PANEL
BUN: 5 mg/dL — ABNORMAL LOW (ref 6–23)
CO2: 29 mEq/L (ref 19–32)
Calcium: 8.8 mg/dL (ref 8.4–10.5)
Chloride: 99 mEq/L (ref 96–112)
Creatinine, Ser: 0.71 mg/dL (ref 0.50–1.35)
Glucose, Bld: 100 mg/dL — ABNORMAL HIGH (ref 70–99)

## 2012-04-29 LAB — CBC
HCT: 27.3 % — ABNORMAL LOW (ref 39.0–52.0)
MCH: 21.8 pg — ABNORMAL LOW (ref 26.0–34.0)
MCHC: 32.2 g/dL (ref 30.0–36.0)
MCV: 67.6 fL — ABNORMAL LOW (ref 78.0–100.0)
Platelets: 485 10*3/uL — ABNORMAL HIGH (ref 150–400)
RDW: 15.4 % (ref 11.5–15.5)
WBC: 9.1 10*3/uL (ref 4.0–10.5)

## 2012-04-29 MED ORDER — COLCHICINE 0.6 MG PO TABS
0.6000 mg | ORAL_TABLET | Freq: Two times a day (BID) | ORAL | Status: DC
  Administered 2012-04-29 – 2012-05-07 (×16): 0.6 mg via ORAL
  Filled 2012-04-29 (×20): qty 1

## 2012-04-29 NOTE — Progress Notes (Addendum)
Occupational Therapy Treatment Patient Details Name: [name] MRN: [id number] DOB: [birth year] Today's Date: 04/29/2012 Time: 4098-1191 OT Time Calculation (min): 25 min  OT Assessment / Plan / Recommendation Comments on Treatment Session Pt agreeable to LUE and LLE ROM exercises but declined ambulation today    Follow Up Recommendations  Home health OT    Equipment Recommendations  3 in 1 bedside comode    Frequency     Plan Discharge plan remains appropriate    Precautions / Restrictions Precautions Precautions: Fall Restrictions LUE Weight Bearing: Weight bear through elbow only   Pertinent Vitals/Pain Pt c/o 8.5/10 pain in rt neck and LUE. Pt called for RN    ADL  Ambulation Related to ADLs: Pt took side-steps up towards [facility] with Min A ADL Comments: Pt c/o LLE, LUE and Rt neck pain. Pt refuses OOB at this time secondary to gout pain in LLE therefore performed LUE exercises. Also, pt c/o pain/stiffness in Lt inner thigh, therefore performed ROM and stretching to LLE, focusing on inner thigh.    OT Goals Arm Goals Arm Goal: PROM - Progress: Met Arm Goal: Additional Goal #1 - Progress: Progressing toward goals  Visit Information  Last OT Received On: 04/29/12 Assistance Needed: +1    Subjective Data      Prior Functioning       Cognition  Overall Cognitive Status: Appears within functional limits for tasks assessed/performed Behavior During Session: [name] for tasks performed    Mobility Bed Mobility Sit to Supine: 5: Supervision;HOB flat;With rail Details for Bed Mobility Assistance: cues for sequencing (originally pt trying to pull on rail with his left upper extremity)  Transfers Sit to Stand: 5: Supervision;From bed;With upper extremity assist Stand to Sit: 5: Supervision;To bed;Without upper extremity assist Details for Transfer Assistance: cues for safe hand placement   Exercises General Exercises - Upper Extremity Shoulder Flexion: AROM;10  reps;Seated;Left Shoulder Horizontal ABduction: AROM;10 reps;Seated;Left Shoulder Horizontal ADduction: AROM;Left;10 reps;Seated Elbow Flexion: AROM;10 reps;Seated Digit Composite Flexion: AAROM;10 reps;Seated;Left General Exercises - Lower Extremity Ankle Circles/Pumps: AAROM;Right;10 reps;Supine (AROM, 10 reps, left) Long Arc Quad: AROM;Both;20 reps;Seated Hand Exercises Forearm Supination: AROM;10 reps;Left;Seated Forearm Pronation: AROM;Left;10 reps;Seated (unable to achieve full pronation) Thumb Abduction: AAROM;10 reps;Left;Seated Thumb Adduction: AAROM;Left;10 reps;Seated Other Exercises Other Exercises: Provided ROM and stretching exercises to LLE, focusing on inner thigh for use with LB ADLs (pt unable to cross ankle over knee secondary to pain/stiffness in Rt inner thigh)  Balance    End of Session OT - End of Session Activity Tolerance: Patient limited by pain Patient left: in bed;with call bell/phone within reach   [name] 04/29/2012, 3:27 PM

## 2012-04-29 NOTE — Progress Notes (Signed)
Physical Therapy Treatment Patient Details Name: [name] MRN: [id number] DOB: 01-26-1961 Today's Date: 04/29/2012 Time: 1191-4782 PT Time Calculation (min): 26 min  PT Assessment / Plan / Recommendation Comments on Treatment Session  Encouraged more mobility. Painful toe so did not practice steps. Good mobility otherwise.     Follow Up Recommendations  No PT follow up    Equipment Recommendations  3 in 1 bedside comode (left platform RW)    Frequency Min 3X/week   Plan Frequency needs to be updated;Discharge plan remains appropriate    Precautions / Restrictions         Mobility  Bed Mobility Sit to Supine: 5: Supervision;HOB elevated (30 degrees) Details for Bed Mobility Assistance: cues for sequencing (originally pt trying to pull on rail with his left upper extremity)  Transfers Transfers: Sit to Stand;Stand to Sit Sit to Stand: 5: Supervision;With upper extremity assist;From bed Stand to Sit: 5: Supervision;With upper extremity assist;To chair/3-in-1 Details for Transfer Assistance: cues for safe hand placement Ambulation/Gait Ambulation/Gait Assistance: 5: Supervision Ambulation Distance (Feet): 275 Feet Assistive device: Left platform walker Ambulation/Gait Assistance Details: slow, decreased hip and knee flexion bilaterally Gait Pattern: Decreased step length - left;Decreased step length - right;Decreased hip/knee flexion - right;Decreased hip/knee flexion - left;Decreased dorsiflexion - right Stairs Assistance Details (indicate cue type and reason): pt's foot too painful today to try stairs    Exercises General Exercises - Lower Extremity Ankle Circles/Pumps: AAROM;Right;10 reps;Supine (AROM, 10 reps, left) Long Arc Quad: AROM;Both;20 reps;Seated   PT Goals Acute Rehab PT Goals PT Goal: Sit to Stand - Progress: Progressing toward goal PT Goal: Stand to Sit - Progress: Progressing toward goal PT Transfer Goal: Bed to Chair/Chair to Bed - Progress:  Progressing toward goal PT Goal: Ambulate - Progress: Progressing toward goal PT Goal: Perform Home Exercise Program - Progress: Progressing toward goal  Visit Information  Last PT Received On: 04/29/12    Subjective Data  Subjective: I knew you'd be by today.    Cognition  Overall Cognitive Status: Appears within functional limits for tasks assessed/performed Behavior During Session: [facility] for tasks performed    Balance     End of Session PT - End of Session Equipment Utilized During Treatment: Gait belt Activity Tolerance: Patient tolerated treatment well Patient left: in chair;with call bell/phone within reach    [facility] HELEN 04/29/2012, 12:16 PM

## 2012-04-29 NOTE — Progress Notes (Signed)
Subjective:  Complaining of recurrence of right great toe pain.   Objective: Vital signs in last 24 hours: Temp:  [98.4 F (36.9 C)-99.6 F (37.6 C)] 99.4 F (37.4 C) (05/06 1749) Pulse Rate:  [90-106] 94  (05/06 1749) Resp:  [17-20] 17  (05/06 1749) BP: (144-184)/(75-90) 168/84 mmHg (05/06 1756) SpO2:  [95 %-100 %] 100 % (05/06 1749) Weight change:  Last BM Date: 04/27/12  Intake/Output from previous day: 05/05 0701 - 05/06 0700 In: 2600 [P.O.:480; I.V.:2120] Out: 4800 [Urine:4800] Total I/O In: 701.7 [I.V.:701.7] Out: -    Physical Exam: General: A&Ox3, in no resp distress NECK:  has dressings- with serosanguinous drainage. CHEST:  Clinically clear to auscultation, no wheezes, no crackles. HEART: normal S1,S2, regular, no murmurs. ABDOMEN:  Full, soft, non-tender, no palpable organomegaly, no palpable masses, normal bowel sounds. UPPER EXTREMITIES: Left hand and forearm with dressing clean and dry.  LOWER EXTREMITIES:  No edema, palpable peripheral pulses.,right ankle is non-tender, and has full ROM. Right 1st MTP -tender,swollen. CENTRAL NERVOUS SYSTEM:  No focal neurologic deficit on gross examination.  Lab Results:  Basename 04/29/12 0515 04/27/12 0508  WBC 9.1 8.2  HGB 8.8* 8.5*  HCT 27.3* 26.3*  PLT 485* 507*    Basename 04/29/12 0515  NA 135  K 3.7  CL 99  CO2 29  GLUCOSE 100*  BUN 5*  CREATININE 0.71  CALCIUM 8.8   No results found for this or any previous visit (from the past 240 hour(s)).   Studies/Results: No results found.  Medications: Scheduled Meds:    . ampicillin-sulbactam (UNASYN) IV  3 g Intravenous Q6H  . colchicine  0.6 mg Oral BID  . enoxaparin (LOVENOX) injection  40 mg Subcutaneous Q24H  . mupirocin ointment   Topical BID  . nystatin  10 mL Oral QID  . sodium chloride  3 mL Intravenous Q12H   Continuous Infusions:    . sodium chloride 50 mL/hr at 04/29/12 1740   PRN Meds:.acetaminophen, cloNIDine, morphine  injection, ondansetron (ZOFRAN) IV, ondansetron, oxyCODONE, phenol, sodium chloride  Assessment/Plan:  Active Problems:  1. Bacteremia due to Group A Streptococcus/ Sepsis: Patient had an upper respiratory infection on April 13 for which he sought medical care but did not receive antibiotics and it is presumed he was treated for a viral upper respiratory infection. He failed to improve, subsequently developed more constitutional symptoms and presented to our hospital with current condition. Septic work up revealed Group A streptococcal bacteremia, with multiple metastatic infective foci. ID consultation was provided by Dr Acey Lav, who was very helpful in rationalizing antibiotic therapy. Patient is currently on Unasyn but will be transitioned to Rocephin monotherapy on discharge. Duration is unclear at this time. 2D Echocardiogram of 04/18/12, showed normal left ventricular cavity size, mild concentric hypertrophy and ejection fraction of 60% to 65%, with no regional wall motion abnormalities. A small to moderate pericardial effusion was identified circumferential to the heart and no vegetations were identified. Dr Dietrich Pates, [place] cardiologist, reviewed the echocardiogram and  in her opinion the effusion is actually small and no tamponade physiology is seen.  -Appreciate ID assistance with antibiotic recommendations, continue Unasyn. 2. Multiple septic metastatic foci/abscesses and cellulitis: Patient presented with a swollen left hand and arm, as well as generalized pains. Evaluation, revealed multiple foci of infection, including neck abscess with extension into right chest wall/Sterno-clavicular joint and mediastinum, cellulitis and abscess of Left hand/Left Psoas abscess/ Right ankle and thigh swelling with pain and erythema- ? Evolving  abscess. ENT consult was provided by Dr Flo Shanks, who performed darinage of neck/mediastinal abscesses, Dr Bradly Bienenstock carried out debridement of LUE  and aspiration of right ankle on 04/18/12. Aspiration of left  iliopsoas abscess was performed by Dr Oley Balm,  interventional radiologist, on the same date. Patient had a transaminitis, likely due to sepsis, but this is improving with treatment.   -repeat neck CT this am revealed loculations in low RIGHT neck, intramuscular RIGHT SCM, sub pectoral accumulations -s/p I&D per Dr Lazarus Salines again  5/1, follow -Continuing unasyn per ID - afebrile with no leukocytosis, follow - per ENT note still  pus with dressing changes, and drains to be advanced, follow. 3. Acute respiratory failure with hypoxia/ Pleural effusion, bilateral with compressive atelectasis:  Patient had transient hypoxia, soon after hospitalization and procedures. This was felt multifactorial, secondary to pleural effusions and compressive atelectasis,as well a s oioid analgesics. He was deemed at risk for developing HCAP, but recovered with incentive spirometry and oxygen supplementation. 4. Thrush: An incidental finding. Improving with Nystatin swish and swallow.  5. Anemia:   Patient had a mild microcytic anemia, with Hemoglobin of 11.2 and MCV of 67.1 at presentation. During this hospitalization, hemoglobin has dropped, possibly because of added critical illness anemia. Hematinic studies revealed iron deficiency. -hemoglobin stable, may need outpatient GI workup 6. Gout: Patient has a history of gout, and on 4/30, had right 1st MTP joint swelling , redness and pain.  -Received 5-day course of oral Prednisone -completed on 5/5, pain and swelling was improved while on prednisone -But this a.m. began having pain and swelling again-will place on culture sent and follow, as per ID note will closely monitor.  7. Elevated blood pressure without prior history of hypertension -Treat  pain adequately, prednisone was also contributing. -Continue clonidine, continue monitoring and further treat accordingly.  LOS: 17 days   [name]  C 04/29/2012, 6:11 PM

## 2012-04-29 NOTE — Progress Notes (Addendum)
INFECTIOUS DISEASE PROGRESS NOTE  ID: [name] is a 51 y.o. male with   Active Problems:  Cellulitis and abscess of hand - LEFT  Bacteremia due to Group A Streptococcus  Neck abscess with extension into right chest wall and mediastinum  Psoas abscess, left  Leukocytosis  Transaminitis- ? SHOCK LIVER  SIRS (systemic inflammatory response syndrome)  Pleural effusion, bilateral with compressive atelectasis  Anemia- ? hemodilution  Hypokalemia  Acute respiratory failure with hypoxia  Right ankle swelling with pain and erythema  Subjective: Without complaints, in good spirits. Has had some pain and swelling in his R great toe (he believes that this is gout)  Abtx:  Anti-infectives     Start     Dose/Rate Route Frequency Ordered Stop   04/17/12 1600   clindamycin (CLEOCIN) IVPB 900 mg  Status:  Discontinued        900 mg 100 mL/hr over 30 Minutes Intravenous Every 8 hours 04/17/12 1454 04/21/12 1249   04/17/12 1600  Ampicillin-Sulbactam (UNASYN) 3 g in sodium chloride 0.9 % 100 mL IVPB       3 g 100 mL/hr over 60 Minutes Intravenous Every 6 hours 04/17/12 1455     04/14/12 1400   ceFAZolin (ANCEF) IVPB 1 g/50 mL premix  Status:  Discontinued        1 g 100 mL/hr over 30 Minutes Intravenous 3 times per day 04/14/12 1057 04/17/12 1426   04/13/12 1900   vancomycin (VANCOCIN) 500 mg in sodium chloride 0.9 % 100 mL IVPB  Status:  Discontinued        500 mg 100 mL/hr over 60 Minutes Intravenous Every 12 hours 04/13/12 1601 04/14/12 1058   04/13/12 0815   moxifloxacin (AVELOX) IVPB 400 mg        400 mg 250 mL/hr over 60 Minutes Intravenous  Once 04/13/12 0805 04/13/12 0953   04/13/12 0500   vancomycin (VANCOCIN) IVPB 1000 mg/200 mL premix  Status:  Discontinued        1,000 mg 200 mL/hr over 60 Minutes Intravenous Every 12 hours 04/13/12 0453 04/13/12 1414   04/13/12 0445   vancomycin (VANCOCIN) IVPB 1000 mg/200 mL premix        1,000 mg 200 mL/hr over 60 Minutes  Intravenous  Once 04/13/12 0442 04/13/12 0607          Medications:  Scheduled:   . ampicillin-sulbactam (UNASYN) IV  3 g Intravenous Q6H  . enoxaparin (LOVENOX) injection  40 mg Subcutaneous Q24H  . mupirocin ointment   Topical BID  . nystatin  10 mL Oral QID  . sodium chloride  3 mL Intravenous Q12H    Objective: Vital signs in last 24 hours: Temp:  [98.4 F (36.9 C)-99.6 F (37.6 C)] 99.4 F (37.4 C) (05/06 1340) Pulse Rate:  [90-106] 106  (05/06 1340) Resp:  [18-20] 20  (05/06 1340) BP: (144-184)/(75-90) 155/87 mmHg (05/06 1340) SpO2:  [95 %-98 %] 95 % (05/06 1340)   Neck: dressing in place Resp: clear to auscultation bilaterally Cardio: regular rate and rhythm GI: normal findings: bowel sounds normal and soft, non-tender Extremities: R great toe is swollen, tender.   Lab Results  Basename 04/29/12 0515 04/27/12 0508  WBC 9.1 8.2  HGB 8.8* 8.5*  HCT 27.3* 26.3*  NA 135 --  K 3.7 --  CL 99 --  CO2 29 --  BUN 5* --  CREATININE 0.71 --  GLU -- --   Liver Panel No results found for this  basename: PROT:2,ALBUMIN:2,AST:2,ALT:2,ALKPHOS:2,BILITOT:2,BILIDIR:2,IBILI:2 in the last 72 hours Sedimentation Rate No results found for this basename: ESRSEDRATE in the last 72 hours C-Reactive Protein No results found for this basename: CRP:2 in the last 72 hours  Microbiology: No results found for this or any previous visit (from the past 240 hour(s)).  Studies/Results: No results found.   Assessment/Plan: Group A Strep bacteremia, metastatic foci (neck abscess, R sternocleidomastoid/mediastinum/ R ankle cellulitis/myofascitis, L forearm, L groin)  Day 16 anbx (unasyn)  Had debridement of L wrist, R lower neck/mediastinum/chest wall 04-24-12. Repeat neck I & D 04-27-12 Would continue his current anbx, plan for 28 days minimum depending on response.  Watch his R great toe closely.... Agree with switch to ceftriaxone at d/c.    Johny Sax Infectious  Diseases [phone] 04/29/2012, 2:17 PM   LOS: 17 days

## 2012-04-29 NOTE — Progress Notes (Signed)
Nutrition Follow-up  Diet Order:  Regular  Pt intake has improved to 100% since diet advancement.  Pt feels he is eating per his usual.  Pt reports family is bringing foods from home occasionally per his request.  Pt requests fruit as snacks daily.  Meds: Scheduled Meds:   . ampicillin-sulbactam (UNASYN) IV  3 g Intravenous Q6H  . enoxaparin (LOVENOX) injection  40 mg Subcutaneous Q24H  . mupirocin ointment   Topical BID  . nystatin  10 mL Oral QID  . sodium chloride  3 mL Intravenous Q12H   Continuous Infusions:   . sodium chloride 50 mL/hr at 04/29/12 1146   PRN Meds:.acetaminophen, cloNIDine, morphine injection, ondansetron (ZOFRAN) IV, ondansetron, oxyCODONE, phenol, sodium chloride  Labs:  CMP     Component Value Date/Time   NA 135 04/29/2012 0515   K 3.7 04/29/2012 0515   CL 99 04/29/2012 0515   CO2 29 04/29/2012 0515   GLUCOSE 100* 04/29/2012 0515   BUN 5* 04/29/2012 0515   CREATININE 0.71 04/29/2012 0515   CALCIUM 8.8 04/29/2012 0515   PROT 7.3 04/24/2012 0515   ALBUMIN 1.7* 04/24/2012 0515   AST 133* 04/24/2012 0515   ALT 85* 04/24/2012 0515   ALKPHOS 144* 04/24/2012 0515   BILITOT 0.7 04/24/2012 0515   GFRNONAA >90 04/29/2012 0515   GFRAA >90 04/29/2012 0515     Intake/Output Summary (Last 24 hours) at 04/29/12 1206 Last data filed at 04/29/12 0700  Gross per 24 hour  Intake   2360 ml  Output   2700 ml  Net   -340 ml    Weight Status:  No new wt  Re-statement of needs: 1700-1830 kcal, 76-91g protein  Nutrition Dx:  Increased nutrient needs, ongoing  Intervention:   1.  Meal/snacks; RD to order daily snacks for pt.  Pt prefers raw fruits and vegetables, and low sodium foods.  Monitor:  1.  Food/Beverage; PO intake >75% of meals and snacks. Met, continue.   Hoyt Koch Pager #:  [phone]

## 2012-04-29 NOTE — Progress Notes (Signed)
04/29/2012 12:49 PM  [name], [name] [id number]  Post-Op Day 5/12    Temp:  [98.4 F (36.9 C)-99.6 F (37.6 C)] 99.1 F (37.3 C) (05/06 0948) Pulse Rate:  [90-98] 90  (05/06 0948) Resp:  [18-20] 20  (05/06 0948) BP: (144-184)/(75-90) 144/75 mmHg (05/06 0948) SpO2:  [96 %-98 %] 97 % (05/06 0948),     Intake/Output Summary (Last 24 hours) at 04/29/12 1249 Last data filed at 04/29/12 0700  Gross per 24 hour  Intake   2360 ml  Output   2700 ml  Net   -340 ml    Results for orders placed during the hospital encounter of 04/12/12 (from the past 24 hour(s))  BASIC METABOLIC PANEL     Status: Abnormal   Collection Time   04/29/12  5:15 AM      Component Value Range   Sodium 135  135 - 145 (mEq/L)   Potassium 3.7  3.5 - 5.1 (mEq/L)   Chloride 99  96 - 112 (mEq/L)   CO2 29  19 - 32 (mEq/L)   Glucose, Bld 100 (*) 70 - 99 (mg/dL)   BUN 5 (*) 6 - 23 (mg/dL)   Creatinine, Ser 7.82  0.50 - 1.35 (mg/dL)   Calcium 8.8  8.4 - 95.6 (mg/dL)   GFR calc non Af Amer >90  >90 (mL/min)   GFR calc Af Amer >90  >90 (mL/min)  CBC     Status: Abnormal   Collection Time   04/29/12  5:15 AM      Component Value Range   WBC 9.1  4.0 - 10.5 (K/uL)   RBC 4.04 (*) 4.22 - 5.81 (MIL/uL)   Hemoglobin 8.8 (*) 13.0 - 17.0 (g/dL)   HCT 21.3 (*) 08.6 - 52.0 (%)   MCV 67.6 (*) 78.0 - 100.0 (fL)   MCH 21.8 (*) 26.0 - 34.0 (pg)   MCHC 32.2  30.0 - 36.0 (g/dL)   RDW 57.8  46.9 - 62.9 (%)   Platelets 485 (*) 150 - 400 (K/uL)    SUBJECTIVE:  Less neck pain.  OBJECTIVE:  Mod-lrg purulent drainage on neck dressing.  Min. Swelling or tenderness.  IMPRESSION:  WBC down, afeb.  PLAN:  To advance drains.  No sterile safety pins at bedside as previously ordered.  May be nearing OP therapy.  Will decide further when all drains are out.  Follow WBC.  Flo Shanks

## 2012-04-30 DIAGNOSIS — L02519 Cutaneous abscess of unspecified hand: Secondary | ICD-10-CM

## 2012-04-30 DIAGNOSIS — J9851 Mediastinitis: Secondary | ICD-10-CM

## 2012-04-30 DIAGNOSIS — R7881 Bacteremia: Secondary | ICD-10-CM

## 2012-04-30 DIAGNOSIS — A409 Streptococcal sepsis, unspecified: Secondary | ICD-10-CM

## 2012-04-30 DIAGNOSIS — L03119 Cellulitis of unspecified part of limb: Secondary | ICD-10-CM

## 2012-04-30 LAB — BASIC METABOLIC PANEL
BUN: 5 mg/dL — ABNORMAL LOW (ref 6–23)
Calcium: 8.8 mg/dL (ref 8.4–10.5)
Chloride: 99 mEq/L (ref 96–112)
Creatinine, Ser: 0.64 mg/dL (ref 0.50–1.35)
GFR calc Af Amer: >90 mL/min (ref >90)

## 2012-04-30 LAB — CBC
HCT: 26.3 % — ABNORMAL LOW (ref 39.0–52.0)
MCHC: 31.9 g/dL (ref 30.0–36.0)
Platelets: 432 10*3/uL — ABNORMAL HIGH (ref 150–400)
RDW: 15.6 % — ABNORMAL HIGH (ref 11.5–15.5)
WBC: 9.1 10*3/uL (ref 4.0–10.5)

## 2012-04-30 MED ORDER — POTASSIUM CHLORIDE CRYS ER 20 MEQ PO TBCR
40.0000 meq | EXTENDED_RELEASE_TABLET | Freq: Once | ORAL | Status: AC
  Administered 2012-04-30: 40 meq via ORAL
  Filled 2012-04-30: qty 2

## 2012-04-30 NOTE — Progress Notes (Signed)
INFECTIOUS DISEASE PROGRESS NOTE  ID: [name] is a 51 y.o. male with   Active Problems:  Cellulitis and abscess of hand - LEFT  Bacteremia due to Group A Streptococcus  Neck abscess with extension into right chest wall and mediastinum  Psoas abscess, left  Leukocytosis  Transaminitis- ? SHOCK LIVER  SIRS (systemic inflammatory response syndrome)  Pleural effusion, bilateral with compressive atelectasis  Anemia- ? hemodilution  Hypokalemia  Acute respiratory failure with hypoxia  Right ankle swelling with pain and erythema  Subjective: Believes inadequate analgesia when he has PT is leading to his elevated BPs.   Abtx:  Anti-infectives     Start     Dose/Rate Route Frequency Ordered Stop   04/17/12 1600   clindamycin (CLEOCIN) IVPB 900 mg  Status:  Discontinued        900 mg 100 mL/hr over 30 Minutes Intravenous Every 8 hours 04/17/12 1454 04/21/12 1249   04/17/12 1600  Ampicillin-Sulbactam (UNASYN) 3 g in sodium chloride 0.9 % 100 mL IVPB       3 g 100 mL/hr over 60 Minutes Intravenous Every 6 hours 04/17/12 1455     04/14/12 1400   ceFAZolin (ANCEF) IVPB 1 g/50 mL premix  Status:  Discontinued        1 g 100 mL/hr over 30 Minutes Intravenous 3 times per day 04/14/12 1057 04/17/12 1426   04/13/12 1900   vancomycin (VANCOCIN) 500 mg in sodium chloride 0.9 % 100 mL IVPB  Status:  Discontinued        500 mg 100 mL/hr over 60 Minutes Intravenous Every 12 hours 04/13/12 1601 04/14/12 1058   04/13/12 0815   moxifloxacin (AVELOX) IVPB 400 mg        400 mg 250 mL/hr over 60 Minutes Intravenous  Once 04/13/12 0805 04/13/12 0953   04/13/12 0500   vancomycin (VANCOCIN) IVPB 1000 mg/200 mL premix  Status:  Discontinued        1,000 mg 200 mL/hr over 60 Minutes Intravenous Every 12 hours 04/13/12 0453 04/13/12 1414   04/13/12 0445   vancomycin (VANCOCIN) IVPB 1000 mg/200 mL premix        1,000 mg 200 mL/hr over 60 Minutes Intravenous  Once 04/13/12 0442 04/13/12 0607            Medications:  Scheduled:   . ampicillin-sulbactam (UNASYN) IV  3 g Intravenous Q6H  . colchicine  0.6 mg Oral BID  . enoxaparin (LOVENOX) injection  40 mg Subcutaneous Q24H  . mupirocin ointment   Topical BID  . nystatin  10 mL Oral QID  . sodium chloride  3 mL Intravenous Q12H    Objective: Vital signs in last 24 hours: Temp:  [99.1 F (37.3 C)-100 F (37.8 C)] 99.1 F (37.3 C) (05/07 0543) Pulse Rate:  [82-94] 82  (05/07 0543) Resp:  [17-18] 18  (05/07 0543) BP: (148-176)/(80-90) 154/90 mmHg (05/07 0543) SpO2:  [97 %-100 %] 97 % (05/07 0543)   General appearance: alert, cooperative and no distress Neck: wound with clear, d/c.  Resp: clear to auscultation bilaterally Cardio: regular rate and rhythm GI: normal findings: bowel sounds normal and soft, non-tender Extremities: R great toe has decreased swelling, decreased tenderness.   Lab Results  Basename 04/30/12 0534 04/29/12 0515  WBC 9.1 9.1  HGB 8.4* 8.8*  HCT 26.3* 27.3*  NA 136 135  K 3.4* 3.7  CL 99 99  CO2 27 29  BUN 5* 5*  CREATININE 0.64 0.71  GLU -- --   Liver Panel No results found for this basename: PROT:2,ALBUMIN:2,AST:2,ALT:2,ALKPHOS:2,BILITOT:2,BILIDIR:2,IBILI:2 in the last 72 hours Sedimentation Rate No results found for this basename: ESRSEDRATE in the last 72 hours C-Reactive Protein No results found for this basename: CRP:2 in the last 72 hours  Microbiology: No results found for this or any previous visit (from the past 240 hour(s)).  Studies/Results: No results found.   Assessment/Plan: Group A Strep bacteremia, metastatic foci (neck abscess, R sternocleidomastoid/mediastinum/ R ankle cellulitis/myofascitis, L forearm, L groin)  HTN Day 17 anbx (unasyn)  Had debridement of L wrist, R lower neck/mediastinum/chest wall 04-24-12.  Repeat neck I & D 04-27-12  Would continue his current anbx, plan for 28 days minimum depending on response.  Watch his R great toe closely. It  appears to be improving today with tx for gout.  Agree with switch to ceftriaxone at d/c. Available if q's  Johny Sax Infectious Diseases [phone] 04/30/2012, 1:56 PM   LOS: 18 days

## 2012-04-30 NOTE — Progress Notes (Signed)
One rubber drain noted to be out with safety pin intact, MD made aware and stated to save rubber drain. No new order noted.

## 2012-04-30 NOTE — Progress Notes (Addendum)
OT TREATMENT NOTE  PAIN - 8 TIME:1205 - 1225 20 MIN   04/30/12 1205  OT Visit Information  Last OT Received On 04/30/12  Assistance Needed +1  OT Time Calculation  OT Start Time 1205  OT Stop Time 1225  OT Time Calculation (min) 20 min  Precautions  Precautions Fall  General Exercises - Upper Extremity  Shoulder Flexion AROM;10 reps;Seated;Left  Elbow Flexion AROM;10 reps;Seated  Elbow Extension AROM;Left;10 reps  Digit Composite Flexion AROM;AAROM;Left;10 reps;Supine;Limitations (LIMITED BY EDEMA AND STIFFNESS AND BULKY BANDAGE)  Composite Extension AROM;AAROM;Left;10 reps (CONTRACT/RELAX. PLACE/HOLD.)  Hand Exercises  Digit Lifts Left;AROM;Self ROM;Squeeze ball  OT - End of Session  Activity Tolerance Patient limited by pain  Patient left in bed;with call bell/phone within reach;with family/visitor present  OT Assessment/Plan  Comments on Treatment Session pT SOMEWHAT SELF LIMITS. tAUGHT srom AND EMPHASIZED TO PT AND WIFE IMPORTANCE OF COMLETNIG EXERCISES SEVERAL TIMES DAILY. PT C/O PAIN AND STATED HE FELT AS IF HIS STAPLES WERE PULLING ON BANDAGES.  OT Plan Discharge plan remains appropriate  OT Frequency Min 3X/week  Follow Up Recommendations Home health OT  Equipment Recommended 3 in 1 bedside comode  Acute Rehab OT Goals  OT Goal Formulation With patient  Time For Goal Achievement 05/03/12  Potential to Achieve Goals Good  ADL Goals  Pt Will Perform Eating with modified independence;Sitting, chair;Sitting, edge of bed;Unsupported  ADL Goal: Eating - Progress Progressing toward goals  Pt Will Perform Grooming with modified independence;Unsupported;Standing at sink  ADL Goal: Grooming - Progress Progressing toward goals  Pt Will Perform Upper Body Bathing with modified independence;Unsupported  ADL Goal: Upper Body Bathing - Progress Progressing toward goals  Pt Will Perform Lower Body Bathing with modified independence;Unsupported  ADL Goal: Lower Body Bathing -  Progress Progressing toward goals  Pt Will Perform Upper Body Dressing with modified independence;Supported;Sitting, chair;Sitting, bed  ADL Goal: Upper Body Dressing - Progress Progressing toward goals  Pt Will Perform Lower Body Dressing with modified independence;Sit to stand from chair;Sit to stand from bed;Unsupported  ADL Goal: Lower Body Dressing - Progress Progressing toward goals  Pt Will Transfer to Toilet with modified independence;Ambulation;with DME;3-in-1  ADL Goal: Toilet Transfer - Progress Progressing toward goals  Pt Will Perform Toileting - Clothing Manipulation with modified independence;Standing  ADL Goal: Toileting - Clothing Manipulation - Progress Progressing toward goals  Pt Will Perform Toileting - Hygiene Independently;Sit to stand from 3-in-1/toilet  ADL Goal: Toileting - Hygiene - Progress Progressing toward goals  Arm Goals  Pt Will Tolerate PROM to maintain range of motion;without pain;to decrease contracture;Left upper extremity;5 reps  Arm Goal: PROM - Progress Progressing toward goal  Additional Arm Goal #1 Pt will be Independent with LUE SROM shoulder, elbow, forearm, and digits; as well as keeping LUE elevated appropriately  Arm Goal: Additional Goal #1 - Progress Progressing toward goals    [facility], OTR/L  [phone] 04/30/2012

## 2012-04-30 NOTE — Progress Notes (Signed)
Interim summary The patient is a 52 year old black male who presented with a swollen left hand and arm, as well as generalized pains. It was. It was noted that he had had upper respiratory infection on April 13 for which he sought medical care but did not receive antibiotics and it is presumed that he was treated for a viral upper respiratory infection. He failed to improve, subsequently developed more constitutional symptoms and presented to our hospital with current condition. Septic work up revealed Group A streptococcal bacteremia, with multiple metastatic infective foci. ID consultation was provided by Dr Paulette Blanch Dam initially and ID has continued to follow and provide antibiotic recommendations. He is currently on antibiotic day 17 (currently on Unasyn) but per Dr Daiva Eves was to be transitioned to Rocephin monotherapy on discharge. Duration is unclear at this time. 2D Echocardiogram of 04/18/12, showed normal left ventricular cavity size, mild concentric hypertrophy and ejection fraction of 60% to 65%, with no regional wall motion abnormalities. A small to moderate pericardial effusion was identified circumferential to the heart and no vegetations were identified. Dr Dietrich Pates, [place] cardiologist, reviewed the echocardiogram and  in her opinion the effusion is actually small and no tamponade physiology is seen.  The multiple foci of infection, included neck abscess with extension into right chest wall/Sterno-clavicular joint and mediastinum, cellulitis and abscess of Left hand/Left Psoas abscess/ Right ankle and thigh swelling with pain and erythema- ? Evolving abscess. ENT consultation was provided by Dr Flo Shanks, who performed darinage of neck/mediastinal abscesses, Dr Bradly Bienenstock carried out debridement of LUE and aspiration of right ankle on 04/18/12. Aspiration of left  iliopsoas abscess was performed by Dr Oley Balm,  interventional radiologist, on the same date. Patient had a  transaminitis, likely due to sepsis, but this improved with treatment. She cc was consulted and they have followed patient and ENT had a repeat neck CT on 5/1 revealed loculations in low RIGHT neck, intramuscular RIGHT SCM, sub pectoral accumulations and he was taken back to or for I&D. And subsequently then taken back to OR on 5/4 per ENT and Penrose drains were placed. His dressing changes have been daily per ENT.   Subjective:  Today patient denies any new complaints, states the right great toe pain better.   Objective: Vital signs in last 24 hours: Temp:  [99.1 F (37.3 C)-100 F (37.8 C)] 99.1 F (37.3 C) (05/07 0543) Pulse Rate:  [82-106] 82  (05/07 0543) Resp:  [17-20] 18  (05/07 0543) BP: (148-176)/(80-90) 154/90 mmHg (05/07 0543) SpO2:  [95 %-100 %] 97 % (05/07 0543) Weight change:  Last BM Date: 04/28/12  Intake/Output from previous day: 05/06 0701 - 05/07 0700 In: 1433.3 [I.V.:1433.3] Out: 3100 [Urine:3100] Total I/O In: -  Out: 400 [Urine:400]   Physical Exam: General: A&Ox3, in no resp distress NECK:  with dressing- clean and dry. CHEST:  Clinically clear to auscultation, no wheezes, no crackles. HEART: normal S1,S2, regular, no murmurs. ABDOMEN:  Full, soft, non-tender, no palpable organomegaly, no palpable masses, normal bowel sounds. UPPER EXTREMITIES: Left hand and forearm with dressing clean and dry.  LOWER EXTREMITIES:  No edema, palpable peripheral pulses.,right ankle is non-tender, and has full ROM. Right 1st MTP -decreased swelling, decreased tenderness.. CENTRAL NERVOUS SYSTEM:  No focal neurologic deficit on gross examination.  Lab Results:  [facility] 04/30/12 0534 04/29/12 0515  WBC 9.1 9.1  HGB 8.4* 8.8*  HCT 26.3* 27.3*  PLT 432* 485*    Basename 04/30/12 0534  04/29/12 0515  NA 136 135  K 3.4* 3.7  CL 99 99  CO2 27 29  GLUCOSE 127* 100*  BUN 5* 5*  CREATININE 0.64 0.71  CALCIUM 8.8 8.8   No results found for this or any previous  visit (from the past 240 hour(s)).   Studies/Results: No results found.  Medications: Scheduled Meds:    . ampicillin-sulbactam (UNASYN) IV  3 g Intravenous Q6H  . colchicine  0.6 mg Oral BID  . enoxaparin (LOVENOX) injection  40 mg Subcutaneous Q24H  . mupirocin ointment   Topical BID  . nystatin  10 mL Oral QID  . sodium chloride  3 mL Intravenous Q12H   Continuous Infusions:    . sodium chloride 50 mL/hr at 04/29/12 1740   PRN Meds:.acetaminophen, cloNIDine, morphine injection, ondansetron (ZOFRAN) IV, ondansetron, oxyCODONE, phenol, sodium chloride  Assessment/Plan:  Active Problems:  1. Bacteremia due to Group A Streptococcus/ Sepsis: -Please see interim summary above -Appreciate ID assistance with antibiotic recommendations, continue Unasyn. 2. Multiple septic metastatic foci/abscesses and cellulitis: -Status post I and D's as per interim summary above, -Drainage still purulent as per ENT, drains advanced & daily dressing changes. -Continuing unasyn per ID - afebrile with no leukocytosis, follow - per ENT note still  pus with dressing changes, and drains to be advanced, follow. 3. Acute respiratory failure with hypoxia/ Pleural effusion, bilateral with compressive atelectasis:  Patient had transient hypoxia, soon after hospitalization and procedures. This was felt multifactorial, secondary to pleural effusions and compressive atelectasis,as well a s oioid analgesics. He was deemed at risk for developing HCAP, but recovered with incentive spirometry and oxygen supplementation. 4. Thrush: An incidental finding. Improving with Nystatin swish and swallow.  5. Anemia:   Patient had a mild microcytic anemia, with Hemoglobin of 11.2 and MCV of 67.1 at presentation. During this hospitalization, hemoglobin has dropped, possibly because of added critical illness anemia. Hematinic studies revealed iron deficiency. -hemoglobin stable, may need outpatient GI workup 6. Gout: Patient  has a history of gout, and on 4/30, had right 1st MTP joint swelling , redness and pain.  -Received 5-day course of oral Prednisone -completed on 5/5, pain and swelling was improved while on prednisone but recurred after completing the prednisone -Improved on colchicine started on 5/6, continue to monitor closely as recommended per ID note . 7. Elevated blood pressure without prior history of hypertension -Treat  pain adequately, prednisone was also contributing. -Improved blood pressures off prednisone and on decrease IV fluids, continue monitoring, when necessary clonidine and further treat accordingly. 8.Hypokalemia -Replace potassium.  LOS: 18 days   Sedalia Greeson C 04/30/2012, 1:24 PM

## 2012-04-30 NOTE — Care Management Note (Unsigned)
    Page 1 of 2   04/30/2012     4:05:35 PM   CARE MANAGEMENT NOTE 04/30/2012  Patient:  [name], [name]   Account Number:  [id number]  Date Initiated:  04/16/2012  Documentation initiated by:  Darlyne Russian  Subjective/Objective Assessment:   Patient admitted with cellulitis     Action/Plan:   Progression of care and discharge planning  04/17/12- PT/OT evals-recommending RW with left platform   Anticipated DC Date:  05/03/2012   Anticipated DC Plan:  HOME W HOME HEALTH SERVICES      DC Planning Services  CM consult  [facility] (established/new)      Choice offered to / List presented to:             Status of service:  In process, will continue to follow Medicare Important Message given?   (If response is "NO", the following Medicare IM given date fields will be blank) Date Medicare IM given:   Date Additional Medicare IM given:    Discharge Disposition:    Per UR Regulation:  Reviewed for med. necessity/level of care/duration of stay  If discussed at Long Length of Stay Meetings, dates discussed:    Comments:  04/30/12 Possible need for IV antibx after d/c per ID. Spoke with the patient about possible need to have IV antibx at home. Patient will probably not be able to give IV antibx to self due to bandaged left hand and PICC located in upper rt arm. Patient will talk to wife and determine who would be able to give IV antibx at home. Contacted  Debbie at [facility] and let her know about possible discharge on IV antibx. CM will continue to follow. Jacquelynn Cree RN, BSN, CCM   04/17/12- 1200- Donn Pierini RN, BSN [phone] Spoke with pt at bedside- per conversation pt states he lives at home with wife- independent with ADLs. Pt does not work, wife does- there are no children in the home. Pt reports that he uses [facility] for medications. Pt states that he does not go to any of the clinics but is open to being set up with a clinic. Discussed [facility]- pt stated that he would  not be able to come up with the $45 fee to see the doctor at [facility]. Discussed [facility]- pt agreeable to referral to [facility]- call made to Johney Frame and referral faxed. [facility] have spoken with pt and will continue to follow to offer assistance- pt is not eligible for Medicaid and at this time is not disabled. Spoke with pharmacy and pt is eligible for indigent fund for medication assistance if needed at discharge. CM to continue to follow.

## 2012-04-30 NOTE — Progress Notes (Signed)
Physical Therapy Treatment Patient Details Name: [name] MRN: [id number] DOB: [birth year] Today's Date: 04/30/2012 Time: 4098-1191 PT Time Calculation (min): 24 min  PT Assessment / Plan / Recommendation Comments on Treatment Session  [name] did great today, more participatory with exercises. LLE still weaker.      Follow Up Recommendations  No PT follow up    Equipment Recommendations   (left platform RW)    Frequency     Plan Discharge plan remains appropriate;Frequency remains appropriate    Precautions / Restrictions Restrictions LUE Weight Bearing: Weight bear through elbow only Other Position/Activity Restrictions: weight bear through elbow on left       Mobility  Bed Mobility Supine to Sit: 6: Modified independent (Device/Increase time);HOB elevated (30 degrees) Sit to Supine: 6: Modified independent (Device/Increase time);HOB flat Transfers Sit to Stand: 6: Modified independent (Device/Increase time) Stand to Sit: 6: Modified independent (Device/Increase time) Ambulation/Gait Ambulation/Gait Assistance: 6: Modified independent (Device/Increase time) Ambulation Distance (Feet): 300 Feet Assistive device: Left platform walker Stairs Assistance Details (indicate cue type and reason): mingaurd to ascend forward with right upper extremity on rail, initially pt a bit impulsive needing cues for safety; minA to descend sideways as LLE weaker and less stable for eccentric control, RUE on railing Stair Management Technique: Step to pattern;Forwards;Sideways Number of Stairs: 8  (2 steps x4)    Exercises General Exercises - Lower Extremity Long Arc Quad: AROM;Both;10 reps;Seated Hip Flexion/Marching: AROM;Both;10 reps;Seated Other Exercises Other Exercises: single step up/down with LLE to practice concentric/eccentric control and strengthening x2 with minA for safety; pt easily fatigued with this exercise   PT Goals Acute Rehab PT Goals PT Goal: Sit to Stand -  Progress: Partly met PT Goal: Stand to Sit - Progress: Partly met PT Transfer Goal: Bed to Chair/Chair to Bed - Progress: Progressing toward goal PT Goal: Ambulate - Progress: Met PT Goal: Up/Down Stairs - Progress: Progressing toward goal PT Goal: Perform Home Exercise Program - Progress: Progressing toward goal  Visit Information  Last PT Received On: 04/30/12 Assistance Needed: +1    Subjective Data  Subjective: See, some days I can do good, and some days I can't.    Cognition  Overall Cognitive Status: Appears within functional limits for tasks assessed/performed Arousal/Alertness: Awake/alert Orientation Level: Appears intact for tasks assessed Behavior During Session: [facility] for tasks performed    Balance     End of Session PT - End of Session Equipment Utilized During Treatment: Gait belt Activity Tolerance: Patient tolerated treatment well Patient left: in bed;with call bell/phone within reach;with family/visitor present    [facility] HELEN 04/30/2012, 12:19 PM

## 2012-04-30 NOTE — Progress Notes (Signed)
04/30/2012 8:40 AM  [name], [name] [id number]  Post-Op Day 6/13    Temp:  [99.1 F (37.3 C)-100 F (37.8 C)] 99.1 F (37.3 C) (05/07 0543) Pulse Rate:  [82-106] 82  (05/07 0543) Resp:  [17-20] 18  (05/07 0543) BP: (144-176)/(75-90) 154/90 mmHg (05/07 0543) SpO2:  [95 %-100 %] 97 % (05/07 0543),     Intake/Output Summary (Last 24 hours) at 04/30/12 0840 Last data filed at 04/30/12 0700  Gross per 24 hour  Intake 1433.34 ml  Output   3100 ml  Net -1666.66 ml    Results for orders placed during the hospital encounter of 04/12/12 (from the past 24 hour(s))  BASIC METABOLIC PANEL     Status: Abnormal   Collection Time   04/30/12  5:34 AM      Component Value Range   Sodium 136  135 - 145 (mEq/L)   Potassium 3.4 (*) 3.5 - 5.1 (mEq/L)   Chloride 99  96 - 112 (mEq/L)   CO2 27  19 - 32 (mEq/L)   Glucose, Bld 127 (*) 70 - 99 (mg/dL)   BUN 5 (*) 6 - 23 (mg/dL)   Creatinine, Ser 9.14  0.50 - 1.35 (mg/dL)   Calcium 8.8  8.4 - 78.2 (mg/dL)   GFR calc non Af Amer >90  >90 (mL/min)   GFR calc Af Amer >90  >90 (mL/min)  CBC     Status: Abnormal   Collection Time   04/30/12  5:34 AM      Component Value Range   WBC 9.1  4.0 - 10.5 (K/uL)   RBC 3.91 (*) 4.22 - 5.81 (MIL/uL)   Hemoglobin 8.4 (*) 13.0 - 17.0 (g/dL)   HCT 95.6 (*) 21.3 - 52.0 (%)   MCV 67.3 (*) 78.0 - 100.0 (fL)   MCH 21.5 (*) 26.0 - 34.0 (pg)   MCHC 31.9  30.0 - 36.0 (g/dL)   RDW 08.6 (*) 57.8 - 15.5 (%)   Platelets 432 (*) 150 - 400 (K/uL)    SUBJECTIVE:  Overall feeling better.  Still some neck stiffness.  OBJECTIVE:  Less drainage, but still purulent.   Drains advanced.  IMPRESSION:  Improving.  PLAN:  Advance drains.  Dressing changes daily.  Flo Shanks

## 2012-05-01 DIAGNOSIS — R7881 Bacteremia: Secondary | ICD-10-CM

## 2012-05-01 DIAGNOSIS — L03119 Cellulitis of unspecified part of limb: Secondary | ICD-10-CM

## 2012-05-01 DIAGNOSIS — A409 Streptococcal sepsis, unspecified: Secondary | ICD-10-CM

## 2012-05-01 DIAGNOSIS — L02519 Cutaneous abscess of unspecified hand: Secondary | ICD-10-CM

## 2012-05-01 DIAGNOSIS — J9851 Mediastinitis: Secondary | ICD-10-CM

## 2012-05-01 LAB — BASIC METABOLIC PANEL
BUN: 3 mg/dL — ABNORMAL LOW (ref 6–23)
Creatinine, Ser: 0.64 mg/dL (ref 0.50–1.35)
GFR calc Af Amer: >90 mL/min (ref >90)
GFR calc non Af Amer: >90 mL/min (ref >90)
Potassium: 4 mEq/L (ref 3.5–5.1)

## 2012-05-01 NOTE — Progress Notes (Signed)
Patient ID: [name]  male  HYQ:[id number]    DOB: [birth year]    DOA: 04/12/2012  PCP: Sheila Oats, MD, MD   Interim summary: - Per Dr. Smitty Knudsen note on 04/30/2012  Subjective: Denies any specific complaints, watching TV, pleasant  Objective: Weight change:   Intake/Output Summary (Last 24 hours) at 05/01/12 1315 Last data filed at 05/01/12 1015  Gross per 24 hour  Intake    520 ml  Output   1650 ml  Net  -1130 ml   Blood pressure 154/80, pulse 97, temperature 99.6 F (37.6 C), temperature source Oral, resp. rate 18, height 5\' 4"  (1.626 m), weight 61 kg (134 lb 7.7 oz), SpO2 98.00%.  Physical Exam: General: Alert and awake, oriented x3, not in any acute distress. HEENT: anicteric sclera, pupils reactive to light and accommodation, EOMI, neck dressing clean dry and intact CVS: S1-S2 clear, no murmur rubs or gallops Chest: clear to auscultation bilaterally, no wheezing, rales or rhonchi Abdomen: soft nontender, nondistended, normal bowel sounds, no organomegaly Extremities: no cyanosis, clubbing or edema noted bilaterally, right ankle is non-tender, and has full ROM. Right 1st MTP -decreased swelling, decreased tenderness. Left hand dressing intact.  Neuro: Cranial nerves II-XII intact, no focal neurological deficits  Lab Results: Basic Metabolic Panel:  Lab 05/01/12 9528 04/30/12 0534  NA 135 136  K 4.0 3.4*  CL 98 99  CO2 28 27  GLUCOSE 100* 127*  BUN 3* 5*  CREATININE 0.64 0.64  CALCIUM 9.2 8.8  MG -- --  PHOS -- --   CBC:  Lab 04/30/12 0534 04/29/12 0515  WBC 9.1 9.1  NEUTROABS -- --  HGB 8.4* 8.8*  HCT 26.3* 27.3*  MCV 67.3* 67.6*  PLT 432* 485*   Cardiac Enzymes: No results found for this basename: CKTOTAL:3,CKMB:3,CKMBINDEX:3,TROPONINI:3 in the last 168 hours BNP: No components found with this basename: POCBNP:2 CBG: No results found for this basename: GLUCAP:5 in the last 168 hours   Micro Results: No results found for this or any  previous visit (from the past 240 hour(s)).  Studies/Results: Ct Abdomen Pelvis Wo Contrast  04/17/2012  *RADIOLOGY REPORT*  Clinical Data: Left groin pain  CT ABDOMEN AND PELVIS WITHOUT CONTRAST  Technique:  Multidetector CT imaging of the abdomen and pelvis was performed following the standard protocol without intravenous contrast.  Comparison: None.  Findings: Bilateral pleural effusions are noted with associated compressive atelectasis.  Small pericardial effusion is present.  Unenhanced CT was performed per clinician order.  Lack of IV contrast limits sensitivity and specificity, especially for evaluation of abdominal/pelvic solid viscera.  There is minimal retained contrast within nondilated renal collecting systems from contrast enhanced exam performed separately today.  There is minimal nonspecific peri nephric stranding bilaterally, right greater than left.  Hyperdense area of the peripheral renal cortex of the right upper renal pole image 32 measures 6 mm.  Unenhanced liver, spleen, pancreas, and adrenal glands are normal.  No hydronephrosis.  The bowel is grossly normal.  Bladder is normal.  There is fusiform enlargement and inhomogeneity of the central hypodensity involving the left iliopsoas muscle.  The more focal well-defined area of hypodensity in the inferior portion of this muscle, overlying the region of the left femoral head, measures 2.8 x 2.0 cm.  A small amount of fluid is noted tracking along the interfascial space of the upper left quadriceps muscles.  Mild subcutaneous edema is present.  No lymphadenopathy.  Lumbar fusion hardware again noted.  IMPRESSION: Fusiform enlargement and hypodensity  of the left iliopsoas muscle. Differential considerations include abscess, sterile fluid collection, or hematoma.  Findings discussed with Dr. Lazarus Salines by Dr. Chilton Si 04/17/12 825pm.  Small bilateral pleural effusions and associated compressive atelectasis.  Small pericardial effusion.  Original Report  Authenticated By: Harrel Lemon, M.D.   Dg Chest 2 View  04/08/2012  *RADIOLOGY REPORT*  Clinical Data: Left-sided pain  CHEST - 2 VIEW  Comparison: 11/28/2006  Findings: Heart size is normal.  Mediastinal shadows are normal. Lungs are clear.  No effusions.  No bony abnormalities.  IMPRESSION: Normal chest  Original Report Authenticated By: Thomasenia Sales, M.D.   Ct Soft Tissue Neck W Contrast  04/24/2012  *RADIOLOGY REPORT*  Clinical Data: 51 year old male with history of right neck and parasternal abscess. Status post operative exploration and drainage. Additional acute infections at other site (extremities).  CT NECK WITH CONTRAST  Technique:  Multidetector CT imaging of the neck was performed with intravenous contrast.  Contrast: OMNIPAQUE IOHEXOL 300 MG/ML  SOLN  Comparison: Preoperative study 04/17/2012.  Findings: Postoperative changes with skin incision at the right supraclavicular neck.  Interval increased obscuration of normal fat planes in the around the right sternocleidomastoid and right pectoralis muscles.  Decreased but residual rim enhancing fluid collection, epicenter between the right lobe of the thyroid and right clavicle.  The right sternoclavicular joint remains involved (series 6 image 98).  There remains a subtle rim enhancing collection deep to the pectoralis muscle near the anterior first and second rib ends (image 97).  There remains a small volume of gas associated with these collections.  The rim enhancing component of the collection tracking cephalad along the deep sternocleidomastoid muscle on the right has mildly progressed.  Additional small gas foci more distally near the right shoulder may be incidental intravenous tear (the right extremity IV access and contrast injection for this exam.  Chest findings are reported separately.  The thyroid remains within normal limits except for mass effect on the right lobe.  Major vascular structures including the right internal  jugular vein, right carotid and vertebral arteries remain patent.  Fluid in the retropharyngeal space has decreased.  Parapharyngeal and sublingual spaces remain normal.  Submandibular and parotid glands remain normal.  Pharynx and the larynx contours remain within normal limits.  Visualized orbit soft tissues are within normal limits.  Negative visualized brain parenchyma.  Minimal right sphenoid sinus mucosal thickening.  No acute osseous findings in the face, skull base or cervical spine.  IMPRESSION: 1.  Trans-spatial abscess centered at the right thoracic inlet has modestly decreased following surgery, but significant disease persists - including tracking  along the deep margins of the right pectoralis and right sternocleidomastoid muscles, and associated with the right sternoclavicular joint.  The involvement along the right SCM has mildly progressed. 2.  Case reviewed in person with Dr. Lazarus Salines at [phone] hours on 04/24/2012.  Original Report Authenticated By: Harley Hallmark, M.D.   Ct Soft Tissue Neck W Contrast  04/17/2012  *RADIOLOGY REPORT*  Clinical Data:  Pain and swelling right arm and chest.  Fever and positive blood cultures.  CT NECK AND CHEST WITH CONTRAST  Technique:  Multidetector CT imaging of the neck and chest was performed using the standard protocol after bolus administration of intravenous contrast.  Contrast: OMNIPAQUE IOHEXOL 300 MG/ML  SOLN  Comparison:   None.  CT NECK  Findings:  Large fluid collection in the right neck with peripheral enhancement  and gas bubbles compatible with abscess.  The largest component of the  abscess is  in the right lower neck at the level of thyroid and  measures 6.4 x 3.0 cm.  Multiple loculations of abscess extending to the right sternoclavicular joint and retrosternal mediastinum.  The mediastinal abscess measures approximately 1.1 x 2.5 cm.  Abscess also extends deep to the pectoralis muscle on the right and extends superiorly along the posterior  margin of the sternocleidomastoid muscle on the right. The sternocleidomastoid muscle is enlarged and edematous due to myositis.  No bony destruction is seen involving the sternal clavicular joint to suggest osteomyelitis however  this appears to be  septic arthritis.  Carotid artery and jugular vein are patent bilaterally.  Trachea is deviated to the left due to mass effect.  No compromise of the airway.  Small cervical lymph nodes are present without pathologic adenopathy.  The thyroid is displaced on the right but not involved.  IMPRESSION: Large mass lesion in the right lower neck.  This extends into the sternal clavicular joint which appears to be involved with infection.  There is extension of abscess into  the superior mediastinum posterior to the manubrium.  There is also abscess extending deep to the pectoralis muscle.  CT CHEST  Findings: Small pleural effusions bilaterally, right greater than left.  Mild bibasilar atelectasis.  No definite pneumonia or lung abscess.  Negative for pericardial effusion.  Abscess extends posterior to the manubrium compatible with mediastinitis and abscess related to neck infection as described in the above report.  Imaging of the upper abdomen reveal distended gallbladder with fluid surrounding the gallbladder.  This may be due to cholecystitis.  Consider ultrasound is symptomatic.  IMPRESSION: Bilateral pleural effusions and bibasilar atelectasis.  Mediastinitis with abscess extending from the right neck into the superior mediastinum.  Pericholecystic fluid, suggestive of cholecystitis.  I discussed the findings by telephone with Algis Downs PA on 04/17/2012 at 1400 hours.  Original Report Authenticated By: Camelia Phenes, M.D.   Ct Chest W Contrast  04/24/2012  *RADIOLOGY REPORT*  Clinical Data: Abscess with extension into the chest.  CT CHEST WITH CONTRAST  Technique:  Multidetector CT imaging of the chest was performed following the standard protocol during bolus  administration of intravenous contrast.  Contrast: OMNIPAQUE IOHEXOL 300 MG/ML  SOLN  Comparison: Chest CT 04/17/2012.  Findings:  Mediastinum: Heart size is normal. There is a small amount of pericardial fluid and/or thickening, with some mild pericardial enhancement, slightly increased compared to prior study 04/17/2012. The previously described collection of gas and fluid in the lower right sternocleidomastoid muscle extending to the sternoclavicular joint is again noted, appears slightly smaller than the prior examination.  Around the sternoclavicular joint there is some abnormal soft tissue thickening and enhancement, which encroaches upon the superior mediastinum.  No more inferior mediastinal extension of this process is identified on today's examination. No pathologically enlarged mediastinal or hilar lymph nodes. Esophagus is unremarkable in appearance.  A right upper extremity PICC is in place with tip terminating in the right atrium.  Lungs/Pleura: There are moderate right and small left-sided pleural effusions layering dependently, with associated passive atelectasis in the lower lobes of the lungs bilaterally.  No definite consolidative airspace disease.  No definite suspicious appearing pulmonary nodules or masses are identified.  Upper Abdomen: Visualized portions are unremarkable.  Musculoskeletal: Again noted is some extension of the abnormal enhancing fluid collection into the muscle belly of the right pectoralis major.  There is some small locules of gas within  the right pectoralis major muscle, which may be indicative of gas- forming organisms, or recent surgical intervention. There are no aggressive appearing lytic or blastic lesions noted in the visualized portions of the skeleton.  IMPRESSION: 1.  Slight interval decrease in size of an abnormal enhancing gas containing fluid collection centered in the lower right sternocleidomastoid muscle around the right sternoclavicular joint with  some extension of the right pectoralis muscle.  The at this time, there is some inflammatory changes that result in local mass effect around the right sternoclavicular joint, without frank extension into the mediastinum. 2.  Moderate right and small left-sided pleural effusions layering dependently with associated dependent passive atelectasis in the lower lobes of the lungs bilaterally. 3.  Slight interval increase in the amount of pericardial fluid and/or thickening with pericardial enhancement compared to the prior study. These findings can be seen in the setting of pericarditis.  Clinical correlation is recommended.  Original Report Authenticated By: Florencia Reasons, M.D.   Ct Chest W Contrast  04/17/2012  *RADIOLOGY REPORT*  Clinical Data:  Pain and swelling right arm and chest.  Fever and positive blood cultures.  CT NECK AND CHEST WITH CONTRAST  Technique:  Multidetector CT imaging of the neck and chest was performed using the standard protocol after bolus administration of intravenous contrast.  Contrast: OMNIPAQUE IOHEXOL 300 MG/ML  SOLN  Comparison:   None.  CT NECK  Findings:  Large fluid collection in the right neck with peripheral enhancement  and gas bubbles compatible with abscess.  The largest component of the  abscess is  in the right lower neck at the level of thyroid and  measures 6.4 x 3.0 cm.  Multiple loculations of abscess extending to the right sternoclavicular joint and retrosternal mediastinum.  The mediastinal abscess measures approximately 1.1 x 2.5 cm.  Abscess also extends deep to the pectoralis muscle on the right and extends superiorly along the posterior margin of the sternocleidomastoid muscle on the right. The sternocleidomastoid muscle is enlarged and edematous due to myositis.  No bony destruction is seen involving the sternal clavicular joint to suggest osteomyelitis however  this appears to be  septic arthritis.  Carotid artery and jugular vein are patent  bilaterally.  Trachea is deviated to the left due to mass effect.  No compromise of the airway.  Small cervical lymph nodes are present without pathologic adenopathy.  The thyroid is displaced on the right but not involved.  IMPRESSION: Large mass lesion in the right lower neck.  This extends into the sternal clavicular joint which appears to be involved with infection.  There is extension of abscess into  the superior mediastinum posterior to the manubrium.  There is also abscess extending deep to the pectoralis muscle.  CT CHEST  Findings: Small pleural effusions bilaterally, right greater than left.  Mild bibasilar atelectasis.  No definite pneumonia or lung abscess.  Negative for pericardial effusion.  Abscess extends posterior to the manubrium compatible with mediastinitis and abscess related to neck infection as described in the above report.  Imaging of the upper abdomen reveal distended gallbladder with fluid surrounding the gallbladder.  This may be due to cholecystitis.  Consider ultrasound is symptomatic.  IMPRESSION: Bilateral pleural effusions and bibasilar atelectasis.  Mediastinitis with abscess extending from the right neck into the superior mediastinum.  Pericholecystic fluid, suggestive of cholecystitis.  I discussed the findings by telephone with Algis Downs PA on 04/17/2012 at 1400 hours.  Original Report Authenticated By: Camelia Phenes,  M.D.   [place] Abscess Drain  04/19/2012  *RADIOLOGY REPORT*  Clinical Data: Multiple infectious sites.  Left iliopsoas abscess suggested on CT.  ULTRASOUND GUIDED ASPIRATION  Comparison: CT from the previous day  Technique and findings:  Survey ultrasound of the left iliopsoas and hip region was performed and a   complex intramuscular collection was identified corresponding to the hypodensity seen on CT.  An appropriate skin entry site was determined.  Site was marked, prepped with Betadine, draped in usual sterile fashion, infiltrated locally with 1%  lidocaine. Under real time ultrasound guidance, an 18 gauge spinal needle was advanced into various facets of   the collection.  Only scant amount of bloody fluid could be aspirated.  Sample sent for Gram stain, culture and sensitivity. The patient tolerated the procedure well. No immediate complication.  IMPRESSION:  Aspiration of left iliopsoas process, returning only scant amount of bloody fluid, sent for Gram stain and culture.  Original Report Authenticated By: Osa Craver, M.D.   Ct Hand Left W Contrast  04/13/2012  *RADIOLOGY REPORT*  Clinical Data: Pain, swelling, and erythema of the left forearm and hand.  CT OF THE LEFT HAND WITH CONTRAST  Technique:  Multidetector CT imaging was performed following the standard protocol during bolus administration of intravenous contrast.  Contrast: OMNIPAQUE IOHEXOL 300 MG/ML  SOLN  Comparison: Plain radiographs 04/12/2012  Findings: Diffuse low attenuation change throughout the intramuscular planes and subcutaneous soft tissues of the distal forearm and wrist.  Not loculated appearing fluid collection deep to the distal flexor or muscle and tendon compartment.  Changes are likely represent cellulitis, likely with tenosynovitis and edema. No discrete abscess cavity is demonstrated although focal abscess may be obscured.  MRI is suggested for more complete evaluation. Visualized bones appear intact.  There is no obvious fracture or bone lesion.  No cortical erosion or periosteal reaction to suggest osteomyelitis.  Degenerative changes.  IMPRESSION: Diffuse edema in the soft tissues and intramuscular planes of the distal forearm and wrist.  Volar fluid collection at the distal flexor tendons and muscles likely represents diffuse edema.  No definite localized abscess is demonstrated.  Consider MRI for better evaluation of these changes.  Original Report Authenticated By: Marlon Pel, M.D.   Mr Hand Left W Wo Contrast  04/18/2012  *RADIOLOGY  REPORT*  Clinical Data: Sepsis.  Cellulitis.  MRI OF THE LEFT HAND WITHOUT AND WITH CONTRAST  Technique:  Multiplanar, multisequence MR imaging was performed both before and after administration of intravenous contrast.  Contrast: 15mL MULTIHANCE GADOBENATE DIMEGLUMINE 529 MG/ML IV SOLN  Comparison: CT scan dated 04/13/2012 and radiographs dated 04/12/2012  Findings: There is a 4.9 x 4.0 x 0.8 cm abscess between the pronator quadratus muscle and the flexor digitorum muscles in the distal left forearm.  There is also fluid and abnormal enhancement in the distal radial ulnar joint consistent with a septic joint.  The patient has an effusion in the radiocarpal joint with enhancement of the synovium of that joint as well.  No other discrete abscesses in the forearm or hand.  No osteomyelitis.  The diffuse edema and enhancement of the subcutaneous tissues of the distal forearm and hand is consistent with cellulitis. There is also slight enhancement of the muscles just above the abscess consistent with myositis.  IMPRESSION: Focal abscess in the volar aspect of the distal forearm between the flexor digitorum muscles and the pronator quadratus.  Probable septic distal radial ulnar joint and radiocarpal joint.  Diffuse cellulitis.  Original Report Authenticated By: Gwynn Burly, M.D.   Mr Foot Right W Wo Contrast  04/22/2012  *RADIOLOGY REPORT*  Clinical Data: Right ankle pain and swelling.  History of sepsis.  MRI OF THE RIGHT FOREFOOT WITHOUT AND WITH CONTRAST  Technique:  Multiplanar, multisequence MR imaging was performed both before and after administration of intravenous contrast.  Contrast: 12mL MULTIHANCE GADOBENATE DIMEGLUMINE 529 MG/ML IV SOLN  Comparison: None  Findings: There is diffuse subcutaneous soft tissue swelling/edema most notable along the dorsum of the foot.  There is also edema like signal abnormality involving the foot musculature and fluid in the fascial planes.  The findings suggest  cellulitis and myofasciitis.  No discrete drainable soft tissue abscess.  No findings for septic arthritis or osteomyelitis.  The major ankle and foot tendons and ligaments are intact.  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  IMPRESSION:  1.  Cellulitis and myofasciitis without findings for septic arthritis, osteomyelitis or focal soft tissue abscess. 2.  Plantar fibroma.  Original Report Authenticated By: P. Loralie Champagne, M.D.   Dg Hand Complete Left  04/12/2012  *RADIOLOGY REPORT*  Clinical Data: Left hand pain, swelling, and tenderness.  No known injury.  LEFT HAND - COMPLETE 3+ VIEW  Comparison: None.  Findings: Diffuse soft tissue swelling.  No radiopaque foreign bodies or gas collections in the subcutaneous tissues.  Bones appear intact without evidence of acute fracture or subluxation. Benign-appearing sclerosis in the distal phalanx of the fourth finger.  No destructive bone lesions or expansile lesions demonstrated.  Degenerative changes in the STT, first metacarpal phalangeal, and multiple interphalangeal joints.  No abnormal periosteal reaction.  Bone cortex and trabecular architecture appear intact.  IMPRESSION: Diffuse soft tissue swelling.  No acute bony abnormalities demonstrated.  Degenerative changes.  Benign-appearing bone sclerosis in the distal phalanx of the fourth finger.  Original Report Authenticated By: Marlon Pel, M.D.    Medications: Scheduled Meds:   . ampicillin-sulbactam (UNASYN) IV  3 g Intravenous Q6H  . colchicine  0.6 mg Oral BID  . enoxaparin (LOVENOX) injection  40 mg Subcutaneous Q24H  . mupirocin ointment   Topical BID  . nystatin  10 mL Oral QID  . potassium chloride  40 mEq Oral Once  . sodium chloride  3 mL Intravenous Q12H   Continuous Infusions:   . sodium chloride 50 mL/hr at 04/30/12 1542     Assessment/Plan: Active Problems: 1. Bacteremia due to Group A Streptococcus/ Sepsis:  -   continue Unasyn.  2. Multiple septic metastatic foci/abscesses and cellulitis:  -Status post I and D's, Drainage still purulent as per ENT, drains advanced & daily dressing changes.  - Continuing unasyn per ID recommendations  3. Acute hypoxic respiratory failure and Pleural effusion, bilateral with compressive atelectasis:  Patient had transient hypoxia, soon after hospitalization and procedures. This was felt to be multifactorial, secondary to pleural effusions and compressive atelectasis,as well as oioid analgesics. He was deemed at risk for developing HCAP, but recovered with incentive spirometry and oxygen supplementation.   4. Thrush: Continue Nystatin swish and swallow.   5. Anemia:  Patient had a mild microcytic anemia, anemia panel revealed iron deficiency., May need outpatient GI workup   6. Gout: Patient has a history of gout, and on 4/30, had right 1st MTP joint swelling , redness and pain.  -Received 5-day course of oral Prednisone -completed on 5/5, pain and swelling improved while on prednisone  but recurred after completing the prednisone  -Improved on colchicine(started on 5/6), continue to monitor closely   7. Elevated blood pressure without prior history of hypertension  - Will start on antihypertensives if BP not well-controlled  DVT Prophylaxis: Lovenox  Code Status: Full code  Disposition: Not medically ready   LOS: 19 days   Steffan Caniglia M.D. [facility] 05/01/2012, 1:15 PM Pager: [phone]

## 2012-05-01 NOTE — Progress Notes (Signed)
05/01/2012 10:32 AM  [name], [name] [id number]  Post-Op Day 7/14    Temp:  [99 F (37.2 C)-99.6 F (37.6 C)] 99.6 F (37.6 C) (05/08 0539) Pulse Rate:  [97-103] 97  (05/08 0539) Resp:  [18-19] 18  (05/08 0539) BP: (154-162)/(78-84) 154/80 mmHg (05/08 0539) SpO2:  [94 %-100 %] 98 % (05/08 0539),     Intake/Output Summary (Last 24 hours) at 05/01/12 1032 Last data filed at 05/01/12 1015  Gross per 24 hour  Intake    520 ml  Output   2050 ml  Net  -1530 ml    Results for orders placed during the hospital encounter of 04/12/12 (from the past 24 hour(s))  BASIC METABOLIC PANEL     Status: Abnormal   Collection Time   05/01/12  8:05 AM      Component Value Range   Sodium 135  135 - 145 (mEq/L)   Potassium 4.0  3.5 - 5.1 (mEq/L)   Chloride 98  96 - 112 (mEq/L)   CO2 28  19 - 32 (mEq/L)   Glucose, Bld 100 (*) 70 - 99 (mg/dL)   BUN 3 (*) 6 - 23 (mg/dL)   Creatinine, Ser 4.09  0.50 - 1.35 (mg/dL)   Calcium 9.2  8.4 - 81.1 (mg/dL)   GFR calc non Af Amer >90  >90 (mL/min)   GFR calc Af Amer >90  >90 (mL/min)    SUBJECTIVE:  Feeling overall better.    OBJECTIVE:  Still mod purulent drainage.  Drains advanced.  One fell out yest.  IMPRESSION:  Satisfactory check  PLAN:  Wound care.  Advance drains.  Check wbc 1-2 days.  Flo Shanks

## 2012-05-02 DIAGNOSIS — L02519 Cutaneous abscess of unspecified hand: Secondary | ICD-10-CM

## 2012-05-02 DIAGNOSIS — R7881 Bacteremia: Secondary | ICD-10-CM

## 2012-05-02 DIAGNOSIS — J9851 Mediastinitis: Secondary | ICD-10-CM

## 2012-05-02 DIAGNOSIS — L03119 Cellulitis of unspecified part of limb: Secondary | ICD-10-CM

## 2012-05-02 DIAGNOSIS — A409 Streptococcal sepsis, unspecified: Secondary | ICD-10-CM

## 2012-05-02 MED ORDER — METOPROLOL TARTRATE 25 MG PO TABS
25.0000 mg | ORAL_TABLET | Freq: Two times a day (BID) | ORAL | Status: DC
  Administered 2012-05-02 – 2012-05-07 (×11): 25 mg via ORAL
  Filled 2012-05-02 (×13): qty 1

## 2012-05-02 NOTE — Progress Notes (Signed)
Patient ID: [name]  male  BMW:[id number]    DOB: [birth year]    DOA: 04/12/2012  PCP: Sheila Oats, MD, MD   Interim summary: - Per Dr. Smitty Knudsen note on 04/30/2012  Subjective: Denies any specific complaints, watching TV, family member at the bedside. Left hand/wrist dressing hasn't been changed for a week per patient  Objective: Weight change:   Intake/Output Summary (Last 24 hours) at 05/02/12 1219 Last data filed at 05/02/12 0635  Gross per 24 hour  Intake    450 ml  Output   2700 ml  Net  -2250 ml   Blood pressure 142/80, pulse 90, temperature 99.5 F (37.5 C), temperature source Oral, resp. rate 20, height 5\' 4"  (1.626 m), weight 61 kg (134 lb 7.7 oz), SpO2 99.00%.  Physical Exam: General: Alert and awake, oriented x3, not in any acute distress. HEENT: anicteric sclera, pupils reactive to light and accommodation, EOMI, neck dressing clean dry and intact CVS: S1-S2 clear, no murmur rubs or gallops Chest: clear to auscultation bilaterally, no wheezing, rales or rhonchi Abdomen: soft nontender, nondistended, normal bowel sounds, no organomegaly Extremities: no cyanosis, clubbing or edema noted bilaterally. Left hand dressing intact.   Lab Results: Basic Metabolic Panel:  Lab 05/01/12 2725 04/30/12 0534  NA 135 136  K 4.0 3.4*  CL 98 99  CO2 28 27  GLUCOSE 100* 127*  BUN 3* 5*  CREATININE 0.64 0.64  CALCIUM 9.2 8.8  MG -- --  PHOS -- --   CBC:  Lab 04/30/12 0534 04/29/12 0515  WBC 9.1 9.1  NEUTROABS -- --  HGB 8.4* 8.8*  HCT 26.3* 27.3*  MCV 67.3* 67.6*  PLT 432* 485*    Studies/Results: Ct Abdomen Pelvis Wo Contrast  04/17/2012  *RADIOLOGY REPORT*  Clinical Data: Left groin pain  CT ABDOMEN AND PELVIS WITHOUT CONTRAST  Technique:  Multidetector CT imaging of the abdomen and pelvis was performed following the standard protocol without intravenous contrast.  Comparison: None.  Findings: Bilateral pleural effusions are noted with associated  compressive atelectasis.  Small pericardial effusion is present.  Unenhanced CT was performed per clinician order.  Lack of IV contrast limits sensitivity and specificity, especially for evaluation of abdominal/pelvic solid viscera.  There is minimal retained contrast within nondilated renal collecting systems from contrast enhanced exam performed separately today.  There is minimal nonspecific peri nephric stranding bilaterally, right greater than left.  Hyperdense area of the peripheral renal cortex of the right upper renal pole image 32 measures 6 mm.  Unenhanced liver, spleen, pancreas, and adrenal glands are normal.  No hydronephrosis.  The bowel is grossly normal.  Bladder is normal.  There is fusiform enlargement and inhomogeneity of the central hypodensity involving the left iliopsoas muscle.  The more focal well-defined area of hypodensity in the inferior portion of this muscle, overlying the region of the left femoral head, measures 2.8 x 2.0 cm.  A small amount of fluid is noted tracking along the interfascial space of the upper left quadriceps muscles.  Mild subcutaneous edema is present.  No lymphadenopathy.  Lumbar fusion hardware again noted.  IMPRESSION: Fusiform enlargement and hypodensity of the left iliopsoas muscle. Differential considerations include abscess, sterile fluid collection, or hematoma.  Findings discussed with Dr. Lazarus Salines by Dr. Chilton Si 04/17/12 825pm.  Small bilateral pleural effusions and associated compressive atelectasis.  Small pericardial effusion.  Original Report Authenticated By: Harrel Lemon, M.D.   Dg Chest 2 View  04/08/2012  *RADIOLOGY REPORT*  Clinical Data: Left-sided  pain  CHEST - 2 VIEW  Comparison: 11/28/2006  Findings: Heart size is normal.  Mediastinal shadows are normal. Lungs are clear.  No effusions.  No bony abnormalities.  IMPRESSION: Normal chest  Original Report Authenticated By: Thomasenia Sales, M.D.   Ct Soft Tissue Neck W Contrast  04/24/2012   *RADIOLOGY REPORT*  Clinical Data: 51 year old male with history of right neck and parasternal abscess. Status post operative exploration and drainage. Additional acute infections at other site (extremities).  CT NECK WITH CONTRAST  Technique:  Multidetector CT imaging of the neck was performed with intravenous contrast.  Contrast: OMNIPAQUE IOHEXOL 300 MG/ML  SOLN  Comparison: Preoperative study 04/17/2012.  Findings: Postoperative changes with skin incision at the right supraclavicular neck.  Interval increased obscuration of normal fat planes in the around the right sternocleidomastoid and right pectoralis muscles.  Decreased but residual rim enhancing fluid collection, epicenter between the right lobe of the thyroid and right clavicle.  The right sternoclavicular joint remains involved (series 6 image 98).  There remains a subtle rim enhancing collection deep to the pectoralis muscle near the anterior first and second rib ends (image 97).  There remains a small volume of gas associated with these collections.  The rim enhancing component of the collection tracking cephalad along the deep sternocleidomastoid muscle on the right has mildly progressed.  Additional small gas foci more distally near the right shoulder may be incidental intravenous tear (the right extremity IV access and contrast injection for this exam.  Chest findings are reported separately.  The thyroid remains within normal limits except for mass effect on the right lobe.  Major vascular structures including the right internal jugular vein, right carotid and vertebral arteries remain patent.  Fluid in the retropharyngeal space has decreased.  Parapharyngeal and sublingual spaces remain normal.  Submandibular and parotid glands remain normal.  Pharynx and the larynx contours remain within normal limits.  Visualized orbit soft tissues are within normal limits.  Negative visualized brain parenchyma.  Minimal right sphenoid sinus mucosal  thickening.  No acute osseous findings in the face, skull base or cervical spine.  IMPRESSION: 1.  Trans-spatial abscess centered at the right thoracic inlet has modestly decreased following surgery, but significant disease persists - including tracking  along the deep margins of the right pectoralis and right sternocleidomastoid muscles, and associated with the right sternoclavicular joint.  The involvement along the right SCM has mildly progressed. 2.  Case reviewed in person with Dr. Lazarus Salines at [phone] hours on 04/24/2012.  Original Report Authenticated By: Harley Hallmark, M.D.   Ct Soft Tissue Neck W Contrast  04/17/2012  *RADIOLOGY REPORT*  Clinical Data:  Pain and swelling right arm and chest.  Fever and positive blood cultures.  CT NECK AND CHEST WITH CONTRAST  Technique:  Multidetector CT imaging of the neck and chest was performed using the standard protocol after bolus administration of intravenous contrast.  Contrast: OMNIPAQUE IOHEXOL 300 MG/ML  SOLN  Comparison:   None.  CT NECK  Findings:  Large fluid collection in the right neck with peripheral enhancement  and gas bubbles compatible with abscess.  The largest component of the  abscess is  in the right lower neck at the level of thyroid and  measures 6.4 x 3.0 cm.  Multiple loculations of abscess extending to the right sternoclavicular joint and retrosternal mediastinum.  The mediastinal abscess measures approximately 1.1 x 2.5 cm.  Abscess also extends deep to the pectoralis muscle on the  right and extends superiorly along the posterior margin of the sternocleidomastoid muscle on the right. The sternocleidomastoid muscle is enlarged and edematous due to myositis.  No bony destruction is seen involving the sternal clavicular joint to suggest osteomyelitis however  this appears to be  septic arthritis.  Carotid artery and jugular vein are patent bilaterally.  Trachea is deviated to the left due to mass effect.  No compromise of the airway.  Small  cervical lymph nodes are present without pathologic adenopathy.  The thyroid is displaced on the right but not involved.  IMPRESSION: Large mass lesion in the right lower neck.  This extends into the sternal clavicular joint which appears to be involved with infection.  There is extension of abscess into  the superior mediastinum posterior to the manubrium.  There is also abscess extending deep to the pectoralis muscle.  CT CHEST  Findings: Small pleural effusions bilaterally, right greater than left.  Mild bibasilar atelectasis.  No definite pneumonia or lung abscess.  Negative for pericardial effusion.  Abscess extends posterior to the manubrium compatible with mediastinitis and abscess related to neck infection as described in the above report.  Imaging of the upper abdomen reveal distended gallbladder with fluid surrounding the gallbladder.  This may be due to cholecystitis.  Consider ultrasound is symptomatic.  IMPRESSION: Bilateral pleural effusions and bibasilar atelectasis.  Mediastinitis with abscess extending from the right neck into the superior mediastinum.  Pericholecystic fluid, suggestive of cholecystitis.  I discussed the findings by telephone with Algis Downs PA on 04/17/2012 at 1400 hours.  Original Report Authenticated By: Camelia Phenes, M.D.   Ct Chest W Contrast  04/24/2012  *RADIOLOGY REPORT*  Clinical Data: Abscess with extension into the chest.  CT CHEST WITH CONTRAST  Technique:  Multidetector CT imaging of the chest was performed following the standard protocol during bolus administration of intravenous contrast.  Contrast: OMNIPAQUE IOHEXOL 300 MG/ML  SOLN  Comparison: Chest CT 04/17/2012.  Findings:  Mediastinum: Heart size is normal. There is a small amount of pericardial fluid and/or thickening, with some mild pericardial enhancement, slightly increased compared to prior study 04/17/2012. The previously described collection of gas and fluid in the lower right  sternocleidomastoid muscle extending to the sternoclavicular joint is again noted, appears slightly smaller than the prior examination.  Around the sternoclavicular joint there is some abnormal soft tissue thickening and enhancement, which encroaches upon the superior mediastinum.  No more inferior mediastinal extension of this process is identified on today's examination. No pathologically enlarged mediastinal or hilar lymph nodes. Esophagus is unremarkable in appearance.  A right upper extremity PICC is in place with tip terminating in the right atrium.  Lungs/Pleura: There are moderate right and small left-sided pleural effusions layering dependently, with associated passive atelectasis in the lower lobes of the lungs bilaterally.  No definite consolidative airspace disease.  No definite suspicious appearing pulmonary nodules or masses are identified.  Upper Abdomen: Visualized portions are unremarkable.  Musculoskeletal: Again noted is some extension of the abnormal enhancing fluid collection into the muscle belly of the right pectoralis major.  There is some small locules of gas within the right pectoralis major muscle, which may be indicative of gas- forming organisms, or recent surgical intervention. There are no aggressive appearing lytic or blastic lesions noted in the visualized portions of the skeleton.  IMPRESSION: 1.  Slight interval decrease in size of an abnormal enhancing gas containing fluid collection centered in the lower right sternocleidomastoid muscle around the right  sternoclavicular joint with some extension of the right pectoralis muscle.  The at this time, there is some inflammatory changes that result in local mass effect around the right sternoclavicular joint, without frank extension into the mediastinum. 2.  Moderate right and small left-sided pleural effusions layering dependently with associated dependent passive atelectasis in the lower lobes of the lungs bilaterally. 3.  Slight  interval increase in the amount of pericardial fluid and/or thickening with pericardial enhancement compared to the prior study. These findings can be seen in the setting of pericarditis.  Clinical correlation is recommended.  Original Report Authenticated By: Florencia Reasons, M.D.   Ct Chest W Contrast  04/17/2012  *RADIOLOGY REPORT*  Clinical Data:  Pain and swelling right arm and chest.  Fever and positive blood cultures.  CT NECK AND CHEST WITH CONTRAST  Technique:  Multidetector CT imaging of the neck and chest was performed using the standard protocol after bolus administration of intravenous contrast.  Contrast: OMNIPAQUE IOHEXOL 300 MG/ML  SOLN  Comparison:   None.  CT NECK  Findings:  Large fluid collection in the right neck with peripheral enhancement  and gas bubbles compatible with abscess.  The largest component of the  abscess is  in the right lower neck at the level of thyroid and  measures 6.4 x 3.0 cm.  Multiple loculations of abscess extending to the right sternoclavicular joint and retrosternal mediastinum.  The mediastinal abscess measures approximately 1.1 x 2.5 cm.  Abscess also extends deep to the pectoralis muscle on the right and extends superiorly along the posterior margin of the sternocleidomastoid muscle on the right. The sternocleidomastoid muscle is enlarged and edematous due to myositis.  No bony destruction is seen involving the sternal clavicular joint to suggest osteomyelitis however  this appears to be  septic arthritis.  Carotid artery and jugular vein are patent bilaterally.  Trachea is deviated to the left due to mass effect.  No compromise of the airway.  Small cervical lymph nodes are present without pathologic adenopathy.  The thyroid is displaced on the right but not involved.  IMPRESSION: Large mass lesion in the right lower neck.  This extends into the sternal clavicular joint which appears to be involved with infection.  There is extension of abscess into   the superior mediastinum posterior to the manubrium.  There is also abscess extending deep to the pectoralis muscle.  CT CHEST  Findings: Small pleural effusions bilaterally, right greater than left.  Mild bibasilar atelectasis.  No definite pneumonia or lung abscess.  Negative for pericardial effusion.  Abscess extends posterior to the manubrium compatible with mediastinitis and abscess related to neck infection as described in the above report.  Imaging of the upper abdomen reveal distended gallbladder with fluid surrounding the gallbladder.  This may be due to cholecystitis.  Consider ultrasound is symptomatic.  IMPRESSION: Bilateral pleural effusions and bibasilar atelectasis.  Mediastinitis with abscess extending from the right neck into the superior mediastinum.  Pericholecystic fluid, suggestive of cholecystitis.  I discussed the findings by telephone with Algis Downs PA on 04/17/2012 at 1400 hours.  Original Report Authenticated By: Camelia Phenes, M.D.   [place] Abscess Drain  04/19/2012  *RADIOLOGY REPORT*  Clinical Data: Multiple infectious sites.  Left iliopsoas abscess suggested on CT.  ULTRASOUND GUIDED ASPIRATION  Comparison: CT from the previous day  Technique and findings:  Survey ultrasound of the left iliopsoas and hip region was performed and a   complex intramuscular collection was identified corresponding  to the hypodensity seen on CT.  An appropriate skin entry site was determined.  Site was marked, prepped with Betadine, draped in usual sterile fashion, infiltrated locally with 1% lidocaine. Under real time ultrasound guidance, an 18 gauge spinal needle was advanced into various facets of   the collection.  Only scant amount of bloody fluid could be aspirated.  Sample sent for Gram stain, culture and sensitivity. The patient tolerated the procedure well. No immediate complication.  IMPRESSION:  Aspiration of left iliopsoas process, returning only scant amount of bloody fluid, sent for Gram  stain and culture.  Original Report Authenticated By: Osa Craver, M.D.   Ct Hand Left W Contrast  04/13/2012  *RADIOLOGY REPORT*  Clinical Data: Pain, swelling, and erythema of the left forearm and hand.  CT OF THE LEFT HAND WITH CONTRAST  Technique:  Multidetector CT imaging was performed following the standard protocol during bolus administration of intravenous contrast.  Contrast: OMNIPAQUE IOHEXOL 300 MG/ML  SOLN  Comparison: Plain radiographs 04/12/2012  Findings: Diffuse low attenuation change throughout the intramuscular planes and subcutaneous soft tissues of the distal forearm and wrist.  Not loculated appearing fluid collection deep to the distal flexor or muscle and tendon compartment.  Changes are likely represent cellulitis, likely with tenosynovitis and edema. No discrete abscess cavity is demonstrated although focal abscess may be obscured.  MRI is suggested for more complete evaluation. Visualized bones appear intact.  There is no obvious fracture or bone lesion.  No cortical erosion or periosteal reaction to suggest osteomyelitis.  Degenerative changes.  IMPRESSION: Diffuse edema in the soft tissues and intramuscular planes of the distal forearm and wrist.  Volar fluid collection at the distal flexor tendons and muscles likely represents diffuse edema.  No definite localized abscess is demonstrated.  Consider MRI for better evaluation of these changes.  Original Report Authenticated By: Marlon Pel, M.D.   Mr Hand Left W Wo Contrast  04/18/2012  *RADIOLOGY REPORT*  Clinical Data: Sepsis.  Cellulitis.  MRI OF THE LEFT HAND WITHOUT AND WITH CONTRAST  Technique:  Multiplanar, multisequence MR imaging was performed both before and after administration of intravenous contrast.  Contrast: 15mL MULTIHANCE GADOBENATE DIMEGLUMINE 529 MG/ML IV SOLN  Comparison: CT scan dated 04/13/2012 and radiographs dated 04/12/2012  Findings: There is a 4.9 x 4.0 x 0.8 cm abscess between the  pronator quadratus muscle and the flexor digitorum muscles in the distal left forearm.  There is also fluid and abnormal enhancement in the distal radial ulnar joint consistent with a septic joint.  The patient has an effusion in the radiocarpal joint with enhancement of the synovium of that joint as well.  No other discrete abscesses in the forearm or hand.  No osteomyelitis.  The diffuse edema and enhancement of the subcutaneous tissues of the distal forearm and hand is consistent with cellulitis. There is also slight enhancement of the muscles just above the abscess consistent with myositis.  IMPRESSION: Focal abscess in the volar aspect of the distal forearm between the flexor digitorum muscles and the pronator quadratus.  Probable septic distal radial ulnar joint and radiocarpal joint.  Diffuse cellulitis.  Original Report Authenticated By: Gwynn Burly, M.D.   Mr Foot Right W Wo Contrast  04/22/2012  *RADIOLOGY REPORT*  Clinical Data: Right ankle pain and swelling.  History of sepsis.  MRI OF THE RIGHT FOREFOOT WITHOUT AND WITH CONTRAST  Technique:  Multiplanar, multisequence MR imaging was performed both before and after administration of  intravenous contrast.  Contrast: 12mL MULTIHANCE GADOBENATE DIMEGLUMINE 529 MG/ML IV SOLN  Comparison: None  Findings: There is diffuse subcutaneous soft tissue swelling/edema most notable along the dorsum of the foot.  There is also edema like signal abnormality involving the foot musculature and fluid in the fascial planes.  The findings suggest cellulitis and myofasciitis.  No discrete drainable soft tissue abscess.  No findings for septic arthritis or osteomyelitis.  The major ankle and foot tendons and ligaments are intact.  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  IMPRESSION:  1.  Cellulitis and myofasciitis without findings for septic arthritis, osteomyelitis or focal soft tissue abscess. 2.  Plantar fibroma.   Original Report Authenticated By: P. Loralie Champagne, M.D.   Dg Hand Complete Left  04/12/2012  *RADIOLOGY REPORT*  Clinical Data: Left hand pain, swelling, and tenderness.  No known injury.  LEFT HAND - COMPLETE 3+ VIEW  Comparison: None.  Findings: Diffuse soft tissue swelling.  No radiopaque foreign bodies or gas collections in the subcutaneous tissues.  Bones appear intact without evidence of acute fracture or subluxation. Benign-appearing sclerosis in the distal phalanx of the fourth finger.  No destructive bone lesions or expansile lesions demonstrated.  Degenerative changes in the STT, first metacarpal phalangeal, and multiple interphalangeal joints.  No abnormal periosteal reaction.  Bone cortex and trabecular architecture appear intact.  IMPRESSION: Diffuse soft tissue swelling.  No acute bony abnormalities demonstrated.  Degenerative changes.  Benign-appearing bone sclerosis in the distal phalanx of the fourth finger.  Original Report Authenticated By: Marlon Pel, M.D.    Medications: Scheduled Meds:    . ampicillin-sulbactam (UNASYN) IV  3 g Intravenous Q6H  . colchicine  0.6 mg Oral BID  . enoxaparin (LOVENOX) injection  40 mg Subcutaneous Q24H  . metoprolol tartrate  25 mg Oral BID  . mupirocin ointment   Topical BID  . nystatin  10 mL Oral QID  . sodium chloride  3 mL Intravenous Q12H   Continuous Infusions:    . sodium chloride 50 mL/hr at 05/02/12 0947     Assessment/Plan: Active Problems: 1. Bacteremia due to Group A Streptococcus/ Sepsis, left wrist septic arthritis : Status post I&D, left wrist arthrotomy and drainage on 04/24/2012 and 04/25/2012 -  continue Unasyn. Will call Dr. Orlan Leavens again today for followup on the wrist, discussed with the RN.  2. Multiple septic foci/abscesses and cellulitis:  - Status post I and D's, ENT closely following, drains advanced & daily dressing changes.  - Continuing unasyn per ID recommendations  3. Acute hypoxic  respiratory failure and Pleural effusion, bilateral with compressive atelectasis: Transient during hospitalization felt to be multifactorial, secondary to opioids, pleural effusions and compressive atelectasis.   4. Thrush: Continue Nystatin swish and swallow.   5. Anemia:  Patient had a mild microcytic anemia, anemia panel revealed iron deficiency., May need outpatient GI workup   6. Gout: Patient has a history of gout, and on 4/30, had right 1st MTP joint swelling, redness and pain.  -Received 5-day course of oral Prednisone -completed on 5/5, pain and swelling improved while on prednisone but recurred after completing the prednisone  -Improved on colchicine(started on 5/6), continue to monitor closely   7. Hypertension: With multiple Elevated BP readings   - Started on Lopressor twice a day   DVT Prophylaxis: Lovenox  Code Status: Full code  Disposition: Currently not medically ready but patient may need short-term skilled nursing facility for dressing  changes and IV antibiotics.   LOS: 20 days   Ebonique Hallstrom M.D. [facility] 05/02/2012, 12:19 PM Pager: [phone]

## 2012-05-02 NOTE — Progress Notes (Signed)
Physical Therapy Treatment Patient Details Name: [name] MRN: [id number] DOB: 17-Sep-1961 Today's Date: 05/02/2012 Time: 8469-6295 PT Time Calculation (min): 28 min  PT Assessment / Plan / Recommendation Comments on Treatment Session  Modified independent with all mobility at this point. No further PT needs at this time. Pt agreeable. Encouraged pt to ambulate daily, multiple times to prevent weakness while staying here in the hospital.     Follow Up Recommendations    No PT follow up    Barriers to Discharge        Equipment Recommendations   (left platform walker)    Recommendations for Other Services  NA  Frequency     Plan All goals met and education completed, patient discharged from PT services;Discharge plan remains appropriate    Precautions / Restrictions Restrictions LUE Weight Bearing: Weight bear through elbow only       Mobility  Bed Mobility Supine to Sit: 6: Modified independent (Device/Increase time) Sitting - Scoot to Edge of Bed: 6: Modified independent (Device/Increase time) Sit to Supine: 6: Modified independent (Device/Increase time) Scooting to [facility]: 6: Modified independent (Device/Increase time) Transfers Sit to Stand: 6: Modified independent (Device/Increase time) Stand to Sit: 6: Modified independent (Device/Increase time) Ambulation/Gait Ambulation/Gait Assistance: 6: Modified independent (Device/Increase time) Ambulation Distance (Feet): 500 Feet Assistive device: Left platform walker Ambulation/Gait Assistance Details: slow but safe gait, drags his feet but pt reports he will stop this when his gout goes away Gait Pattern: Within Functional Limits Stairs Assistance: 6: Modified independent (Device/Increase time) Stairs Assistance Details (indicate cue type and reason): slow and safe technique today (used ortho gym stairs to decrease his anxiety of falling); sideways to descend, forward to ascend Stair Management Technique: One rail  Right;Step to pattern;Forwards Number of Stairs: 12  (2 steps x6)    Exercises      PT Goals Acute Rehab PT Goals PT Goal: Sit to Stand - Progress: Met PT Goal: Stand to Sit - Progress: Met PT Transfer Goal: Bed to Chair/Chair to Bed - Progress: Met PT Goal: Ambulate - Progress: Met PT Goal: Up/Down Stairs - Progress: Met PT Goal: Perform Home Exercise Program - Progress: Partly met (pt verbalizes understanding)  Visit Information  Last PT Received On: 05/02/12 Assistance Needed: +1    Subjective Data  Subjective: Ya'll think I can't do things, thats just me playing you.    Cognition  Overall Cognitive Status: Appears within functional limits for tasks assessed/performed Arousal/Alertness: Awake/alert Orientation Level: Appears intact for tasks assessed Behavior During Session: [facility] for tasks performed    Balance     End of Session PT - End of Session Equipment Utilized During Treatment: Gait belt Activity Tolerance: Patient tolerated treatment well Patient left: in bed;with call bell/phone within reach Nurse Communication: Mobility status;Other (comment) (d/c PT )    [name],[name] 05/02/2012, 3:01 PM

## 2012-05-02 NOTE — Progress Notes (Signed)
05/02/2012 5:33 PM  [name], [name] [id number]  Post-Op Day 8/15    Temp:  [99.3 F (37.4 C)-99.8 F (37.7 C)] 99.8 F (37.7 C) (05/09 1324) Pulse Rate:  [88-102] 88  (05/09 1324) Resp:  [18-20] 18  (05/09 1324) BP: (140-170)/(67-86) 140/67 mmHg (05/09 1324) SpO2:  [99 %-100 %] 99 % (05/09 1324),     Intake/Output Summary (Last 24 hours) at 05/02/12 1733 Last data filed at 05/02/12 1500  Gross per 24 hour  Intake    480 ml  Output   3250 ml  Net  -2770 ml    No results found for this or any previous visit (from the past 24 hour(s)).  SUBJECTIVE:  Less neck pain.  OBJECTIVE:  Still mod drainage.  Drains all d/c'd.  IMPRESSION:  Satisfactory check  PLAN:  Dressing changes.  Flo Shanks

## 2012-05-02 NOTE — Progress Notes (Signed)
PT SEEN/EXAMINED FINGERS WARM WELL PERFUSED GOOD CAP REFIL WILL CHECK WOUNDS THIS WEEKEND CONTINUE WITH CURRENT DRESSING/SPLINT WILL CONTINUE TO FOLLOW

## 2012-05-02 NOTE — Progress Notes (Signed)
Contacted Dr. Terance Ice office and spoke to his secretary, notified if doctor can f/u on pt. as per Dr. Isidoro Donning

## 2012-05-03 DIAGNOSIS — A409 Streptococcal sepsis, unspecified: Secondary | ICD-10-CM

## 2012-05-03 DIAGNOSIS — L03119 Cellulitis of unspecified part of limb: Secondary | ICD-10-CM

## 2012-05-03 DIAGNOSIS — L02519 Cutaneous abscess of unspecified hand: Secondary | ICD-10-CM

## 2012-05-03 DIAGNOSIS — J9851 Mediastinitis: Secondary | ICD-10-CM

## 2012-05-03 DIAGNOSIS — R7881 Bacteremia: Secondary | ICD-10-CM

## 2012-05-03 MED ORDER — NYSTATIN 100000 UNIT/GM EX POWD
Freq: Two times a day (BID) | CUTANEOUS | Status: DC
  Administered 2012-05-03 – 2012-05-07 (×9): via TOPICAL
  Filled 2012-05-03: qty 15

## 2012-05-03 NOTE — Progress Notes (Signed)
Patient ID: [name]  male  QMV:[id number]    DOB: [birth year]    DOA: 04/12/2012  PCP: Sheila Oats, MD, MD   Interim summary: - Per Dr. Smitty Knudsen note on 04/30/2012  Subjective: Denies any specific complaints,   Objective: Weight change:   Intake/Output Summary (Last 24 hours) at 05/03/12 1301 Last data filed at 05/03/12 0945  Gross per 24 hour  Intake   1340 ml  Output   2850 ml  Net  -1510 ml   Blood pressure 145/75, pulse 92, temperature 99.2 F (37.3 C), temperature source Oral, resp. rate 18, height 5\' 4"  (1.626 m), weight 61 kg (134 lb 7.7 oz), SpO2 98.00%.  Physical Exam: General: Alert and awake, oriented x3, not in any acute distress. HEENT: anicteric sclera, pupils reactive to light and accommodation, EOMI, neck dressing clean dry and intact CVS: S1-S2 clear, no murmur rubs or gallops Chest: clear to auscultation bilaterally, no wheezing, rales or rhonchi Abdomen: soft nontender, nondistended, normal bowel sounds, no organomegaly Extremities: no cyanosis, clubbing or edema noted bilaterally. Left hand dressing intact.   Lab Results: Basic Metabolic Panel:  Lab 05/01/12 2841 04/30/12 0534  NA 135 136  K 4.0 3.4*  CL 98 99  CO2 28 27  GLUCOSE 100* 127*  BUN 3* 5*  CREATININE 0.64 0.64  CALCIUM 9.2 8.8  MG -- --  PHOS -- --   CBC:  Lab 04/30/12 0534 04/29/12 0515  WBC 9.1 9.1  NEUTROABS -- --  HGB 8.4* 8.8*  HCT 26.3* 27.3*  MCV 67.3* 67.6*  PLT 432* 485*    Studies/Results: Ct Abdomen Pelvis Wo Contrast  04/17/2012  *RADIOLOGY REPORT*  Clinical Data: Left groin pain  CT ABDOMEN AND PELVIS WITHOUT CONTRAST  Technique:  Multidetector CT imaging of the abdomen and pelvis was performed following the standard protocol without intravenous contrast.  Comparison: None.  Findings: Bilateral pleural effusions are noted with associated compressive atelectasis.  Small pericardial effusion is present.  Unenhanced CT was performed per clinician order.   Lack of IV contrast limits sensitivity and specificity, especially for evaluation of abdominal/pelvic solid viscera.  There is minimal retained contrast within nondilated renal collecting systems from contrast enhanced exam performed separately today.  There is minimal nonspecific peri nephric stranding bilaterally, right greater than left.  Hyperdense area of the peripheral renal cortex of the right upper renal pole image 32 measures 6 mm.  Unenhanced liver, spleen, pancreas, and adrenal glands are normal.  No hydronephrosis.  The bowel is grossly normal.  Bladder is normal.  There is fusiform enlargement and inhomogeneity of the central hypodensity involving the left iliopsoas muscle.  The more focal well-defined area of hypodensity in the inferior portion of this muscle, overlying the region of the left femoral head, measures 2.8 x 2.0 cm.  A small amount of fluid is noted tracking along the interfascial space of the upper left quadriceps muscles.  Mild subcutaneous edema is present.  No lymphadenopathy.  Lumbar fusion hardware again noted.  IMPRESSION: Fusiform enlargement and hypodensity of the left iliopsoas muscle. Differential considerations include abscess, sterile fluid collection, or hematoma.  Findings discussed with Dr. Lazarus Salines by Dr. Chilton Si 04/17/12 825pm.  Small bilateral pleural effusions and associated compressive atelectasis.  Small pericardial effusion.  Original Report Authenticated By: Harrel Lemon, M.D.   Dg Chest 2 View  04/08/2012  *RADIOLOGY REPORT*  Clinical Data: Left-sided pain  CHEST - 2 VIEW  Comparison: 11/28/2006  Findings: Heart size is normal.  Mediastinal shadows  are normal. Lungs are clear.  No effusions.  No bony abnormalities.  IMPRESSION: Normal chest  Original Report Authenticated By: Thomasenia Sales, M.D.   Ct Soft Tissue Neck W Contrast  04/24/2012  *RADIOLOGY REPORT*  Clinical Data: 51 year old male with history of right neck and parasternal abscess. Status post  operative exploration and drainage. Additional acute infections at other site (extremities).  CT NECK WITH CONTRAST  Technique:  Multidetector CT imaging of the neck was performed with intravenous contrast.  Contrast: OMNIPAQUE IOHEXOL 300 MG/ML  SOLN  Comparison: Preoperative study 04/17/2012.  Findings: Postoperative changes with skin incision at the right supraclavicular neck.  Interval increased obscuration of normal fat planes in the around the right sternocleidomastoid and right pectoralis muscles.  Decreased but residual rim enhancing fluid collection, epicenter between the right lobe of the thyroid and right clavicle.  The right sternoclavicular joint remains involved (series 6 image 98).  There remains a subtle rim enhancing collection deep to the pectoralis muscle near the anterior first and second rib ends (image 97).  There remains a small volume of gas associated with these collections.  The rim enhancing component of the collection tracking cephalad along the deep sternocleidomastoid muscle on the right has mildly progressed.  Additional small gas foci more distally near the right shoulder may be incidental intravenous tear (the right extremity IV access and contrast injection for this exam.  Chest findings are reported separately.  The thyroid remains within normal limits except for mass effect on the right lobe.  Major vascular structures including the right internal jugular vein, right carotid and vertebral arteries remain patent.  Fluid in the retropharyngeal space has decreased.  Parapharyngeal and sublingual spaces remain normal.  Submandibular and parotid glands remain normal.  Pharynx and the larynx contours remain within normal limits.  Visualized orbit soft tissues are within normal limits.  Negative visualized brain parenchyma.  Minimal right sphenoid sinus mucosal thickening.  No acute osseous findings in the face, skull base or cervical spine.  IMPRESSION: 1.  Trans-spatial abscess  centered at the right thoracic inlet has modestly decreased following surgery, but significant disease persists - including tracking  along the deep margins of the right pectoralis and right sternocleidomastoid muscles, and associated with the right sternoclavicular joint.  The involvement along the right SCM has mildly progressed. 2.  Case reviewed in person with Dr. Lazarus Salines at [phone] hours on 04/24/2012.  Original Report Authenticated By: Harley Hallmark, M.D.   Ct Soft Tissue Neck W Contrast  04/17/2012  *RADIOLOGY REPORT*  Clinical Data:  Pain and swelling right arm and chest.  Fever and positive blood cultures.  CT NECK AND CHEST WITH CONTRAST  Technique:  Multidetector CT imaging of the neck and chest was performed using the standard protocol after bolus administration of intravenous contrast.  Contrast: OMNIPAQUE IOHEXOL 300 MG/ML  SOLN  Comparison:   None.  CT NECK  Findings:  Large fluid collection in the right neck with peripheral enhancement  and gas bubbles compatible with abscess.  The largest component of the  abscess is  in the right lower neck at the level of thyroid and  measures 6.4 x 3.0 cm.  Multiple loculations of abscess extending to the right sternoclavicular joint and retrosternal mediastinum.  The mediastinal abscess measures approximately 1.1 x 2.5 cm.  Abscess also extends deep to the pectoralis muscle on the right and extends superiorly along the posterior margin of the sternocleidomastoid muscle on the right. The sternocleidomastoid muscle  is enlarged and edematous due to myositis.  No bony destruction is seen involving the sternal clavicular joint to suggest osteomyelitis however  this appears to be  septic arthritis.  Carotid artery and jugular vein are patent bilaterally.  Trachea is deviated to the left due to mass effect.  No compromise of the airway.  Small cervical lymph nodes are present without pathologic adenopathy.  The thyroid is displaced on the right but not involved.   IMPRESSION: Large mass lesion in the right lower neck.  This extends into the sternal clavicular joint which appears to be involved with infection.  There is extension of abscess into  the superior mediastinum posterior to the manubrium.  There is also abscess extending deep to the pectoralis muscle.  CT CHEST  Findings: Small pleural effusions bilaterally, right greater than left.  Mild bibasilar atelectasis.  No definite pneumonia or lung abscess.  Negative for pericardial effusion.  Abscess extends posterior to the manubrium compatible with mediastinitis and abscess related to neck infection as described in the above report.  Imaging of the upper abdomen reveal distended gallbladder with fluid surrounding the gallbladder.  This may be due to cholecystitis.  Consider ultrasound is symptomatic.  IMPRESSION: Bilateral pleural effusions and bibasilar atelectasis.  Mediastinitis with abscess extending from the right neck into the superior mediastinum.  Pericholecystic fluid, suggestive of cholecystitis.  I discussed the findings by telephone with Algis Downs PA on 04/17/2012 at 1400 hours.  Original Report Authenticated By: Camelia Phenes, M.D.   Ct Chest W Contrast  04/24/2012  *RADIOLOGY REPORT*  Clinical Data: Abscess with extension into the chest.  CT CHEST WITH CONTRAST  Technique:  Multidetector CT imaging of the chest was performed following the standard protocol during bolus administration of intravenous contrast.  Contrast: OMNIPAQUE IOHEXOL 300 MG/ML  SOLN  Comparison: Chest CT 04/17/2012.  Findings:  Mediastinum: Heart size is normal. There is a small amount of pericardial fluid and/or thickening, with some mild pericardial enhancement, slightly increased compared to prior study 04/17/2012. The previously described collection of gas and fluid in the lower right sternocleidomastoid muscle extending to the sternoclavicular joint is again noted, appears slightly smaller than the prior examination.   Around the sternoclavicular joint there is some abnormal soft tissue thickening and enhancement, which encroaches upon the superior mediastinum.  No more inferior mediastinal extension of this process is identified on today's examination. No pathologically enlarged mediastinal or hilar lymph nodes. Esophagus is unremarkable in appearance.  A right upper extremity PICC is in place with tip terminating in the right atrium.  Lungs/Pleura: There are moderate right and small left-sided pleural effusions layering dependently, with associated passive atelectasis in the lower lobes of the lungs bilaterally.  No definite consolidative airspace disease.  No definite suspicious appearing pulmonary nodules or masses are identified.  Upper Abdomen: Visualized portions are unremarkable.  Musculoskeletal: Again noted is some extension of the abnormal enhancing fluid collection into the muscle belly of the right pectoralis major.  There is some small locules of gas within the right pectoralis major muscle, which may be indicative of gas- forming organisms, or recent surgical intervention. There are no aggressive appearing lytic or blastic lesions noted in the visualized portions of the skeleton.  IMPRESSION: 1.  Slight interval decrease in size of an abnormal enhancing gas containing fluid collection centered in the lower right sternocleidomastoid muscle around the right sternoclavicular joint with some extension of the right pectoralis muscle.  The at this time, there is some  inflammatory changes that result in local mass effect around the right sternoclavicular joint, without frank extension into the mediastinum. 2.  Moderate right and small left-sided pleural effusions layering dependently with associated dependent passive atelectasis in the lower lobes of the lungs bilaterally. 3.  Slight interval increase in the amount of pericardial fluid and/or thickening with pericardial enhancement compared to the prior study. These  findings can be seen in the setting of pericarditis.  Clinical correlation is recommended.  Original Report Authenticated By: Florencia Reasons, M.D.   Ct Chest W Contrast  04/17/2012  *RADIOLOGY REPORT*  Clinical Data:  Pain and swelling right arm and chest.  Fever and positive blood cultures.  CT NECK AND CHEST WITH CONTRAST  Technique:  Multidetector CT imaging of the neck and chest was performed using the standard protocol after bolus administration of intravenous contrast.  Contrast: OMNIPAQUE IOHEXOL 300 MG/ML  SOLN  Comparison:   None.  CT NECK  Findings:  Large fluid collection in the right neck with peripheral enhancement  and gas bubbles compatible with abscess.  The largest component of the  abscess is  in the right lower neck at the level of thyroid and  measures 6.4 x 3.0 cm.  Multiple loculations of abscess extending to the right sternoclavicular joint and retrosternal mediastinum.  The mediastinal abscess measures approximately 1.1 x 2.5 cm.  Abscess also extends deep to the pectoralis muscle on the right and extends superiorly along the posterior margin of the sternocleidomastoid muscle on the right. The sternocleidomastoid muscle is enlarged and edematous due to myositis.  No bony destruction is seen involving the sternal clavicular joint to suggest osteomyelitis however  this appears to be  septic arthritis.  Carotid artery and jugular vein are patent bilaterally.  Trachea is deviated to the left due to mass effect.  No compromise of the airway.  Small cervical lymph nodes are present without pathologic adenopathy.  The thyroid is displaced on the right but not involved.  IMPRESSION: Large mass lesion in the right lower neck.  This extends into the sternal clavicular joint which appears to be involved with infection.  There is extension of abscess into  the superior mediastinum posterior to the manubrium.  There is also abscess extending deep to the pectoralis muscle.  CT CHEST  Findings:  Small pleural effusions bilaterally, right greater than left.  Mild bibasilar atelectasis.  No definite pneumonia or lung abscess.  Negative for pericardial effusion.  Abscess extends posterior to the manubrium compatible with mediastinitis and abscess related to neck infection as described in the above report.  Imaging of the upper abdomen reveal distended gallbladder with fluid surrounding the gallbladder.  This may be due to cholecystitis.  Consider ultrasound is symptomatic.  IMPRESSION: Bilateral pleural effusions and bibasilar atelectasis.  Mediastinitis with abscess extending from the right neck into the superior mediastinum.  Pericholecystic fluid, suggestive of cholecystitis.  I discussed the findings by telephone with Algis Downs PA on 04/17/2012 at 1400 hours.  Original Report Authenticated By: Camelia Phenes, M.D.   [place] Abscess Drain  04/19/2012  *RADIOLOGY REPORT*  Clinical Data: Multiple infectious sites.  Left iliopsoas abscess suggested on CT.  ULTRASOUND GUIDED ASPIRATION  Comparison: CT from the previous day  Technique and findings:  Survey ultrasound of the left iliopsoas and hip region was performed and a   complex intramuscular collection was identified corresponding to the hypodensity seen on CT.  An appropriate skin entry site was determined.  Site was marked,  prepped with Betadine, draped in usual sterile fashion, infiltrated locally with 1% lidocaine. Under real time ultrasound guidance, an 18 gauge spinal needle was advanced into various facets of   the collection.  Only scant amount of bloody fluid could be aspirated.  Sample sent for Gram stain, culture and sensitivity. The patient tolerated the procedure well. No immediate complication.  IMPRESSION:  Aspiration of left iliopsoas process, returning only scant amount of bloody fluid, sent for Gram stain and culture.  Original Report Authenticated By: Osa Craver, M.D.   Ct Hand Left W Contrast  04/13/2012  *RADIOLOGY  REPORT*  Clinical Data: Pain, swelling, and erythema of the left forearm and hand.  CT OF THE LEFT HAND WITH CONTRAST  Technique:  Multidetector CT imaging was performed following the standard protocol during bolus administration of intravenous contrast.  Contrast: OMNIPAQUE IOHEXOL 300 MG/ML  SOLN  Comparison: Plain radiographs 04/12/2012  Findings: Diffuse low attenuation change throughout the intramuscular planes and subcutaneous soft tissues of the distal forearm and wrist.  Not loculated appearing fluid collection deep to the distal flexor or muscle and tendon compartment.  Changes are likely represent cellulitis, likely with tenosynovitis and edema. No discrete abscess cavity is demonstrated although focal abscess may be obscured.  MRI is suggested for more complete evaluation. Visualized bones appear intact.  There is no obvious fracture or bone lesion.  No cortical erosion or periosteal reaction to suggest osteomyelitis.  Degenerative changes.  IMPRESSION: Diffuse edema in the soft tissues and intramuscular planes of the distal forearm and wrist.  Volar fluid collection at the distal flexor tendons and muscles likely represents diffuse edema.  No definite localized abscess is demonstrated.  Consider MRI for better evaluation of these changes.  Original Report Authenticated By: Marlon Pel, M.D.   Mr Hand Left W Wo Contrast  04/18/2012  *RADIOLOGY REPORT*  Clinical Data: Sepsis.  Cellulitis.  MRI OF THE LEFT HAND WITHOUT AND WITH CONTRAST  Technique:  Multiplanar, multisequence MR imaging was performed both before and after administration of intravenous contrast.  Contrast: 15mL MULTIHANCE GADOBENATE DIMEGLUMINE 529 MG/ML IV SOLN  Comparison: CT scan dated 04/13/2012 and radiographs dated 04/12/2012  Findings: There is a 4.9 x 4.0 x 0.8 cm abscess between the pronator quadratus muscle and the flexor digitorum muscles in the distal left forearm.  There is also fluid and abnormal enhancement in  the distal radial ulnar joint consistent with a septic joint.  The patient has an effusion in the radiocarpal joint with enhancement of the synovium of that joint as well.  No other discrete abscesses in the forearm or hand.  No osteomyelitis.  The diffuse edema and enhancement of the subcutaneous tissues of the distal forearm and hand is consistent with cellulitis. There is also slight enhancement of the muscles just above the abscess consistent with myositis.  IMPRESSION: Focal abscess in the volar aspect of the distal forearm between the flexor digitorum muscles and the pronator quadratus.  Probable septic distal radial ulnar joint and radiocarpal joint.  Diffuse cellulitis.  Original Report Authenticated By: Gwynn Burly, M.D.   Mr Foot Right W Wo Contrast  04/22/2012  *RADIOLOGY REPORT*  Clinical Data: Right ankle pain and swelling.  History of sepsis.  MRI OF THE RIGHT FOREFOOT WITHOUT AND WITH CONTRAST  Technique:  Multiplanar, multisequence MR imaging was performed both before and after administration of intravenous contrast.  Contrast: 12mL MULTIHANCE GADOBENATE DIMEGLUMINE 529 MG/ML IV SOLN  Comparison: None  Findings: There  is diffuse subcutaneous soft tissue swelling/edema most notable along the dorsum of the foot.  There is also edema like signal abnormality involving the foot musculature and fluid in the fascial planes.  The findings suggest cellulitis and myofasciitis.  No discrete drainable soft tissue abscess.  No findings for septic arthritis or osteomyelitis.  The major ankle and foot tendons and ligaments are intact.  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  IMPRESSION:  1.  Cellulitis and myofasciitis without findings for septic arthritis, osteomyelitis or focal soft tissue abscess. 2.  Plantar fibroma.  Original Report Authenticated By: P. Loralie Champagne, M.D.   Dg Hand Complete Left  04/12/2012  *RADIOLOGY REPORT*  Clinical Data:  Left hand pain, swelling, and tenderness.  No known injury.  LEFT HAND - COMPLETE 3+ VIEW  Comparison: None.  Findings: Diffuse soft tissue swelling.  No radiopaque foreign bodies or gas collections in the subcutaneous tissues.  Bones appear intact without evidence of acute fracture or subluxation. Benign-appearing sclerosis in the distal phalanx of the fourth finger.  No destructive bone lesions or expansile lesions demonstrated.  Degenerative changes in the STT, first metacarpal phalangeal, and multiple interphalangeal joints.  No abnormal periosteal reaction.  Bone cortex and trabecular architecture appear intact.  IMPRESSION: Diffuse soft tissue swelling.  No acute bony abnormalities demonstrated.  Degenerative changes.  Benign-appearing bone sclerosis in the distal phalanx of the fourth finger.  Original Report Authenticated By: Marlon Pel, M.D.    Medications: Scheduled Meds:    . ampicillin-sulbactam (UNASYN) IV  3 g Intravenous Q6H  . colchicine  0.6 mg Oral BID  . enoxaparin (LOVENOX) injection  40 mg Subcutaneous Q24H  . metoprolol tartrate  25 mg Oral BID  . mupirocin ointment   Topical BID  . nystatin  10 mL Oral QID  . sodium chloride  3 mL Intravenous Q12H   Continuous Infusions:    . sodium chloride 50 mL/hr at 05/03/12 1191     Assessment/Plan: Active Problems: 1. Bacteremia due to Group A Streptococcus/ Sepsis, left wrist septic arthritis : Status post I&D, left wrist arthrotomy and drainage on 04/24/2012 and 04/25/2012 -  continue Unasyn. Appreciate orthopedics, Dr. Orlan Leavens again for followup on the wrist  2. Multiple septic foci/abscesses and cellulitis:  - Status post I and D's, ENT closely following, cont Unasyn   3. Acute hypoxic respiratory failure and Pleural effusion, bilateral with compressive atelectasis: Transient during hospitalization felt to be multifactorial, secondary to opioids, pleural effusions and compressive atelectasis.   4. Thrush:  Continue Nystatin swish and swallow.   5. Anemia:  Patient had a mild microcytic anemia, anemia panel revealed iron deficiency., May need outpatient GI workup   6. Gout: Patient has a history of gout, and on 4/30, had right 1st MTP joint swelling, redness and pain.  -Received 5-day course of oral Prednisone -completed on 5/5, pain and swelling improved while on prednisone but recurred after completing the prednisone  -Improved on colchicine(started on 5/6), continue to monitor closely   7. Hypertension: With multiple Elevated BP readings   - Continue Lopressor    DVT Prophylaxis: Lovenox  Code Status: Full code  Disposition: Currently not medically ready but patient may need short-term skilled nursing facility for dressing changes and IV antibiotics.   LOS: 21 days   Joren Rehm M.D. [facility] 05/03/2012, 1:01 PM Pager: [phone]

## 2012-05-03 NOTE — Progress Notes (Signed)
05/03/2012 9:26 AM  All, Karlin [id number]  Post-Op Day 9/16    Temp:  [99.2 F (37.3 C)-99.8 F (37.7 C)] 99.2 F (37.3 C) (05/10 0525) Pulse Rate:  [88-102] 92  (05/10 0525) Resp:  [18-20] 18  (05/10 0525) BP: (140-145)/(67-86) 145/75 mmHg (05/10 0525) SpO2:  [98 %-99 %] 98 % (05/10 0525),     Intake/Output Summary (Last 24 hours) at 05/03/12 0926 Last data filed at 05/03/12 0818  Gross per 24 hour  Intake   1340 ml  Output   2850 ml  Net  -1510 ml    No results found for this or any previous visit (from the past 24 hour(s)).  SUBJECTIVE:  Gradually better  OBJECTIVE:  sm amt cloudy drainage on dsg.  Min tenderness upper chest wall  IMPRESSION:  Satisfactory check  PLAN:  dsg changes.  Check WBC.  Flo Shanks

## 2012-05-03 NOTE — Progress Notes (Signed)
Occupational Therapy Treatment and Goals Updated Patient Details Name: [name] MRN: [id number] DOB: 1961/05/06 Today's Date: 05/03/2012 Time: 7829-5621 OT Time Calculation (min): 11 min  OT Assessment / Plan / Recommendation Comments on Treatment Session Pt. continues to benefit from skilled education on exercises for left UE due to pt. not carrying over proper techniques for left UE exercises.      Follow Up Recommendations  Home health OT       Equipment Recommendations  3 in 1 bedside comode       Frequency Min 3X/week   Plan Discharge plan remains appropriate    Precautions / Restrictions Precautions Precautions: Fall Restrictions Weight Bearing Restrictions: Yes LUE Weight Bearing: Weight bear through elbow only Other Position/Activity Restrictions: weight bear through elbow on left       ADL  ADL Comments: Pt. reports ambulating in hallway with platorm walker earlier with RN and completing left UE exercises throughout the day. Pt. continues to require verbal cues to properly perform exercises.      OT Goals Acute Rehab OT Goals OT Goal Formulation: With patient Time For Goal Achievement: 05/17/12 Potential to Achieve Goals: Good ADL Goals Pt Will Perform Eating: with modified independence;Sitting, chair;Sitting, edge of bed;Unsupported ADL Goal: Eating - Progress: Goal set today Pt Will Perform Grooming: with modified independence;Unsupported;Standing at sink ADL Goal: Grooming - Progress: Goal set today Pt Will Perform Upper Body Bathing: with modified independence;Unsupported ADL Goal: Upper Body Bathing - Progress: Goal set today Pt Will Perform Lower Body Bathing: with modified independence;Unsupported ADL Goal: Lower Body Bathing - Progress: Goal set today Pt Will Perform Upper Body Dressing: with modified independence;Supported;Sitting, chair;Sitting, bed ADL Goal: Upper Body Dressing - Progress: Goal set today Pt Will Perform Lower Body Dressing:  with modified independence;Sit to stand from chair;Sit to stand from bed;Unsupported ADL Goal: Lower Body Dressing - Progress: Goal set today Pt Will Transfer to Toilet: with modified independence;Ambulation;with DME;3-in-1 ADL Goal: Toilet Transfer - Progress: Goal set today Pt Will Perform Toileting - Clothing Manipulation: with modified independence;Standing ADL Goal: Toileting - Clothing Manipulation - Progress: Goal set today Pt Will Perform Toileting - Hygiene: Independently;Sit to stand from 3-in-1/toilet ADL Goal: Toileting - Hygiene - Progress: Goal set today Arm Goals Pt Will Tolerate PROM: to maintain range of motion;without pain;to decrease contracture;Left upper extremity;5 reps Arm Goal: PROM - Progress: Goal set today Additional Arm Goal #1: Pt will be Independent with LUE SROM shoulder, elbow, forearm, and digits; as well as keeping LUE elevated appropriately Arm Goal: Additional Goal #1 - Progress: Goal set today  Visit Information  Last OT Received On: 05/03/12 Assistance Needed: +1          Cognition  Overall Cognitive Status: Appears within functional limits for tasks assessed/performed Area of Impairment: Safety/judgement Arousal/Alertness: Awake/alert Orientation Level: Appears intact for tasks assessed Behavior During Session: [facility] for tasks performed Safety/Judgement: Decreased awareness of need for assistance    Mobility Bed Mobility Supine to Sit: 6: Modified independent (Device/Increase time) Sitting - Scoot to Edge of Bed: 6: Modified independent (Device/Increase time) Sit to Supine: 6: Modified independent (Device/Increase time)   Exercises General Exercises - Upper Extremity Shoulder Flexion: AROM;10 reps;Seated;Left Shoulder Horizontal ABduction: AROM;10 reps;Seated;Left Shoulder Horizontal ADduction: AROM;Left;10 reps;Seated Elbow Flexion: AROM;10 reps;Seated Elbow Extension: AROM;Left;10 reps Digit Composite Flexion: AROM;AAROM;Left;10  reps;Supine;Limitations Composite Extension: AROM;AAROM;Left;10 reps Hand Exercises Forearm Supination: AROM;10 reps;Left;Seated Forearm Pronation: AROM;Left;10 reps;Seated Thumb Abduction: AAROM;10 reps;Left;Seated Thumb Adduction: AAROM;Left;10 reps;Seated     End  of Session OT - End of Session Activity Tolerance: Patient limited by pain Patient left: in bed;with call bell/phone within reach;with family/visitor present   Cassandria Anger, OTR/L Pager [phone] 05/03/2012, 1:59 PM

## 2012-05-04 DIAGNOSIS — J9851 Mediastinitis: Secondary | ICD-10-CM

## 2012-05-04 DIAGNOSIS — A409 Streptococcal sepsis, unspecified: Secondary | ICD-10-CM

## 2012-05-04 DIAGNOSIS — R7881 Bacteremia: Secondary | ICD-10-CM

## 2012-05-04 DIAGNOSIS — L02519 Cutaneous abscess of unspecified hand: Secondary | ICD-10-CM

## 2012-05-04 DIAGNOSIS — L03119 Cellulitis of unspecified part of limb: Secondary | ICD-10-CM

## 2012-05-04 NOTE — Progress Notes (Signed)
Patient ID: [name]  male  ZOX:[id number]    DOB: Oct 19, 1961    DOA: 04/12/2012  PCP: Sheila Oats, MD, MD   Interim summary: - Per Dr. Smitty Knudsen note on 04/30/2012  Subjective: Denies any specific complaints,   Objective: Weight change:   Intake/Output Summary (Last 24 hours) at 05/04/12 1150 Last data filed at 05/04/12 0500  Gross per 24 hour  Intake   1660 ml  Output    950 ml  Net    710 ml   Blood pressure 135/72, pulse 91, temperature 98.3 F (36.8 C), temperature source Oral, resp. rate 18, height 5\' 4"  (1.626 m), weight 61 kg (134 lb 7.7 oz), SpO2 96.00%.  Physical Exam: General: Alert and awake, oriented x3, not in any acute distress. HEENT: anicteric sclera, pupils reactive to light and accommodation, EOMI, neck dressing clean dry and intact CVS: S1-S2 clear, no murmur rubs or gallops Chest: clear to auscultation bilaterally, no wheezing, rales or rhonchi Abdomen: soft nontender, nondistended, normal bowel sounds, no organomegaly Extremities: no cyanosis, clubbing or edema noted bilaterally. Left hand dressing intact.   Lab Results: Basic Metabolic Panel:  Lab 05/01/12 8119 04/30/12 0534  NA 135 136  K 4.0 3.4*  CL 98 99  CO2 28 27  GLUCOSE 100* 127*  BUN 3* 5*  CREATININE 0.64 0.64  CALCIUM 9.2 8.8  MG -- --  PHOS -- --   CBC:  Lab 04/30/12 0534 04/29/12 0515  WBC 9.1 9.1  NEUTROABS -- --  HGB 8.4* 8.8*  HCT 26.3* 27.3*  MCV 67.3* 67.6*  PLT 432* 485*    Studies/Results: Ct Abdomen Pelvis Wo Contrast  04/17/2012  *RADIOLOGY REPORT*  Clinical Data: Left groin pain  CT ABDOMEN AND PELVIS WITHOUT CONTRAST  Technique:  Multidetector CT imaging of the abdomen and pelvis was performed following the standard protocol without intravenous contrast.  Comparison: None.  Findings: Bilateral pleural effusions are noted with associated compressive atelectasis.  Small pericardial effusion is present.  Unenhanced CT was performed per clinician order.   Lack of IV contrast limits sensitivity and specificity, especially for evaluation of abdominal/pelvic solid viscera.  There is minimal retained contrast within nondilated renal collecting systems from contrast enhanced exam performed separately today.  There is minimal nonspecific peri nephric stranding bilaterally, right greater than left.  Hyperdense area of the peripheral renal cortex of the right upper renal pole image 32 measures 6 mm.  Unenhanced liver, spleen, pancreas, and adrenal glands are normal.  No hydronephrosis.  The bowel is grossly normal.  Bladder is normal.  There is fusiform enlargement and inhomogeneity of the central hypodensity involving the left iliopsoas muscle.  The more focal well-defined area of hypodensity in the inferior portion of this muscle, overlying the region of the left femoral head, measures 2.8 x 2.0 cm.  A small amount of fluid is noted tracking along the interfascial space of the upper left quadriceps muscles.  Mild subcutaneous edema is present.  No lymphadenopathy.  Lumbar fusion hardware again noted.  IMPRESSION: Fusiform enlargement and hypodensity of the left iliopsoas muscle. Differential considerations include abscess, sterile fluid collection, or hematoma.  Findings discussed with Dr. Lazarus Salines by Dr. Chilton Si 04/17/12 825pm.  Small bilateral pleural effusions and associated compressive atelectasis.  Small pericardial effusion.  Original Report Authenticated By: Harrel Lemon, M.D.   Dg Chest 2 View  04/08/2012  *RADIOLOGY REPORT*  Clinical Data: Left-sided pain  CHEST - 2 VIEW  Comparison: 11/28/2006  Findings: Heart size is normal.  Mediastinal shadows are normal. Lungs are clear.  No effusions.  No bony abnormalities.  IMPRESSION: Normal chest  Original Report Authenticated By: Thomasenia Sales, M.D.   Ct Soft Tissue Neck W Contrast  04/24/2012  *RADIOLOGY REPORT*  Clinical Data: 51 year old male with history of right neck and parasternal abscess. Status post  operative exploration and drainage. Additional acute infections at other site (extremities).  CT NECK WITH CONTRAST  Technique:  Multidetector CT imaging of the neck was performed with intravenous contrast.  Contrast: OMNIPAQUE IOHEXOL 300 MG/ML  SOLN  Comparison: Preoperative study 04/17/2012.  Findings: Postoperative changes with skin incision at the right supraclavicular neck.  Interval increased obscuration of normal fat planes in the around the right sternocleidomastoid and right pectoralis muscles.  Decreased but residual rim enhancing fluid collection, epicenter between the right lobe of the thyroid and right clavicle.  The right sternoclavicular joint remains involved (series 6 image 98).  There remains a subtle rim enhancing collection deep to the pectoralis muscle near the anterior first and second rib ends (image 97).  There remains a small volume of gas associated with these collections.  The rim enhancing component of the collection tracking cephalad along the deep sternocleidomastoid muscle on the right has mildly progressed.  Additional small gas foci more distally near the right shoulder may be incidental intravenous tear (the right extremity IV access and contrast injection for this exam.  Chest findings are reported separately.  The thyroid remains within normal limits except for mass effect on the right lobe.  Major vascular structures including the right internal jugular vein, right carotid and vertebral arteries remain patent.  Fluid in the retropharyngeal space has decreased.  Parapharyngeal and sublingual spaces remain normal.  Submandibular and parotid glands remain normal.  Pharynx and the larynx contours remain within normal limits.  Visualized orbit soft tissues are within normal limits.  Negative visualized brain parenchyma.  Minimal right sphenoid sinus mucosal thickening.  No acute osseous findings in the face, skull base or cervical spine.  IMPRESSION: 1.  Trans-spatial abscess  centered at the right thoracic inlet has modestly decreased following surgery, but significant disease persists - including tracking  along the deep margins of the right pectoralis and right sternocleidomastoid muscles, and associated with the right sternoclavicular joint.  The involvement along the right SCM has mildly progressed. 2.  Case reviewed in person with Dr. Lazarus Salines at [phone] hours on 04/24/2012.  Original Report Authenticated By: Harley Hallmark, M.D.   Ct Soft Tissue Neck W Contrast  04/17/2012  *RADIOLOGY REPORT*  Clinical Data:  Pain and swelling right arm and chest.  Fever and positive blood cultures.  CT NECK AND CHEST WITH CONTRAST  Technique:  Multidetector CT imaging of the neck and chest was performed using the standard protocol after bolus administration of intravenous contrast.  Contrast: OMNIPAQUE IOHEXOL 300 MG/ML  SOLN  Comparison:   None.  CT NECK  Findings:  Large fluid collection in the right neck with peripheral enhancement  and gas bubbles compatible with abscess.  The largest component of the  abscess is  in the right lower neck at the level of thyroid and  measures 6.4 x 3.0 cm.  Multiple loculations of abscess extending to the right sternoclavicular joint and retrosternal mediastinum.  The mediastinal abscess measures approximately 1.1 x 2.5 cm.  Abscess also extends deep to the pectoralis muscle on the right and extends superiorly along the posterior margin of the sternocleidomastoid muscle on the right. The  sternocleidomastoid muscle is enlarged and edematous due to myositis.  No bony destruction is seen involving the sternal clavicular joint to suggest osteomyelitis however  this appears to be  septic arthritis.  Carotid artery and jugular vein are patent bilaterally.  Trachea is deviated to the left due to mass effect.  No compromise of the airway.  Small cervical lymph nodes are present without pathologic adenopathy.  The thyroid is displaced on the right but not involved.   IMPRESSION: Large mass lesion in the right lower neck.  This extends into the sternal clavicular joint which appears to be involved with infection.  There is extension of abscess into  the superior mediastinum posterior to the manubrium.  There is also abscess extending deep to the pectoralis muscle.  CT CHEST  Findings: Small pleural effusions bilaterally, right greater than left.  Mild bibasilar atelectasis.  No definite pneumonia or lung abscess.  Negative for pericardial effusion.  Abscess extends posterior to the manubrium compatible with mediastinitis and abscess related to neck infection as described in the above report.  Imaging of the upper abdomen reveal distended gallbladder with fluid surrounding the gallbladder.  This may be due to cholecystitis.  Consider ultrasound is symptomatic.  IMPRESSION: Bilateral pleural effusions and bibasilar atelectasis.  Mediastinitis with abscess extending from the right neck into the superior mediastinum.  Pericholecystic fluid, suggestive of cholecystitis.  I discussed the findings by telephone with Algis Downs PA on 04/17/2012 at 1400 hours.  Original Report Authenticated By: Camelia Phenes, M.D.   Ct Chest W Contrast  04/24/2012  *RADIOLOGY REPORT*  Clinical Data: Abscess with extension into the chest.  CT CHEST WITH CONTRAST  Technique:  Multidetector CT imaging of the chest was performed following the standard protocol during bolus administration of intravenous contrast.  Contrast: OMNIPAQUE IOHEXOL 300 MG/ML  SOLN  Comparison: Chest CT 04/17/2012.  Findings:  Mediastinum: Heart size is normal. There is a small amount of pericardial fluid and/or thickening, with some mild pericardial enhancement, slightly increased compared to prior study 04/17/2012. The previously described collection of gas and fluid in the lower right sternocleidomastoid muscle extending to the sternoclavicular joint is again noted, appears slightly smaller than the prior examination.   Around the sternoclavicular joint there is some abnormal soft tissue thickening and enhancement, which encroaches upon the superior mediastinum.  No more inferior mediastinal extension of this process is identified on today's examination. No pathologically enlarged mediastinal or hilar lymph nodes. Esophagus is unremarkable in appearance.  A right upper extremity PICC is in place with tip terminating in the right atrium.  Lungs/Pleura: There are moderate right and small left-sided pleural effusions layering dependently, with associated passive atelectasis in the lower lobes of the lungs bilaterally.  No definite consolidative airspace disease.  No definite suspicious appearing pulmonary nodules or masses are identified.  Upper Abdomen: Visualized portions are unremarkable.  Musculoskeletal: Again noted is some extension of the abnormal enhancing fluid collection into the muscle belly of the right pectoralis major.  There is some small locules of gas within the right pectoralis major muscle, which may be indicative of gas- forming organisms, or recent surgical intervention. There are no aggressive appearing lytic or blastic lesions noted in the visualized portions of the skeleton.  IMPRESSION: 1.  Slight interval decrease in size of an abnormal enhancing gas containing fluid collection centered in the lower right sternocleidomastoid muscle around the right sternoclavicular joint with some extension of the right pectoralis muscle.  The at this time, there  is some inflammatory changes that result in local mass effect around the right sternoclavicular joint, without frank extension into the mediastinum. 2.  Moderate right and small left-sided pleural effusions layering dependently with associated dependent passive atelectasis in the lower lobes of the lungs bilaterally. 3.  Slight interval increase in the amount of pericardial fluid and/or thickening with pericardial enhancement compared to the prior study. These  findings can be seen in the setting of pericarditis.  Clinical correlation is recommended.  Original Report Authenticated By: Florencia Reasons, M.D.   Ct Chest W Contrast  04/17/2012  *RADIOLOGY REPORT*  Clinical Data:  Pain and swelling right arm and chest.  Fever and positive blood cultures.  CT NECK AND CHEST WITH CONTRAST  Technique:  Multidetector CT imaging of the neck and chest was performed using the standard protocol after bolus administration of intravenous contrast.  Contrast: OMNIPAQUE IOHEXOL 300 MG/ML  SOLN  Comparison:   None.  CT NECK  Findings:  Large fluid collection in the right neck with peripheral enhancement  and gas bubbles compatible with abscess.  The largest component of the  abscess is  in the right lower neck at the level of thyroid and  measures 6.4 x 3.0 cm.  Multiple loculations of abscess extending to the right sternoclavicular joint and retrosternal mediastinum.  The mediastinal abscess measures approximately 1.1 x 2.5 cm.  Abscess also extends deep to the pectoralis muscle on the right and extends superiorly along the posterior margin of the sternocleidomastoid muscle on the right. The sternocleidomastoid muscle is enlarged and edematous due to myositis.  No bony destruction is seen involving the sternal clavicular joint to suggest osteomyelitis however  this appears to be  septic arthritis.  Carotid artery and jugular vein are patent bilaterally.  Trachea is deviated to the left due to mass effect.  No compromise of the airway.  Small cervical lymph nodes are present without pathologic adenopathy.  The thyroid is displaced on the right but not involved.  IMPRESSION: Large mass lesion in the right lower neck.  This extends into the sternal clavicular joint which appears to be involved with infection.  There is extension of abscess into  the superior mediastinum posterior to the manubrium.  There is also abscess extending deep to the pectoralis muscle.  CT CHEST  Findings:  Small pleural effusions bilaterally, right greater than left.  Mild bibasilar atelectasis.  No definite pneumonia or lung abscess.  Negative for pericardial effusion.  Abscess extends posterior to the manubrium compatible with mediastinitis and abscess related to neck infection as described in the above report.  Imaging of the upper abdomen reveal distended gallbladder with fluid surrounding the gallbladder.  This may be due to cholecystitis.  Consider ultrasound is symptomatic.  IMPRESSION: Bilateral pleural effusions and bibasilar atelectasis.  Mediastinitis with abscess extending from the right neck into the superior mediastinum.  Pericholecystic fluid, suggestive of cholecystitis.  I discussed the findings by telephone with Algis Downs PA on 04/17/2012 at 1400 hours.  Original Report Authenticated By: Camelia Phenes, M.D.   [place] Abscess Drain  04/19/2012  *RADIOLOGY REPORT*  Clinical Data: Multiple infectious sites.  Left iliopsoas abscess suggested on CT.  ULTRASOUND GUIDED ASPIRATION  Comparison: CT from the previous day  Technique and findings:  Survey ultrasound of the left iliopsoas and hip region was performed and a   complex intramuscular collection was identified corresponding to the hypodensity seen on CT.  An appropriate skin entry site was determined.  Site  was marked, prepped with Betadine, draped in usual sterile fashion, infiltrated locally with 1% lidocaine. Under real time ultrasound guidance, an 18 gauge spinal needle was advanced into various facets of   the collection.  Only scant amount of bloody fluid could be aspirated.  Sample sent for Gram stain, culture and sensitivity. The patient tolerated the procedure well. No immediate complication.  IMPRESSION:  Aspiration of left iliopsoas process, returning only scant amount of bloody fluid, sent for Gram stain and culture.  Original Report Authenticated By: Osa Craver, M.D.   Ct Hand Left W Contrast  04/13/2012  *RADIOLOGY  REPORT*  Clinical Data: Pain, swelling, and erythema of the left forearm and hand.  CT OF THE LEFT HAND WITH CONTRAST  Technique:  Multidetector CT imaging was performed following the standard protocol during bolus administration of intravenous contrast.  Contrast: OMNIPAQUE IOHEXOL 300 MG/ML  SOLN  Comparison: Plain radiographs 04/12/2012  Findings: Diffuse low attenuation change throughout the intramuscular planes and subcutaneous soft tissues of the distal forearm and wrist.  Not loculated appearing fluid collection deep to the distal flexor or muscle and tendon compartment.  Changes are likely represent cellulitis, likely with tenosynovitis and edema. No discrete abscess cavity is demonstrated although focal abscess may be obscured.  MRI is suggested for more complete evaluation. Visualized bones appear intact.  There is no obvious fracture or bone lesion.  No cortical erosion or periosteal reaction to suggest osteomyelitis.  Degenerative changes.  IMPRESSION: Diffuse edema in the soft tissues and intramuscular planes of the distal forearm and wrist.  Volar fluid collection at the distal flexor tendons and muscles likely represents diffuse edema.  No definite localized abscess is demonstrated.  Consider MRI for better evaluation of these changes.  Original Report Authenticated By: Marlon Pel, M.D.   Mr Hand Left W Wo Contrast  04/18/2012  *RADIOLOGY REPORT*  Clinical Data: Sepsis.  Cellulitis.  MRI OF THE LEFT HAND WITHOUT AND WITH CONTRAST  Technique:  Multiplanar, multisequence MR imaging was performed both before and after administration of intravenous contrast.  Contrast: 15mL MULTIHANCE GADOBENATE DIMEGLUMINE 529 MG/ML IV SOLN  Comparison: CT scan dated 04/13/2012 and radiographs dated 04/12/2012  Findings: There is a 4.9 x 4.0 x 0.8 cm abscess between the pronator quadratus muscle and the flexor digitorum muscles in the distal left forearm.  There is also fluid and abnormal enhancement in  the distal radial ulnar joint consistent with a septic joint.  The patient has an effusion in the radiocarpal joint with enhancement of the synovium of that joint as well.  No other discrete abscesses in the forearm or hand.  No osteomyelitis.  The diffuse edema and enhancement of the subcutaneous tissues of the distal forearm and hand is consistent with cellulitis. There is also slight enhancement of the muscles just above the abscess consistent with myositis.  IMPRESSION: Focal abscess in the volar aspect of the distal forearm between the flexor digitorum muscles and the pronator quadratus.  Probable septic distal radial ulnar joint and radiocarpal joint.  Diffuse cellulitis.  Original Report Authenticated By: Gwynn Burly, M.D.   Mr Foot Right W Wo Contrast  04/22/2012  *RADIOLOGY REPORT*  Clinical Data: Right ankle pain and swelling.  History of sepsis.  MRI OF THE RIGHT FOREFOOT WITHOUT AND WITH CONTRAST  Technique:  Multiplanar, multisequence MR imaging was performed both before and after administration of intravenous contrast.  Contrast: 12mL MULTIHANCE GADOBENATE DIMEGLUMINE 529 MG/ML IV SOLN  Comparison: None  Findings: There is diffuse subcutaneous soft tissue swelling/edema most notable along the dorsum of the foot.  There is also edema like signal abnormality involving the foot musculature and fluid in the fascial planes.  The findings suggest cellulitis and myofasciitis.  No discrete drainable soft tissue abscess.  No findings for septic arthritis or osteomyelitis.  The major ankle and foot tendons and ligaments are intact.  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  IMPRESSION:  1.  Cellulitis and myofasciitis without findings for septic arthritis, osteomyelitis or focal soft tissue abscess. 2.  Plantar fibroma.  Original Report Authenticated By: P. Loralie Champagne, M.D.   Dg Hand Complete Left  04/12/2012  *RADIOLOGY REPORT*  Clinical Data:  Left hand pain, swelling, and tenderness.  No known injury.  LEFT HAND - COMPLETE 3+ VIEW  Comparison: None.  Findings: Diffuse soft tissue swelling.  No radiopaque foreign bodies or gas collections in the subcutaneous tissues.  Bones appear intact without evidence of acute fracture or subluxation. Benign-appearing sclerosis in the distal phalanx of the fourth finger.  No destructive bone lesions or expansile lesions demonstrated.  Degenerative changes in the STT, first metacarpal phalangeal, and multiple interphalangeal joints.  No abnormal periosteal reaction.  Bone cortex and trabecular architecture appear intact.  IMPRESSION: Diffuse soft tissue swelling.  No acute bony abnormalities demonstrated.  Degenerative changes.  Benign-appearing bone sclerosis in the distal phalanx of the fourth finger.  Original Report Authenticated By: Marlon Pel, M.D.    Medications: Scheduled Meds:    . ampicillin-sulbactam (UNASYN) IV  3 g Intravenous Q6H  . colchicine  0.6 mg Oral BID  . enoxaparin (LOVENOX) injection  40 mg Subcutaneous Q24H  . metoprolol tartrate  25 mg Oral BID  . mupirocin ointment   Topical BID  . nystatin  10 mL Oral QID  . nystatin   Topical BID  . sodium chloride  3 mL Intravenous Q12H   Continuous Infusions:    . sodium chloride 50 mL/hr at 05/04/12 0934     Assessment/Plan:   1. Bacteremia due to Group A Streptococcus/ Sepsis, left wrist septic arthritis : Status post I&D, left wrist arthrotomy and drainage on 04/24/2012 and 04/25/2012 -  continue Unasyn. Appreciate orthopedics for followup on the wrist  2. Multiple septic foci/abscesses and cellulitis:  - Status post I and D's, ENT closely following, cont Unasyn   3. Acute hypoxic respiratory failure and Pleural effusion, bilateral with compressive atelectasis: Transient during hospitalization felt to be multifactorial, secondary to opioids, pleural effusions and compressive atelectasis.   4. Thrush: Continue  Nystatin swish and swallow.   5. Anemia:  Patient had a mild microcytic anemia, anemia panel revealed iron deficiency., May need outpatient GI workup   6. Gout: Patient has a history of gout, and on 4/30, had right 1st MTP joint swelling, redness and pain.  -Received 5-day course of oral Prednisone -completed on 5/5, pain and swelling improved while on prednisone but recurred after completing the prednisone  -Improved on colchicine(started on 5/6), continue to monitor closely   7. Hypertension: With multiple Elevated BP readings   - Continue Lopressor    DVT Prophylaxis: Lovenox  Code Status: Full code  Disposition: Currently not medically ready but patient may need short-term skilled nursing facility for dressing changes and IV antibiotics.    LOS: 22 days   Mariateresa Batra M.D. [facility] 05/04/2012, 11:50 AM Pager: [phone]

## 2012-05-04 NOTE — Progress Notes (Signed)
05/04/2012 12:50 PM  Wittwer, Tedd [id number]  Post-Op Day 10/17    Temp:  [98.3 F (36.8 C)-98.8 F (37.1 C)] 98.3 F (36.8 C) (05/11 0600) Pulse Rate:  [87-101] 91  (05/11 0930) Resp:  [18-20] 18  (05/11 0600) BP: (133-144)/(70-92) 135/72 mmHg (05/11 0930) SpO2:  [96 %-100 %] 96 % (05/11 0600),     Intake/Output Summary (Last 24 hours) at 05/04/12 1250 Last data filed at 05/04/12 0500  Gross per 24 hour  Intake   1660 ml  Output    950 ml  Net    710 ml    No results found for this or any previous visit (from the past 24 hour(s)).  SUBJECTIVE:  Still concerned about LEFT hand.  Neck feeling good.  OBJECTIVE:  Minimal drainage.  Dsg.  Changed.  IMPRESSION:  Satisfactory check  PLAN:  Probably ready for out of hospital disposition from my standpoint.  Could shower neck wound if desired.  Flo Shanks

## 2012-05-05 DIAGNOSIS — R7881 Bacteremia: Secondary | ICD-10-CM

## 2012-05-05 DIAGNOSIS — L02519 Cutaneous abscess of unspecified hand: Secondary | ICD-10-CM

## 2012-05-05 DIAGNOSIS — L03119 Cellulitis of unspecified part of limb: Secondary | ICD-10-CM

## 2012-05-05 DIAGNOSIS — A409 Streptococcal sepsis, unspecified: Secondary | ICD-10-CM

## 2012-05-05 DIAGNOSIS — J9851 Mediastinitis: Secondary | ICD-10-CM

## 2012-05-05 LAB — BASIC METABOLIC PANEL
BUN: 3 mg/dL — ABNORMAL LOW (ref 6–23)
Creatinine, Ser: 0.67 mg/dL (ref 0.50–1.35)
GFR calc non Af Amer: >90 mL/min (ref >90)

## 2012-05-05 LAB — CBC
HCT: 26.5 % — ABNORMAL LOW (ref 39.0–52.0)
MCHC: 31.7 g/dL (ref 30.0–36.0)
MCV: 66.9 fL — ABNORMAL LOW (ref 78.0–100.0)
Platelets: 479 10*3/uL — ABNORMAL HIGH (ref 150–400)
RDW: 15.2 % (ref 11.5–15.5)
WBC: 7.7 10*3/uL (ref 4.0–10.5)

## 2012-05-05 NOTE — Progress Notes (Signed)
Pt seen/examined Wounds examined Looks much better New dressing applied Keep bandage on at all times Will need to see in my office or again in 7-10 days to take out sutures ot consult for digital exercises and use of left hand.

## 2012-05-05 NOTE — Progress Notes (Signed)
Patient ID: [name]  male  ZOX:[id number]    DOB: [birth year]    DOA: 04/12/2012  PCP: Sheila Oats, MD, MD   Interim summary: - Per Dr. Smitty Knudsen note on 04/30/2012  Subjective: Denies any specific complaints   Objective: Weight change:   Intake/Output Summary (Last 24 hours) at 05/05/12 1321 Last data filed at 05/05/12 1122  Gross per 24 hour  Intake 2540.33 ml  Output   3426 ml  Net -885.67 ml   Blood pressure 129/87, pulse 74, temperature 98.4 F (36.9 C), temperature source Oral, resp. rate 18, height 5\' 4"  (1.626 m), weight 61 kg (134 lb 7.7 oz), SpO2 98.00%.  Physical Exam: General: Alert and awake, oriented x3, not in any acute distress. HEENT: anicteric sclera, pupils reactive to light and accommodation, EOMI, neck dressing clean dry and intact CVS: S1-S2 clear, no murmur rubs or gallops Chest: clear to auscultation bilaterally, no wheezing, rales or rhonchi Abdomen: soft nontender, nondistended, normal bowel sounds, no organomegaly Extremities: no cyanosis, clubbing or edema noted bilaterally. Left hand dressing intact.   Lab Results: Basic Metabolic Panel:  Lab 05/05/12 8119 05/01/12 0805  NA 138 135  K 3.7 4.0  CL 99 98  CO2 27 28  GLUCOSE 92 100*  BUN 3* 3*  CREATININE 0.67 0.64  CALCIUM 9.1 9.2  MG -- --  PHOS -- --   CBC:  Lab 05/05/12 0615 04/30/12 0534  WBC 7.7 9.1  NEUTROABS -- --  HGB 8.4* 8.4*  HCT 26.5* 26.3*  MCV 66.9* 67.3*  PLT 479* 432*    Studies/Results: Ct Abdomen Pelvis Wo Contrast  04/17/2012  *RADIOLOGY REPORT*  Clinical Data: Left groin pain  CT ABDOMEN AND PELVIS WITHOUT CONTRAST  Technique:  Multidetector CT imaging of the abdomen and pelvis was performed following the standard protocol without intravenous contrast.  Comparison: None.  Findings: Bilateral pleural effusions are noted with associated compressive atelectasis.  Small pericardial effusion is present.  Unenhanced CT was performed per clinician order.   Lack of IV contrast limits sensitivity and specificity, especially for evaluation of abdominal/pelvic solid viscera.  There is minimal retained contrast within nondilated renal collecting systems from contrast enhanced exam performed separately today.  There is minimal nonspecific peri nephric stranding bilaterally, right greater than left.  Hyperdense area of the peripheral renal cortex of the right upper renal pole image 32 measures 6 mm.  Unenhanced liver, spleen, pancreas, and adrenal glands are normal.  No hydronephrosis.  The bowel is grossly normal.  Bladder is normal.  There is fusiform enlargement and inhomogeneity of the central hypodensity involving the left iliopsoas muscle.  The more focal well-defined area of hypodensity in the inferior portion of this muscle, overlying the region of the left femoral head, measures 2.8 x 2.0 cm.  A small amount of fluid is noted tracking along the interfascial space of the upper left quadriceps muscles.  Mild subcutaneous edema is present.  No lymphadenopathy.  Lumbar fusion hardware again noted.  IMPRESSION: Fusiform enlargement and hypodensity of the left iliopsoas muscle. Differential considerations include abscess, sterile fluid collection, or hematoma.  Findings discussed with Dr. Lazarus Salines by Dr. Chilton Si 04/17/12 825pm.  Small bilateral pleural effusions and associated compressive atelectasis.  Small pericardial effusion.  Original Report Authenticated By: Harrel Lemon, M.D.   Dg Chest 2 View  04/08/2012  *RADIOLOGY REPORT*  Clinical Data: Left-sided pain  CHEST - 2 VIEW  Comparison: 11/28/2006  Findings: Heart size is normal.  Mediastinal shadows are normal. Lungs  are clear.  No effusions.  No bony abnormalities.  IMPRESSION: Normal chest  Original Report Authenticated By: Thomasenia Sales, M.D.   Ct Soft Tissue Neck W Contrast  04/24/2012  *RADIOLOGY REPORT*  Clinical Data: 51 year old male with history of right neck and parasternal abscess. Status post  operative exploration and drainage. Additional acute infections at other site (extremities).  CT NECK WITH CONTRAST  Technique:  Multidetector CT imaging of the neck was performed with intravenous contrast.  Contrast: OMNIPAQUE IOHEXOL 300 MG/ML  SOLN  Comparison: Preoperative study 04/17/2012.  Findings: Postoperative changes with skin incision at the right supraclavicular neck.  Interval increased obscuration of normal fat planes in the around the right sternocleidomastoid and right pectoralis muscles.  Decreased but residual rim enhancing fluid collection, epicenter between the right lobe of the thyroid and right clavicle.  The right sternoclavicular joint remains involved (series 6 image 98).  There remains a subtle rim enhancing collection deep to the pectoralis muscle near the anterior first and second rib ends (image 97).  There remains a small volume of gas associated with these collections.  The rim enhancing component of the collection tracking cephalad along the deep sternocleidomastoid muscle on the right has mildly progressed.  Additional small gas foci more distally near the right shoulder may be incidental intravenous tear (the right extremity IV access and contrast injection for this exam.  Chest findings are reported separately.  The thyroid remains within normal limits except for mass effect on the right lobe.  Major vascular structures including the right internal jugular vein, right carotid and vertebral arteries remain patent.  Fluid in the retropharyngeal space has decreased.  Parapharyngeal and sublingual spaces remain normal.  Submandibular and parotid glands remain normal.  Pharynx and the larynx contours remain within normal limits.  Visualized orbit soft tissues are within normal limits.  Negative visualized brain parenchyma.  Minimal right sphenoid sinus mucosal thickening.  No acute osseous findings in the face, skull base or cervical spine.  IMPRESSION: 1.  Trans-spatial abscess  centered at the right thoracic inlet has modestly decreased following surgery, but significant disease persists - including tracking  along the deep margins of the right pectoralis and right sternocleidomastoid muscles, and associated with the right sternoclavicular joint.  The involvement along the right SCM has mildly progressed. 2.  Case reviewed in person with Dr. Lazarus Salines at [phone] hours on 04/24/2012.  Original Report Authenticated By: Harley Hallmark, M.D.   Ct Soft Tissue Neck W Contrast  04/17/2012  *RADIOLOGY REPORT*  Clinical Data:  Pain and swelling right arm and chest.  Fever and positive blood cultures.  CT NECK AND CHEST WITH CONTRAST  Technique:  Multidetector CT imaging of the neck and chest was performed using the standard protocol after bolus administration of intravenous contrast.  Contrast: OMNIPAQUE IOHEXOL 300 MG/ML  SOLN  Comparison:   None.  CT NECK  Findings:  Large fluid collection in the right neck with peripheral enhancement  and gas bubbles compatible with abscess.  The largest component of the  abscess is  in the right lower neck at the level of thyroid and  measures 6.4 x 3.0 cm.  Multiple loculations of abscess extending to the right sternoclavicular joint and retrosternal mediastinum.  The mediastinal abscess measures approximately 1.1 x 2.5 cm.  Abscess also extends deep to the pectoralis muscle on the right and extends superiorly along the posterior margin of the sternocleidomastoid muscle on the right. The sternocleidomastoid muscle is enlarged and  edematous due to myositis.  No bony destruction is seen involving the sternal clavicular joint to suggest osteomyelitis however  this appears to be  septic arthritis.  Carotid artery and jugular vein are patent bilaterally.  Trachea is deviated to the left due to mass effect.  No compromise of the airway.  Small cervical lymph nodes are present without pathologic adenopathy.  The thyroid is displaced on the right but not involved.   IMPRESSION: Large mass lesion in the right lower neck.  This extends into the sternal clavicular joint which appears to be involved with infection.  There is extension of abscess into  the superior mediastinum posterior to the manubrium.  There is also abscess extending deep to the pectoralis muscle.  CT CHEST  Findings: Small pleural effusions bilaterally, right greater than left.  Mild bibasilar atelectasis.  No definite pneumonia or lung abscess.  Negative for pericardial effusion.  Abscess extends posterior to the manubrium compatible with mediastinitis and abscess related to neck infection as described in the above report.  Imaging of the upper abdomen reveal distended gallbladder with fluid surrounding the gallbladder.  This may be due to cholecystitis.  Consider ultrasound is symptomatic.  IMPRESSION: Bilateral pleural effusions and bibasilar atelectasis.  Mediastinitis with abscess extending from the right neck into the superior mediastinum.  Pericholecystic fluid, suggestive of cholecystitis.  I discussed the findings by telephone with Algis Downs PA on 04/17/2012 at 1400 hours.  Original Report Authenticated By: Camelia Phenes, M.D.   Ct Chest W Contrast  04/24/2012  *RADIOLOGY REPORT*  Clinical Data: Abscess with extension into the chest.  CT CHEST WITH CONTRAST  Technique:  Multidetector CT imaging of the chest was performed following the standard protocol during bolus administration of intravenous contrast.  Contrast: OMNIPAQUE IOHEXOL 300 MG/ML  SOLN  Comparison: Chest CT 04/17/2012.  Findings:  Mediastinum: Heart size is normal. There is a small amount of pericardial fluid and/or thickening, with some mild pericardial enhancement, slightly increased compared to prior study 04/17/2012. The previously described collection of gas and fluid in the lower right sternocleidomastoid muscle extending to the sternoclavicular joint is again noted, appears slightly smaller than the prior examination.   Around the sternoclavicular joint there is some abnormal soft tissue thickening and enhancement, which encroaches upon the superior mediastinum.  No more inferior mediastinal extension of this process is identified on today's examination. No pathologically enlarged mediastinal or hilar lymph nodes. Esophagus is unremarkable in appearance.  A right upper extremity PICC is in place with tip terminating in the right atrium.  Lungs/Pleura: There are moderate right and small left-sided pleural effusions layering dependently, with associated passive atelectasis in the lower lobes of the lungs bilaterally.  No definite consolidative airspace disease.  No definite suspicious appearing pulmonary nodules or masses are identified.  Upper Abdomen: Visualized portions are unremarkable.  Musculoskeletal: Again noted is some extension of the abnormal enhancing fluid collection into the muscle belly of the right pectoralis major.  There is some small locules of gas within the right pectoralis major muscle, which may be indicative of gas- forming organisms, or recent surgical intervention. There are no aggressive appearing lytic or blastic lesions noted in the visualized portions of the skeleton.  IMPRESSION: 1.  Slight interval decrease in size of an abnormal enhancing gas containing fluid collection centered in the lower right sternocleidomastoid muscle around the right sternoclavicular joint with some extension of the right pectoralis muscle.  The at this time, there is some inflammatory changes that  result in local mass effect around the right sternoclavicular joint, without frank extension into the mediastinum. 2.  Moderate right and small left-sided pleural effusions layering dependently with associated dependent passive atelectasis in the lower lobes of the lungs bilaterally. 3.  Slight interval increase in the amount of pericardial fluid and/or thickening with pericardial enhancement compared to the prior study. These  findings can be seen in the setting of pericarditis.  Clinical correlation is recommended.  Original Report Authenticated By: Florencia Reasons, M.D.   Ct Chest W Contrast  04/17/2012  *RADIOLOGY REPORT*  Clinical Data:  Pain and swelling right arm and chest.  Fever and positive blood cultures.  CT NECK AND CHEST WITH CONTRAST  Technique:  Multidetector CT imaging of the neck and chest was performed using the standard protocol after bolus administration of intravenous contrast.  Contrast: OMNIPAQUE IOHEXOL 300 MG/ML  SOLN  Comparison:   None.  CT NECK  Findings:  Large fluid collection in the right neck with peripheral enhancement  and gas bubbles compatible with abscess.  The largest component of the  abscess is  in the right lower neck at the level of thyroid and  measures 6.4 x 3.0 cm.  Multiple loculations of abscess extending to the right sternoclavicular joint and retrosternal mediastinum.  The mediastinal abscess measures approximately 1.1 x 2.5 cm.  Abscess also extends deep to the pectoralis muscle on the right and extends superiorly along the posterior margin of the sternocleidomastoid muscle on the right. The sternocleidomastoid muscle is enlarged and edematous due to myositis.  No bony destruction is seen involving the sternal clavicular joint to suggest osteomyelitis however  this appears to be  septic arthritis.  Carotid artery and jugular vein are patent bilaterally.  Trachea is deviated to the left due to mass effect.  No compromise of the airway.  Small cervical lymph nodes are present without pathologic adenopathy.  The thyroid is displaced on the right but not involved.  IMPRESSION: Large mass lesion in the right lower neck.  This extends into the sternal clavicular joint which appears to be involved with infection.  There is extension of abscess into  the superior mediastinum posterior to the manubrium.  There is also abscess extending deep to the pectoralis muscle.  CT CHEST  Findings:  Small pleural effusions bilaterally, right greater than left.  Mild bibasilar atelectasis.  No definite pneumonia or lung abscess.  Negative for pericardial effusion.  Abscess extends posterior to the manubrium compatible with mediastinitis and abscess related to neck infection as described in the above report.  Imaging of the upper abdomen reveal distended gallbladder with fluid surrounding the gallbladder.  This may be due to cholecystitis.  Consider ultrasound is symptomatic.  IMPRESSION: Bilateral pleural effusions and bibasilar atelectasis.  Mediastinitis with abscess extending from the right neck into the superior mediastinum.  Pericholecystic fluid, suggestive of cholecystitis.  I discussed the findings by telephone with Algis Downs PA on 04/17/2012 at 1400 hours.  Original Report Authenticated By: Camelia Phenes, M.D.   [place] Abscess Drain  04/19/2012  *RADIOLOGY REPORT*  Clinical Data: Multiple infectious sites.  Left iliopsoas abscess suggested on CT.  ULTRASOUND GUIDED ASPIRATION  Comparison: CT from the previous day  Technique and findings:  Survey ultrasound of the left iliopsoas and hip region was performed and a   complex intramuscular collection was identified corresponding to the hypodensity seen on CT.  An appropriate skin entry site was determined.  Site was marked, prepped with Betadine,  draped in usual sterile fashion, infiltrated locally with 1% lidocaine. Under real time ultrasound guidance, an 18 gauge spinal needle was advanced into various facets of   the collection.  Only scant amount of bloody fluid could be aspirated.  Sample sent for Gram stain, culture and sensitivity. The patient tolerated the procedure well. No immediate complication.  IMPRESSION:  Aspiration of left iliopsoas process, returning only scant amount of bloody fluid, sent for Gram stain and culture.  Original Report Authenticated By: Osa Craver, M.D.   Ct Hand Left W Contrast  04/13/2012  *RADIOLOGY  REPORT*  Clinical Data: Pain, swelling, and erythema of the left forearm and hand.  CT OF THE LEFT HAND WITH CONTRAST  Technique:  Multidetector CT imaging was performed following the standard protocol during bolus administration of intravenous contrast.  Contrast: OMNIPAQUE IOHEXOL 300 MG/ML  SOLN  Comparison: Plain radiographs 04/12/2012  Findings: Diffuse low attenuation change throughout the intramuscular planes and subcutaneous soft tissues of the distal forearm and wrist.  Not loculated appearing fluid collection deep to the distal flexor or muscle and tendon compartment.  Changes are likely represent cellulitis, likely with tenosynovitis and edema. No discrete abscess cavity is demonstrated although focal abscess may be obscured.  MRI is suggested for more complete evaluation. Visualized bones appear intact.  There is no obvious fracture or bone lesion.  No cortical erosion or periosteal reaction to suggest osteomyelitis.  Degenerative changes.  IMPRESSION: Diffuse edema in the soft tissues and intramuscular planes of the distal forearm and wrist.  Volar fluid collection at the distal flexor tendons and muscles likely represents diffuse edema.  No definite localized abscess is demonstrated.  Consider MRI for better evaluation of these changes.  Original Report Authenticated By: Marlon Pel, M.D.   Mr Hand Left W Wo Contrast  04/18/2012  *RADIOLOGY REPORT*  Clinical Data: Sepsis.  Cellulitis.  MRI OF THE LEFT HAND WITHOUT AND WITH CONTRAST  Technique:  Multiplanar, multisequence MR imaging was performed both before and after administration of intravenous contrast.  Contrast: 15mL MULTIHANCE GADOBENATE DIMEGLUMINE 529 MG/ML IV SOLN  Comparison: CT scan dated 04/13/2012 and radiographs dated 04/12/2012  Findings: There is a 4.9 x 4.0 x 0.8 cm abscess between the pronator quadratus muscle and the flexor digitorum muscles in the distal left forearm.  There is also fluid and abnormal enhancement in  the distal radial ulnar joint consistent with a septic joint.  The patient has an effusion in the radiocarpal joint with enhancement of the synovium of that joint as well.  No other discrete abscesses in the forearm or hand.  No osteomyelitis.  The diffuse edema and enhancement of the subcutaneous tissues of the distal forearm and hand is consistent with cellulitis. There is also slight enhancement of the muscles just above the abscess consistent with myositis.  IMPRESSION: Focal abscess in the volar aspect of the distal forearm between the flexor digitorum muscles and the pronator quadratus.  Probable septic distal radial ulnar joint and radiocarpal joint.  Diffuse cellulitis.  Original Report Authenticated By: Gwynn Burly, M.D.   Mr Foot Right W Wo Contrast  04/22/2012  *RADIOLOGY REPORT*  Clinical Data: Right ankle pain and swelling.  History of sepsis.  MRI OF THE RIGHT FOREFOOT WITHOUT AND WITH CONTRAST  Technique:  Multiplanar, multisequence MR imaging was performed both before and after administration of intravenous contrast.  Contrast: 12mL MULTIHANCE GADOBENATE DIMEGLUMINE 529 MG/ML IV SOLN  Comparison: None  Findings: There is diffuse subcutaneous  soft tissue swelling/edema most notable along the dorsum of the foot.  There is also edema like signal abnormality involving the foot musculature and fluid in the fascial planes.  The findings suggest cellulitis and myofasciitis.  No discrete drainable soft tissue abscess.  No findings for septic arthritis or osteomyelitis.  The major ankle and foot tendons and ligaments are intact.  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  IMPRESSION:  1.  Cellulitis and myofasciitis without findings for septic arthritis, osteomyelitis or focal soft tissue abscess. 2.  Plantar fibroma.  Original Report Authenticated By: P. Loralie Champagne, M.D.   Dg Hand Complete Left  04/12/2012  *RADIOLOGY REPORT*  Clinical Data:  Left hand pain, swelling, and tenderness.  No known injury.  LEFT HAND - COMPLETE 3+ VIEW  Comparison: None.  Findings: Diffuse soft tissue swelling.  No radiopaque foreign bodies or gas collections in the subcutaneous tissues.  Bones appear intact without evidence of acute fracture or subluxation. Benign-appearing sclerosis in the distal phalanx of the fourth finger.  No destructive bone lesions or expansile lesions demonstrated.  Degenerative changes in the STT, first metacarpal phalangeal, and multiple interphalangeal joints.  No abnormal periosteal reaction.  Bone cortex and trabecular architecture appear intact.  IMPRESSION: Diffuse soft tissue swelling.  No acute bony abnormalities demonstrated.  Degenerative changes.  Benign-appearing bone sclerosis in the distal phalanx of the fourth finger.  Original Report Authenticated By: Marlon Pel, M.D.    Medications: Scheduled Meds:    . ampicillin-sulbactam (UNASYN) IV  3 g Intravenous Q6H  . colchicine  0.6 mg Oral BID  . enoxaparin (LOVENOX) injection  40 mg Subcutaneous Q24H  . metoprolol tartrate  25 mg Oral BID  . mupirocin ointment   Topical BID  . nystatin  10 mL Oral QID  . nystatin   Topical BID  . sodium chloride  3 mL Intravenous Q12H   Continuous Infusions:    . sodium chloride 50 mL/hr at 05/05/12 1122     Assessment/Plan:   1. Bacteremia due to Group A Streptococcus/ Sepsis, left wrist septic arthritis : Status post I&D, left wrist arthrotomy and drainage on 04/24/2012 and 04/25/2012 -  continue Unasyn. Appreciate orthopedics for followup on the wrist  2. Multiple septic foci/abscesses and cellulitis:  - Status post I and D's, ENT closely following, wound healing satisfactorily, closer to DC from ENT standpoint cont Unasyn   3. Acute hypoxic respiratory failure and Pleural effusion, bilateral with compressive atelectasis: Transient during hospitalization felt to be multifactorial, secondary to opioids, pleural  effusions and compressive atelectasis.   4. Thrush: Continue Nystatin swish and swallow.   5. Anemia:  Patient had a mild microcytic anemia, anemia panel revealed iron deficiency., May need outpatient GI workup   6. Gout: Patient has a history of gout, and on 4/30, had right 1st MTP joint swelling, redness and pain.  -Received 5-day course of oral Prednisone -completed on 5/5, pain and swelling improved while on prednisone but recurred after completing the prednisone  -Improved on colchicine(started on 5/6), continue to monitor closely   7. Hypertension: With multiple Elevated BP readings   - Continue Lopressor    DVT Prophylaxis: Lovenox  Code Status: Full code  Disposition: Will discuss with hand surgery/orthopedics tomorrow regarding the wrist wound and how often patient is going to need the dressings. ? If he can benefit from SNF for IV antibiotics and dressing changes versus home health.   LOS: 23  days   Josedaniel Haye M.D. [facility] 05/05/2012, 1:21 PM Pager: [phone]

## 2012-05-06 DIAGNOSIS — L02519 Cutaneous abscess of unspecified hand: Secondary | ICD-10-CM

## 2012-05-06 DIAGNOSIS — L03119 Cellulitis of unspecified part of limb: Secondary | ICD-10-CM

## 2012-05-06 DIAGNOSIS — J9851 Mediastinitis: Secondary | ICD-10-CM

## 2012-05-06 DIAGNOSIS — R7881 Bacteremia: Secondary | ICD-10-CM

## 2012-05-06 DIAGNOSIS — A409 Streptococcal sepsis, unspecified: Secondary | ICD-10-CM

## 2012-05-06 NOTE — Progress Notes (Signed)
Nutrition Follow-up  Diet Order:  Regular  Pt reports he feels he is eating well and getting enough to eat, however also reports that if he does not like or want a food he will not eat it.   Based on food frequency assessment, pt diet is likely suboptimal in protein. Pt has also associated some of his favorite foods with glucose checks that were discontinued at the end of April. He has been avoiding these foods to prevent glucose checks. Explained the goals for monitoring glucose and reviewed orders with pt- no glucose monitoring ordered at this time.  Encourage pt to add these foods back to diet  RD has visited with pt on a weekly basis and always finds pt lying in bed.  Pt reports daily ambulation and activity.  Encouraged activity to improve appetite.  Meds: Scheduled Meds:   . ampicillin-sulbactam (UNASYN) IV  3 g Intravenous Q6H  . colchicine  0.6 mg Oral BID  . enoxaparin (LOVENOX) injection  40 mg Subcutaneous Q24H  . metoprolol tartrate  25 mg Oral BID  . mupirocin ointment   Topical BID  . nystatin  10 mL Oral QID  . nystatin   Topical BID  . sodium chloride  3 mL Intravenous Q12H   Continuous Infusions:   . sodium chloride 50 mL/hr at 05/05/12 1122   PRN Meds:.acetaminophen, cloNIDine, morphine injection, ondansetron (ZOFRAN) IV, ondansetron, oxyCODONE, phenol, sodium chloride  Labs:  CMP     Component Value Date/Time   NA 138 05/05/2012 0615   K 3.7 05/05/2012 0615   CL 99 05/05/2012 0615   CO2 27 05/05/2012 0615   GLUCOSE 92 05/05/2012 0615   BUN 3* 05/05/2012 0615   CREATININE 0.67 05/05/2012 0615   CALCIUM 9.1 05/05/2012 0615   PROT 7.3 04/24/2012 0515   ALBUMIN 1.7* 04/24/2012 0515   AST 133* 04/24/2012 0515   ALT 85* 04/24/2012 0515   ALKPHOS 144* 04/24/2012 0515   BILITOT 0.7 04/24/2012 0515   GFRNONAA >90 05/05/2012 0615   GFRAA >90 05/05/2012 0615     Intake/Output Summary (Last 24 hours) at 05/06/12 1021 Last data filed at 05/06/12 0948  Gross per 24 hour  Intake    1980 ml  Output   3050 ml  Net  -1070 ml    Weight Status:  No new wt  Nutrition Dx:  Increased nutrient needs, ongoing.  Intervention:   1.  Meals/snacks; Pt needs a new copy of menu.  Discussed options with pts, particularly non-entree items.  Pt has been a pt for nearly a month and has cycled through menu several times.  Discussed some options to add variety and protein.  Discussed sources of protein in diet and encouraged pt to order extra protein with meals.    Monitor:   1.  Food/Beverage; PO intake >75% of meals and snacks.  Met, continue.  Hoyt Koch Pager #:  [phone]

## 2012-05-06 NOTE — Progress Notes (Signed)
Occupational Therapy Treatment Patient Details Name: [name] MRN: [id number] DOB: November 20, 1961 Today's Date: 05/06/2012 Time: 1410-1435 OT Time Calculation (min): 25 min  OT Assessment / Plan / Recommendation Comments on Treatment Session Spoke with Dr. Orlan Leavens  regarding using coban to wrap pt in composite flexion. Ortman gave vo to proceed as needed. Pt ace wrapped in composite flexion. nsg notified to call if pt became uncomfortable prior to return.                                     Precautions / Restrictions Restrictions LUE Weight Bearing: Weight bear through elbow only   Pertinent Vitals/Pain Reports pain is better        OT Goals    Visit Information  Last OT Received On: 05/06/12                          Exercises General Exercises - Upper Extremity Digit Composite Flexion: Left;10 reps;AROM Composite Extension: AROM;Left;10 reps;Seated      End of Session OT - End of Session Activity Tolerance: Patient limited by pain Patient left: in chair;with call bell/phone within reach Nurse Communication: Other (comment) (wrapped in composite flexion)   [name],[name] 05/06/2012, 5:18 PM [facility], OTR/L  [phone] 05/06/2012

## 2012-05-06 NOTE — Progress Notes (Signed)
Patient ID: [name], male   DOB: [birth year], 51 y.o.   MRN: [id number]  Subjective: No events overnight. Patient denies chest pain, shortness of breath, abdominal pain.  Objective:  Vital signs in last 24 hours:  Filed Vitals:   05/05/12 1405 05/05/12 2154 05/06/12 0523 05/06/12 1343  BP: 145/97 156/85 154/74   Pulse: 97 90 90 84  Temp: 97.8 F (36.6 C) 97.5 F (36.4 C) 98.6 F (37 C) 97.9 F (36.6 C)  TempSrc: Oral Oral Oral   Resp: 20 20 20 20   Height:      Weight:      SpO2: 96% 99% 100% 96%    Intake/Output from previous day:   Intake/Output Summary (Last 24 hours) at 05/06/12 1808 Last data filed at 05/06/12 1500  Gross per 24 hour  Intake   1730 ml  Output   1651 ml  Net     79 ml    Physical Exam: General: Alert, awake, oriented x3, in no acute distress. HEENT: No bruits, no goiter. Moist mucous membranes, no scleral icterus, no conjunctival pallor. Heart: Regular rate and rhythm, S1/S2 +, no murmurs, rubs, gallops. Lungs: Clear to auscultation bilaterally. No wheezing, no rhonchi, no rales.  Abdomen: Soft, nontender, nondistended, positive bowel sounds. Extremities: No clubbing or cyanosis, no pitting edema,  positive pedal pulses. Neuro: Grossly nonfocal.  Lab Results:   Lab 05/05/12 0615 04/30/12 0534  WBC 7.7 9.1  HGB 8.4* 8.4*  HCT 26.5* 26.3*  PLT 479* 432*  MCV 66.9* 67.3*  MCH 21.2* 21.5*  MCHC 31.7 31.9  RDW 15.2 15.6*  LYMPHSABS -- --  MONOABS -- --  EOSABS -- --  BASOSABS -- --  BANDABS -- --    Lab 05/05/12 0615 05/01/12 0805 04/30/12 0534  NA 138 135 136  K 3.7 4.0 3.4*  CL 99 98 99  CO2 27 28 27   GLUCOSE 92 100* 127*  BUN 3* 3* 5*  CREATININE 0.67 0.64 0.64  CALCIUM 9.1 9.2 8.8  MG -- -- --   Studies/Results: No results found.  Medications: Scheduled Meds:   . ampicillin-sulbactam (UNASYN) IV  3 g Intravenous Q6H  . colchicine  0.6 mg Oral BID  . enoxaparin (LOVENOX) injection  40 mg Subcutaneous Q24H  .  metoprolol tartrate  25 mg Oral BID  . mupirocin ointment   Topical BID  . nystatin  10 mL Oral QID  . nystatin   Topical BID  . sodium chloride  3 mL Intravenous Q12H   Continuous Infusions:   . sodium chloride 50 mL/hr at 05/05/12 1122   PRN Meds:.acetaminophen, cloNIDine, morphine injection, ondansetron (ZOFRAN) IV, ondansetron, oxyCODONE, phenol, sodium chloride  Assessment/Plan:  1. Bacteremia due to Group A Streptococcus/ Sepsis, left wrist septic arthritis : Status post I&D, left wrist arthrotomy and drainage on 04/24/2012 and 04/25/2012  - continue Unasyn. Appreciate orthopedics for followup on the wrist  2. Multiple septic foci/abscesses and cellulitis:  - Status post I and D's, ENT closely following, cont Unasyn  - pt ready for d/c from ENT stand point - will obtain ID consult for recommendation on antibiotic coverage and duration - appreciate the ID help 3. Acute hypoxic respiratory failure and Pleural effusion, bilateral with compressive atelectasis: Transient during hospitalization felt to be multifactorial, secondary to opioids, pleural effusions and compressive atelectasis. This is now stable and pt i smaintaining oxygen saturations > 93% on RA 4. Thrush: Continue Nystatin swish and swallow.  5. Anemia:  Patient had a  mild microcytic anemia, anemia panel revealed iron deficiency., May need outpatient GI workup. For now Hg/Hct remain stable 6. Gout: Patient has a history of gout, and on 4/30, had right 1st MTP joint swelling, redness and pain.  -Received 5-day course of oral Prednisone -completed on 5/5, pain and swelling improved while on prednisone but recurred after completing the prednisone  -Improved on colchicine (started on 5/6), continue to monitor closely  7. Hypertension: With multiple Elevated BP readings  - Continue Lopressor  DVT Prophylaxis: Lovenox  Code Status: Full code  Disposition: Most likely ready for d/c in AM, please note that pt does not want to  go to SNF but would rather have HH help for IV antiiotics   EDUCATION - test results and diagnostic studies were discussed with patient and pt's family who was present at the bedside - patient and family have verbalized the understanding - questions were answered at the bedside and contact information was provided for additional questions or concerns   LOS: 24 days   MAGICK-Oniya Mandarino 05/06/2012, 6:08 PM  [facility] Pager: [phone]

## 2012-05-06 NOTE — Progress Notes (Signed)
05/06/2012 12:42 PM  [name], [name] [id number]  Post-Op Day 12/19    Temp:  [97.5 F (36.4 C)-98.6 F (37 C)] 98.6 F (37 C) (05/13 0523) Pulse Rate:  [90-97] 90  (05/13 0523) Resp:  [20] 20  (05/13 0523) BP: (145-156)/(74-97) 154/74 mmHg (05/13 0523) SpO2:  [96 %-100 %] 100 % (05/13 0523),     Intake/Output Summary (Last 24 hours) at 05/06/12 1242 Last data filed at 05/06/12 0948  Gross per 24 hour  Intake   1980 ml  Output   2850 ml  Net   -870 ml    No results found for this or any previous visit (from the past 24 hour(s)).  SUBJECTIVE:  More spirited.  Less pain.  OBJECTIVE:  Neck sealing well.  No adjacent swelling or tenderness.  IMPRESSION:  Satisfactory improvement.  PLAN:  Smaller dressing.  OK for home venue from my standpoint.  Flo Shanks

## 2012-05-06 NOTE — Progress Notes (Signed)
Occupational Therapy Treatment Patient Details Name: [name] MRN: [id number] DOB: 1961-02-24 Today's Date: 05/06/2012 Time: 4098-1191 OT Time Calculation (min): 18 min  OT Assessment / Plan / Recommendation Comments on Treatment Session Began session with about importance of ROM and mobility of L digits. Pt demonstrates what he has learned, which is wigggling fingers". Educated importance of increasing MP and IP ROM through deomstration. Began gentle stretch and pt stated he was too painful and had not received pain meds in days. Discussed with nsg. Pt had been given pain meds @ 3.5 hours ago. Nsg giving pt IV pain meds, then pain med po in @ 1 hour. Heat applied to digits. Will return in @ 1 hour to complete session. Written information given to reinforce education. Theraputty also given. Wife present initially, then left.    Follow Up Recommendations  Home health OT           Equipment Recommendations  3 in 1 bedside comode        Frequency Min 3X/week   Plan Discharge plan remains appropriate    Precautions / Restrictions Restrictions Weight Bearing Restrictions: Yes LUE Weight Bearing: Weight bear through elbow only   Pertinent Vitals/Pain Pain - 1o.see doc flow    OT Goals Acute Rehab OT Goals OT Goal Formulation: With patient  Visit Information  Last OT Received On: 05/06/12    Subjective Data   " i got to have something for pain"                  Exercises  gentle stretching L digits. Will complete in next session after pain controlled      End of Session OT - End of Session Activity Tolerance: Patient limited by pain Patient left: in chair;with call bell/phone within reach Nurse Communication: Patient requests pain meds   Manali Mcelmurry,HILLARY 05/06/2012, 1:06 PM [facility], OTR/L  [phone] 05/06/2012

## 2012-05-06 NOTE — Progress Notes (Addendum)
Occupational Therapy Treatment Patient Details Name: SUNDEEP DESTIN MRN: [id number] DOB: [birth year] Today's Date: 05/06/2012 Time: 6578-4696 OT Time Calculation (min): 25 min  OT Assessment / Plan / Recommendation Comments on Treatment Session Gentle stretch and joint mobilization followed by A/AA/PROM of all digits. Able to achieve greater ROM after last session today. Pt has written exercises to follow. Encouraged pt to complete exercise program as much as he can tolerate.    Follow Up Recommendations  Home health OT          Equipment Recommendations  3 in 1 bedside comode        Frequency Min 3X/week   Plan Discharge plan remains appropriate    Precautions / Restrictions Restrictions LUE Weight Bearing: Weight bear through elbow only   OT Goals Acute Rehab OT Goals OT Goal Formulation: With patient Time For Goal Achievement: 05/17/12 Potential to Achieve Goals: Good ADL Goals Pt Will Perform Eating: with modified independence;Sitting, chair;Sitting, edge of bed;Unsupported ADL Goal: Eating - Progress: Progressing toward goals Pt Will Perform Grooming: with modified independence;Unsupported;Standing at sink ADL Goal: Grooming - Progress: Progressing toward goals Pt Will Perform Upper Body Bathing: with modified independence;Unsupported ADL Goal: Upper Body Bathing - Progress: Progressing toward goals Pt Will Perform Lower Body Bathing: with modified independence;Unsupported ADL Goal: Lower Body Bathing - Progress: Progressing toward goals Pt Will Perform Upper Body Dressing: with modified independence;Supported;Sitting, chair;Sitting, bed ADL Goal: Upper Body Dressing - Progress: Progressing toward goals Pt Will Perform Lower Body Dressing: with modified independence;Sit to stand from chair;Sit to stand from bed;Unsupported ADL Goal: Lower Body Dressing - Progress: Progressing toward goals Pt Will Transfer to Toilet: with modified independence;Ambulation;with  DME;3-in-1 ADL Goal: Toilet Transfer - Progress: Progressing toward goals Pt Will Perform Toileting - Clothing Manipulation: with modified independence;Standing ADL Goal: Toileting - Clothing Manipulation - Progress: Progressing toward goals Pt Will Perform Toileting - Hygiene: Independently;Sit to stand from 3-in-1/toilet ADL Goal: Toileting - Hygiene - Progress: Progressing toward goals Arm Goals Pt Will Tolerate PROM: to maintain range of motion;without pain;to decrease contracture;Left upper extremity;5 reps Arm Goal: PROM - Progress: Progressing toward goal Additional Arm Goal #1: Pt will be Independent with LUE SROM shoulder, elbow, forearm, and digits; as well as keeping LUE elevated appropriately Arm Goal: Additional Goal #1 - Progress: Progressing toward goals  Visit Information  Last OT Received On: 05/06/12                          Exercises General Exercises - Upper Extremity Digit Composite Flexion: Left;10 reps;AROM Composite Extension: AROM;Left;10 reps;Seated  Balance    End of Session OT - End of Session Activity Tolerance: Patient limited by pain Patient left: in chair;with call bell/phone within reach Nurse Communication: Other (comment) (asked nursing to encourage ROM ex with pt)   Santita Hunsberger,HILLARY 05/06/2012, 5:30 PM [facility], OTR/L  [phone] 05/06/2012

## 2012-05-07 DIAGNOSIS — J9851 Mediastinitis: Secondary | ICD-10-CM

## 2012-05-07 DIAGNOSIS — R7881 Bacteremia: Secondary | ICD-10-CM

## 2012-05-07 DIAGNOSIS — L03119 Cellulitis of unspecified part of limb: Secondary | ICD-10-CM

## 2012-05-07 DIAGNOSIS — L02519 Cutaneous abscess of unspecified hand: Secondary | ICD-10-CM

## 2012-05-07 DIAGNOSIS — A409 Streptococcal sepsis, unspecified: Secondary | ICD-10-CM

## 2012-05-07 MED ORDER — PROMETHAZINE HCL 12.5 MG PO TABS: 12.5000 mg | ORAL_TABLET | Freq: Four times a day (QID) | ORAL | Status: DC | PRN

## 2012-05-07 MED ORDER — AMOXICILLIN 500 MG PO CAPS
750.0000 mg | ORAL_CAPSULE | Freq: Two times a day (BID) | ORAL | Status: DC
  Filled 2012-05-07: qty 1

## 2012-05-07 MED ORDER — METOPROLOL TARTRATE 25 MG PO TABS: 25.0000 mg | ORAL_TABLET | Freq: Two times a day (BID) | ORAL | Status: DC

## 2012-05-07 MED ORDER — OXYCODONE HCL 5 MG PO TABS: 5.0000 mg | ORAL_TABLET | Freq: Four times a day (QID) | ORAL | Status: AC | PRN

## 2012-05-07 MED ORDER — COLCHICINE 0.6 MG PO TABS: 0.6000 mg | ORAL_TABLET | Freq: Two times a day (BID) | ORAL | Status: DC

## 2012-05-07 MED ORDER — ONDANSETRON 4 MG PO TBDP: 4.0000 mg | ORAL_TABLET | Freq: Four times a day (QID) | ORAL | Status: AC | PRN

## 2012-05-07 MED ORDER — AMOXICILLIN 250 MG PO CAPS: 750.0000 mg | ORAL_CAPSULE | Freq: Two times a day (BID) | ORAL | Status: AC

## 2012-05-07 MED ORDER — AMOXICILLIN 250 MG PO CAPS: 750.0000 mg | ORAL_CAPSULE | Freq: Two times a day (BID) | ORAL | Status: DC

## 2012-05-07 NOTE — Consult Note (Signed)
Infectious Diseases Initial Consultation  Reason for Consultation:  Antibiotic duration   HPI: [name] is a 51 y.o. male with hsitory of lumbar surgery who initially presented to ED on 4/15 with symptoms of viral gastroenteritis.  Patient return on 4/20 with a swollen hand and noted to have a low BP and tachycardia.  Started then on vancomycin and avelox, then cefazolin to 4/23 and unasyn since 4/24 (day 24 antibiotics).  He was noted to have a right deep neck infection and taken to the OR 4/25 for debridement and noted to have pockets of pus that grew GAS.  Also, initial blood cultures were positive 4/20 with the same.  Patient also had an MRI of forearm and required surgical debridement for right arm concerning for abscess and septic arthritis.  On 4/25, he also had a psoas abscess drained, culture negative.  Did have a repeat I and D 5/1.  HIV negative.  Plan was for 28 days of IV antibioitcs.    History reviewed. No pertinent past medical history.  Allergies: No Known Allergies  Current antibiotics:   MEDICATIONS:    . ampicillin-sulbactam (UNASYN) IV  3 g Intravenous Q6H  . colchicine  0.6 mg Oral BID  . enoxaparin (LOVENOX) injection  40 mg Subcutaneous Q24H  . metoprolol tartrate  25 mg Oral BID  . mupirocin ointment   Topical BID  . nystatin  10 mL Oral QID  . nystatin   Topical BID  . sodium chloride  3 mL Intravenous Q12H    History  Substance Use Topics  . Smoking status: Never Smoker   . Smokeless tobacco: Never Used  . Alcohol Use: 1.5 oz/week    3 drink(s) per week    No family history on file.  Review of Systems - Negative except as per HPI  OBJECTIVE: Temp:  [97.9 F (36.6 C)-98.7 F (37.1 C)] 98.6 F (37 C) (05/14 0547) Pulse Rate:  [84-95] 85  (05/14 0547) Resp:  [18-20] 18  (05/14 0547) BP: (156-160)/(77-79) 160/79 mmHg (05/14 0547) SpO2:  [94 %-99 %] 99 % (05/14 0547) General appearance: alert, cooperative and no distress Resp: clear to  auscultation bilaterally Cardio: regular rate and rhythm, S1, S2 normal, no murmur, click, rub or gallop Skin: Skin color, texture, turgor normal. No rashes or lesions  LABS: No results found for this or any previous visit (from the past 48 hour(s)).  MICRO:  IMAGING: No results found.  HISTORICAL MICRO/IMAGING  Assessment/Plan:    1) Disseminated GAS infection - I think that at this point, he has responded well to therapy and if he is to be discharged, he can go out on 10 days of amoxicillin 750 mg bid and no further need for IV at discharge since he will have had 24 or 25 days.  He should follow up with ortho per their recs.   Call with questions

## 2012-05-07 NOTE — Progress Notes (Signed)
Occupational Therapy Treatment Patient Details Name: [name] MRN: [id number] DOB: 1961/10/20 Today's Date: 05/07/2012 Time: 4098-1191 OT Time Calculation (min): 30 min  OT Assessment / Plan / Recommendation Comments on Treatment Session Focus of session onfabrication of MP composite flexion brace. Adapted brace to encourage continued passive stretch on all digits. Little and ring fingers with greater ROM. index and middle with @ 25 degree PROm. AFter stretch, able tot achieve 45 degrees PROM. Pt tolerating spint well. Gave pt written instructions on wearing time of splint. pt able to verbaize how to donn/doff splint. Splint labeled to increase indep with splint. Given written instructions on purpose and wearing time of splint. CNA present. Pt has all written HEP, including tendon gliding ex. Pt needs to continue with HHOT. Pt with improved IP AROM today. Instructed pt to use hand functinally.    Follow Up Recommendations  Home health OT    Barriers to Discharge       Equipment Recommendations  3 in 1 bedside comode    Recommendations for Other Services    Frequency Min 3X/week   Plan Discharge plan remains appropriate    Precautions / Restrictions Precautions Required Braces or Orthoses: Other Brace/Splint (L composite finger flex brace) Other Brace/Splint:  (to be worn in 30 - 60 min increments, until he can tolerate ) Restrictions Weight Bearing Restrictions: Yes LUE Weight Bearing: Weight bear through elbow only   Pertinent Vitals/Pain 4    ADL  Eating/Feeding: Simulated;Modified independent Where Assessed - Eating/Feeding: Chair Grooming: Simulated;Modified independent Where Assessed - Grooming: Unsupported sitting Upper Body Bathing: Simulated;Set up Lower Body Bathing: Simulated;Set up Upper Body Dressing: Simulated;Set up Lower Body Dressing: Simulated;Set up Toilet Transfer: Simulated;Modified independent Toilet Transfer Method: Network engineer: Regular height toilet Toileting - Clothing Manipulation: Simulated;Modified independent Where Assessed - Toileting Clothing Manipulation: Standing Toileting - Hygiene: Simulated;Independent Where Assessed - Toileting Hygiene: Standing Tub/Shower Transfer: Not assessed Ambulation Related to ADLs: mod I ADL Comments: Improved ability tocomplete ADLwith increaed ROM     OT Goals Acute Rehab OT Goals OT Goal Formulation: With patient Time For Goal Achievement: 05/17/12 Potential to Achieve Goals: Good ADL Goals Pt Will Perform Eating: with modified independence;Sitting, chair;Sitting, edge of bed;Unsupported ADL Goal: Eating - Progress: Met Pt Will Perform Grooming: with modified independence;Unsupported;Standing at sink ADL Goal: Grooming - Progress: Met Pt Will Perform Upper Body Bathing: with modified independence;Unsupported ADL Goal: Upper Body Bathing - Progress: Met Pt Will Perform Lower Body Bathing: with modified independence;Unsupported ADL Goal: Lower Body Bathing - Progress: Met Pt Will Perform Upper Body Dressing: with modified independence;Supported;Sitting, chair;Sitting, bed ADL Goal: Upper Body Dressing - Progress: Met ADL Goal: Lower Body Dressing - Progress: Met Pt Will Transfer to Toilet: with modified independence;Ambulation;with DME;3-in-1 ADL Goal: Toilet Transfer - Progress: Met Pt Will Perform Toileting - Clothing Manipulation: with modified independence;Standing ADL Goal: Toileting - Clothing Manipulation - Progress: Met Pt Will Perform Toileting - Hygiene: Independently;Sit to stand from 3-in-1/toilet ADL Goal: Toileting - Hygiene - Progress: Met Arm Goals Pt Will Tolerate PROM: to maintain range of motion;without pain;to decrease contracture;Left upper extremity;5 reps Arm Goal: PROM - Progress: Progressing toward goal Additional Arm Goal #1: Pt will be Independent with LUE SROM shoulder, elbow, forearm, and digits; as well as keeping LUE  elevated appropriately Arm Goal: Additional Goal #1 - Progress: Progressing toward goals Additional Arm Goal #2: Pt will tolerate composite flexion splint x 1 hour 3x/day to increase PF flexion and functional use of  L hand.  Arm Goal: Additional Goal #2 - Progress: Goal set today  Visit Information  Last OT Received On: 05/07/12    Subjective Data   im going home tonight         Cognition  Overall Cognitive Status: Appears within functional limits for tasks assessed/performed    Mobility Bed Mobility Bed Mobility: Supine to Sit Supine to Sit: 6: Modified independent (Device/Increase time)   Exercises General Exercises - Upper Extremity Digit Composite Flexion: Left;10 reps;Seated;Squeeze ball;Hand exerciser Composite Flexion Limitations: limited in MP ROM Composite Extension: AROM;Left;10 reps;Seated Hand Exercises Digit Composite Abduction: AROM;10 reps Digit Composite Adduction: AROM;Left;10 reps Digit Lifts: Left;10 reps;Seated Thumb Abduction: AAROM;10 reps;Left;Seated Thumb Adduction: AAROM;Left;10 reps;Seated      End of Session  In bed with call bell within reach   [facility] 05/07/2012, 5:21 PM [facility], OTR/L  [phone] 05/07/2012

## 2012-05-07 NOTE — Discharge Summary (Signed)
Physician Discharge Summary  Patient ID: [name] MRN: [id number] DOB/AGE: 1961-08-01 51 y.o.  Admit date: 04/12/2012 Discharge date: 05/07/2012  Primary Care Physician:  Sheila Oats, MD, MD  Discharge Diagnoses:    .Cellulitis and abscess of hand/septic arthritis - LEFT .Failed back syndrome, lumbar .Sepsis associated hypotension .Bacteremia due to Group A Streptococcus .Neck abscess with extension into right chest wall and mediastinum .Psoas abscess, left .Transaminitis- ? SHOCK LIVER .SIRS (systemic inflammatory response syndrome) .Pleural effusion, bilateral with compressive atelectasis .Anemia- ? hemodilution .Acute respiratory failure with hypoxia .Right ankle swelling with pain and erythema  Consults:  ENT, Dr. Lazarus Salines                   Orthopedics, Dr. Melvyn Novas                   ID : Dr Luciana Axe   Discharge Medications: Medication List  As of 05/07/2012  2:40 PM   STOP taking these medications         oxyCODONE-acetaminophen 5-325 MG per tablet         TAKE these medications         acetaminophen 325 MG tablet   Commonly known as: TYLENOL   Take 650 mg by mouth every 6 (six) hours as needed. pain      amoxicillin 250 MG capsule   Commonly known as: AMOXIL   Take 3 capsules (750 mg total) by mouth every 12 (twelve) hours. 10 days      colchicine 0.6 MG tablet   Take 1 tablet (0.6 mg total) by mouth 2 (two) times daily.      GOODYS BODY PAIN PO   Take 1 Package by mouth daily as needed. For pain      metoprolol tartrate 25 MG tablet   Commonly known as: LOPRESSOR   Take 1 tablet (25 mg total) by mouth 2 (two) times daily.      naproxen sodium 220 MG tablet   Commonly known as: ANAPROX   Take 440 mg by mouth daily as needed. For pain      ondansetron 4 MG disintegrating tablet   Commonly known as: ZOFRAN-ODT   Take 1 tablet (4 mg total) by mouth every 6 (six) hours as needed for nausea.      oxyCODONE 5 MG immediate release tablet   Commonly  known as: Oxy IR/ROXICODONE   Take 1-3 tablets (5-15 mg total) by mouth every 6 (six) hours as needed for pain.      promethazine 12.5 MG tablet   Commonly known as: PHENERGAN   Take 1 tablet (12.5 mg total) by mouth every 6 (six) hours as needed for nausea.             Brief H and P: For complete details please refer to admission H and P, but in brief history 51-year-old African American male who had presented with left upper extremity swelling and pain. Per admission history, patient had a stomach flu/viral diarrhea and was given pain medication and nausea medications. Subsequently patient started having a swollen hand and presented to the ED. Patient was febrile, tachycardiac and mildly hypotensive at the time of admission with cellulitis of the left arm.  Hospital Course:  The patient is a 51 year old black male who presented with a swollen left hand and arm, as well as generalized pains. It was. It was noted that he had had upper respiratory infection on April 13 for which he sought medical care  but did not receive antibiotics and it is presumed that he was treated for a viral upper respiratory infection. He failed to improve, subsequently developed more constitutional symptoms and presented to our hospital with current condition. Septic work up revealed Group A streptococcal bacteremia, with multiple metastatic infective foci. ID consultation was provided by Dr Paulette Blanch Dam initially and ID has continued to follow and provide antibiotic recommendations. He is currently on antibiotic day 17 (currently on Unasyn) but per Dr Daiva Eves was to be transitioned to Rocephin monotherapy on discharge. Duration is unclear at this time. 2D Echocardiogram of 04/18/12, showed normal left ventricular cavity size, mild concentric hypertrophy and ejection fraction of 60% to 65%, with no regional wall motion abnormalities. A small to moderate pericardial effusion was identified circumferential to the heart and  no vegetations were identified. Dr Dietrich Pates, [place] cardiologist, reviewed the echocardiogram and in her opinion the effusion is actually small and no tamponade physiology is seen.  The multiple foci of infection, included neck abscess with extension into right chest wall/Sterno-clavicular joint and mediastinum, cellulitis and abscess of Left hand/Left Psoas abscess/ Right ankle and thigh swelling with pain and erythema- ? Evolving abscess. ENT consultation was provided by Dr Flo Shanks, who performed darinage of neck/mediastinal abscesses, Dr Bradly Bienenstock carried out debridement of LUE and aspiration of right ankle on 04/18/12. Aspiration of left iliopsoas abscess was performed by Dr Oley Balm, interventional radiologist, on the same date. Patient had a transaminitis, likely due to sepsis, but this improved with treatment. She cc was consulted and they have followed patient and ENT had a repeat neck CT on 5/1 revealed loculations in low RIGHT neck, intramuscular RIGHT SCM, sub pectoral accumulations and he was taken back to or for I&D. And subsequently then taken back to OR on 5/4 per ENT and Penrose drains were placed. His dressing changes have been daily per ENT.  1. Bacteremia due to Group A Streptococcus/ Sepsis, left wrist septic arthritis : Status post I&D, left wrist arthrotomy and drainage on 04/24/2012 and 04/25/2012. Patient will complete Unasyn IV today, ID was consulted and patient was started on amoxicillin for another 10 days. Patient was followed closely by orthopedics, Dr. Melvyn Novas and recommended follow up in office.   2. Multiple septic foci/abscesses and cellulitis: The patient had 2-D echo done on 04/18/2012 which was negative for any vegetations or endocarditis.  Status post I and D's, ENT Dr Lazarus Salines closely followed the patient, wound has been healing satisfactorily  3. Acute hypoxic respiratory failure and Pleural effusion, bilateral with compressive atelectasis: Transient  during hospitalization felt to be multifactorial, secondary to opioids, pleural effusions and compressive atelectasis, stable at DC.   4. Anemia:  Patient had a mild microcytic anemia, anemia panel revealed iron deficiency., May need outpatient GI workup   5 Gout: Patient has a history of gout, and on 4/30, had right 1st MTP joint swelling, redness and pain.  -Received 5-day course of oral Prednisone -completed on 5/5, pain and swelling improved while on prednisone but recurred after completing the prednisone. Improved on colchicine(started on 5/6), now stable   7. Hypertension: With multiple Elevated BP readings  - Continue Lopressor   Day of Discharge BP 144/85  Pulse 93  Temp(Src) 98.5 F (36.9 C) (Oral)  Resp 18  Ht 5\' 4"  (1.626 m)  Wt 61 kg (134 lb 7.7 oz)  BMI 23.08 kg/m2  SpO2 99%  Physical Exam: General: Alert and awake oriented x3 not in any acute distress.  HEENT: anicteric sclera, pupils reactive to light and accommodation CVS: S1-S2 clear no murmur rubs or gallops Chest: clear to auscultation bilaterally, no wheezing rales or rhonchi Abdomen: soft nontender, nondistended, normal bowel sounds, no organomegaly Extremities: no cyanosis, clubbing or edema noted bilaterally Neuro: Cranial nerves II-XII intact, no focal neurological deficits   The results of significant diagnostics from this hospitalization (including imaging, microbiology, ancillary and laboratory) are listed below for reference.    LAB RESULTS: Basic Metabolic Panel:  Lab 05/05/12 4098 05/01/12 0805  NA 138 135  K 3.7 4.0  CL 99 98  CO2 27 28  GLUCOSE 92 100*  BUN 3* 3*  CREATININE 0.67 0.64  CALCIUM 9.1 9.2  MG -- --  PHOS -- --  CBC:  Lab 05/05/12 0615  WBC 7.7  NEUTROABS --  HGB 8.4*  HCT 26.5*  MCV 66.9*  PLT 479*    Significant Diagnostic Studies:  Ct Hand Left W Contrast  04/13/2012  *RADIOLOGY REPORT*  Clinical Data: Pain, swelling, and erythema of the left forearm and hand.   CT OF THE LEFT HAND WITH CONTRAST  Technique:  Multidetector CT imaging was performed following the standard protocol during bolus administration of intravenous contrast.  Contrast: OMNIPAQUE IOHEXOL 300 MG/ML  SOLN  Comparison: Plain radiographs 04/12/2012  Findings: Diffuse low attenuation change throughout the intramuscular planes and subcutaneous soft tissues of the distal forearm and wrist.  Not loculated appearing fluid collection deep to the distal flexor or muscle and tendon compartment.  Changes are likely represent cellulitis, likely with tenosynovitis and edema. No discrete abscess cavity is demonstrated although focal abscess may be obscured.  MRI is suggested for more complete evaluation. Visualized bones appear intact.  There is no obvious fracture or bone lesion.  No cortical erosion or periosteal reaction to suggest osteomyelitis.  Degenerative changes.  IMPRESSION: Diffuse edema in the soft tissues and intramuscular planes of the distal forearm and wrist.  Volar fluid collection at the distal flexor tendons and muscles likely represents diffuse edema.  No definite localized abscess is demonstrated.  Consider MRI for better evaluation of these changes.  Original Report Authenticated By: Marlon Pel, M.D.   Dg Hand Complete Left  04/12/2012  *RADIOLOGY REPORT*  Clinical Data: Left hand pain, swelling, and tenderness.  No known injury.  LEFT HAND - COMPLETE 3+ VIEW  Comparison: None.  Findings: Diffuse soft tissue swelling.  No radiopaque foreign bodies or gas collections in the subcutaneous tissues.  Bones appear intact without evidence of acute fracture or subluxation. Benign-appearing sclerosis in the distal phalanx of the fourth finger.  No destructive bone lesions or expansile lesions demonstrated.  Degenerative changes in the STT, first metacarpal phalangeal, and multiple interphalangeal joints.  No abnormal periosteal reaction.  Bone cortex and trabecular architecture appear  intact.  IMPRESSION: Diffuse soft tissue swelling.  No acute bony abnormalities demonstrated.  Degenerative changes.  Benign-appearing bone sclerosis in the distal phalanx of the fourth finger.  Original Report Authenticated By: Marlon Pel, M.D.     Disposition and Follow-up: Discharge Orders    Future Orders Please Complete By Expires   Diet - low sodium heart healthy      Increase activity slowly      Discharge wound care:      Comments:   Per DC instructions   Discharge instructions      Comments:   NECK: Apply small amount Bacitracin ointment to wound and change dressing once daily. Dressing changes daily  DISPOSITION: Home with home health PT, OT, RN  DIET: heart healthy  ACTIVITY: As tolerated  DISCHARGE FOLLOW-UP Follow-up Information    Follow up with Sharma Covert, MD. Schedule an appointment as soon as possible for a visit in 10 days.   Contact information:   [facility] [address] [place] [address] [phone]       Follow up with Flo Shanks, MD. Schedule an appointment as soon as possible for a visit in 10 days. (for hospital follow-up)    Contact information:   [facility] [address], [address] [place] [address] [phone]          Time spent on Discharge: 45 minutes  Signed:  Jaquilla Woodroof M.D. [facility] 05/07/2012, 2:40 PM

## 2012-05-07 NOTE — Discharge Instructions (Signed)
KEEP BANDAGE CLEAN AND DRY CALL OFFICE FOR F/U APPT [phone] in 7-10 days after discharge KEEP HAND ELEVATED ABOVE HEART OK TO APPLY ICE TO OPERATIVE AREA CONTACT OFFICE IF ANY WORSENING PAIN OR CONCERNS.   Home Health to be provided by [facility] [phone]

## 2012-05-07 NOTE — Progress Notes (Signed)
Occupational Therapy Treatment Patient Details Name: [name] MRN: [id number] DOB: 1961-11-08 Today's Date: 05/07/2012 Time: 4098-1191 OT Time Calculation (min): 30 min  OT Assessment / Plan / Recommendation Comments on Treatment Session Heat x 15 min followed by gentle passive stretch. Focus on teaching pt ROM. Again, having to reinforce proper technique to increase MP flexion. Pt states he will be able to do it better o nhis own once he is home.     Follow Up Recommendations  Home health OT    Barriers to Discharge   none    Equipment Recommendations  3 in 1 bedside comode        Frequency Min 3X/week   Plan Discharge plan remains appropriate    Precautions / Restrictions Restrictions Weight Bearing Restrictions: Yes LUE Weight Bearing: Weight bear through elbow only   Pertinent Vitals/Pain 4    ADL  Eating/Feeding: Simulated;Modified independent Where Assessed - Eating/Feeding: Chair Grooming: Simulated;Modified independent Where Assessed - Grooming: Unsupported sitting Upper Body Bathing: Simulated;Set up Lower Body Bathing: Simulated;Set up Upper Body Dressing: Simulated;Set up Lower Body Dressing: Simulated;Set up Toilet Transfer: Simulated;Modified independent Toilet Transfer Method: Proofreader: Regular height toilet Toileting - Clothing Manipulation: Simulated;Modified independent Where Assessed - Toileting Clothing Manipulation: Standing Toileting - Hygiene: Simulated;Independent Where Assessed - Toileting Hygiene: Standing Tub/Shower Transfer: Not assessed Ambulation Related to ADLs: mod I ADL Comments: Improved ability tocomplete ADLwith increaed ROM     OT Goals Acute Rehab OT Goals OT Goal Formulation: With patient Time For Goal Achievement: 05/17/12 Potential to Achieve Goals: Good ADL Goals Pt Will Perform Eating: with modified independence;Sitting, chair;Sitting, edge of bed;Unsupported ADL Goal: Eating -  Progress: Met Pt Will Perform Grooming: with modified independence;Unsupported;Standing at sink ADL Goal: Grooming - Progress: Met Pt Will Perform Upper Body Bathing: with modified independence;Unsupported ADL Goal: Upper Body Bathing - Progress: Met Pt Will Perform Lower Body Bathing: with modified independence;Unsupported ADL Goal: Lower Body Bathing - Progress: Met Pt Will Perform Upper Body Dressing: with modified independence;Supported;Sitting, chair;Sitting, bed ADL Goal: Upper Body Dressing - Progress: Met ADL Goal: Lower Body Dressing - Progress: Met Pt Will Transfer to Toilet: with modified independence;Ambulation;with DME;3-in-1 ADL Goal: Toilet Transfer - Progress: Met Pt Will Perform Toileting - Clothing Manipulation: with modified independence;Standing ADL Goal: Toileting - Clothing Manipulation - Progress: Met Pt Will Perform Toileting - Hygiene: Independently;Sit to stand from 3-in-1/toilet ADL Goal: Toileting - Hygiene - Progress: Met Arm Goals Pt Will Tolerate PROM: to maintain range of motion;without pain;to decrease contracture;Left upper extremity;5 reps Arm Goal: PROM - Progress: Progressing toward goal Additional Arm Goal #1: Pt will be Independent with LUE SROM shoulder, elbow, forearm, and digits; as well as keeping LUE elevated appropriately Arm Goal: Additional Goal #1 - Progress: Progressing toward goals Additional Arm Goal #2: Pt will tolerate composite flexion splint x 1 hour 3x/day to increase PF flexion and functional use of L hand.  Arm Goal: Additional Goal #2 - Progress: Goal set today  Visit Information  Last OT Received On: 05/07/12    Subjective Data   My fingers are moving better today.          Cognition  Overall Cognitive Status: Appears within functional limits for tasks assessed/performed    Mobility Bed Mobility Bed Mobility: Supine to Sit Supine to Sit: 6: Modified independent (Device/Increase time)   Exercises General Exercises -  Upper Extremity Digit Composite Flexion: Left;10 reps;Seated;Squeeze ball;Hand exerciser Composite Flexion Limitations: limited in MP ROM Composite Extension:  AROM;Left;10 reps;Seated Hand Exercises Digit Composite Abduction: AROM;10 reps Digit Composite Adduction: AROM;Left;10 reps Digit Lifts: Left;10 reps;Seated Thumb Abduction: AAROM;10 reps;Left;Seated Thumb Adduction: AAROM;Left;10 reps;Seated  Balance  WFL  End of Session  In bed. Call bell within reach.   Amjad Fikes,HILLARY 05/07/2012, 5:11 PM [facility], OTR/L  [phone] 05/07/2012

## 2012-09-15 ENCOUNTER — Encounter (HOSPITAL_COMMUNITY): Payer: Self-pay | Admitting: *Deleted

## 2012-09-15 ENCOUNTER — Emergency Department (HOSPITAL_COMMUNITY): Payer: Medicaid Other

## 2012-09-15 ENCOUNTER — Inpatient Hospital Stay (HOSPITAL_COMMUNITY)
Admission: EM | Admit: 2012-09-15 | Discharge: 2012-09-21 | DRG: 470 | Disposition: A | Discharge status: Home Health | Payer: Medicaid Other | Attending: Family Medicine | Admitting: Family Medicine

## 2012-09-15 DIAGNOSIS — M25539 Pain in unspecified wrist: Secondary | ICD-10-CM | POA: Diagnosis present

## 2012-09-15 DIAGNOSIS — M25552 Pain in left hip: Secondary | ICD-10-CM

## 2012-09-15 DIAGNOSIS — M1610 Unilateral primary osteoarthritis, unspecified hip: Principal | ICD-10-CM | POA: Diagnosis present

## 2012-09-15 DIAGNOSIS — D62 Acute posthemorrhagic anemia: Secondary | ICD-10-CM | POA: Diagnosis not present

## 2012-09-15 DIAGNOSIS — M129 Arthropathy, unspecified: Secondary | ICD-10-CM

## 2012-09-15 DIAGNOSIS — M8560 Other cyst of bone, unspecified site: Secondary | ICD-10-CM | POA: Diagnosis present

## 2012-09-15 DIAGNOSIS — I10 Essential (primary) hypertension: Secondary | ICD-10-CM | POA: Diagnosis present

## 2012-09-15 DIAGNOSIS — M169 Osteoarthritis of hip, unspecified: Principal | ICD-10-CM | POA: Diagnosis present

## 2012-09-15 HISTORY — DX: Essential (primary) hypertension: I10

## 2012-09-15 LAB — CBC
HCT: 42.7 % (ref 39.0–52.0)
Hemoglobin: 14.4 g/dL (ref 13.0–17.0)
MCH: 22.4 pg — ABNORMAL LOW (ref 26.0–34.0)
MCHC: 33.7 g/dL (ref 30.0–36.0)
MCV: 66.4 fL — ABNORMAL LOW (ref 78.0–100.0)
Platelets: 217 K/uL (ref 150–400)
RBC: 6.43 MIL/uL — ABNORMAL HIGH (ref 4.22–5.81)
RDW: 20.5 % — ABNORMAL HIGH (ref 11.5–15.5)
WBC: 5.4 K/uL (ref 4.0–10.5)

## 2012-09-15 LAB — COMPREHENSIVE METABOLIC PANEL
CO2: 25 mEq/L (ref 19–32)
Calcium: 10 mg/dL (ref 8.4–10.5)
Creatinine, Ser: 0.87 mg/dL (ref 0.50–1.35)
GFR calc Af Amer: >90 mL/min (ref >90)
GFR calc non Af Amer: >90 mL/min (ref >90)
Glucose, Bld: 86 mg/dL (ref 70–99)
Sodium: 138 mEq/L (ref 135–145)
Total Protein: 8.9 g/dL — ABNORMAL HIGH (ref 6.0–8.3)

## 2012-09-15 LAB — CBC WITH DIFFERENTIAL/PLATELET
Basophils Absolute: 0.1 10*3/uL (ref 0.0–0.1)
Basophils Relative: 1 % (ref 0–1)
Eosinophils Absolute: 0.5 10*3/uL (ref 0.0–0.7)
HCT: 46.9 % (ref 39.0–52.0)
Hemoglobin: 15.8 g/dL (ref 13.0–17.0)
MCH: 22.3 pg — ABNORMAL LOW (ref 26.0–34.0)
MCHC: 33.7 g/dL (ref 30.0–36.0)
Monocytes Absolute: 0.3 10*3/uL (ref 0.1–1.0)
Monocytes Relative: 6 % (ref 3–12)
Neutro Abs: 2.3 10*3/uL (ref 1.7–7.7)
Neutrophils Relative %: 45 % (ref 43–77)
RDW: 20.7 % — ABNORMAL HIGH (ref 11.5–15.5)

## 2012-09-15 LAB — CREATININE, SERUM
Creatinine, Ser: 0.88 mg/dL (ref 0.50–1.35)
GFR calc Af Amer: >90 mL/min (ref >90)
GFR calc non Af Amer: >90 mL/min (ref >90)

## 2012-09-15 MED ORDER — MORPHINE SULFATE 4 MG/ML IJ SOLN
4.0000 mg | Freq: Once | INTRAMUSCULAR | Status: AC
  Administered 2012-09-15: 4 mg via INTRAVENOUS
  Filled 2012-09-15: qty 1

## 2012-09-15 MED ORDER — SODIUM CHLORIDE 0.9 % IV SOLN: 250.0000 mL | INTRAVENOUS | Status: DC | PRN

## 2012-09-15 MED ORDER — SODIUM CHLORIDE 0.9 % IJ SOLN
3.0000 mL | Freq: Two times a day (BID) | INTRAMUSCULAR | Status: DC
  Administered 2012-09-17 (×2): 3 mL via INTRAVENOUS

## 2012-09-15 MED ORDER — SENNA 8.6 MG PO TABS
1.0000 | ORAL_TABLET | Freq: Two times a day (BID) | ORAL | Status: DC
  Administered 2012-09-15 – 2012-09-21 (×8): 8.6 mg via ORAL
  Filled 2012-09-15 (×13): qty 1

## 2012-09-15 MED ORDER — MORPHINE SULFATE 4 MG/ML IJ SOLN: 4.0000 mg | INTRAMUSCULAR | Status: DC | PRN

## 2012-09-15 MED ORDER — ENOXAPARIN SODIUM 40 MG/0.4ML ~~LOC~~ SOLN
40.0000 mg | SUBCUTANEOUS | Status: DC
  Administered 2012-09-15 – 2012-09-17 (×3): 40 mg via SUBCUTANEOUS
  Filled 2012-09-15 (×4): qty 0.4

## 2012-09-15 MED ORDER — PIPERACILLIN-TAZOBACTAM 3.375 G IVPB 30 MIN
3.3750 g | Freq: Three times a day (TID) | INTRAVENOUS | Status: DC
  Filled 2012-09-15: qty 50

## 2012-09-15 MED ORDER — SODIUM CHLORIDE 0.9 % IJ SOLN: 3.0000 mL | INTRAMUSCULAR | Status: DC | PRN

## 2012-09-15 MED ORDER — VANCOMYCIN HCL 1000 MG IV SOLR
750.0000 mg | Freq: Two times a day (BID) | INTRAVENOUS | Status: DC
  Administered 2012-09-15 – 2012-09-17 (×5): 750 mg via INTRAVENOUS
  Filled 2012-09-15 (×11): qty 750

## 2012-09-15 MED ORDER — SODIUM CHLORIDE 0.9 % IV SOLN
INTRAVENOUS | Status: AC
  Administered 2012-09-15: 125 mL/h via INTRAVENOUS

## 2012-09-15 MED ORDER — ACETAMINOPHEN 325 MG PO TABS: 650.0000 mg | ORAL_TABLET | Freq: Four times a day (QID) | ORAL | Status: DC | PRN

## 2012-09-15 MED ORDER — HYDROCODONE-ACETAMINOPHEN 5-325 MG PO TABS
1.0000 | ORAL_TABLET | ORAL | Status: DC | PRN
  Administered 2012-09-16 (×2): 2 via ORAL
  Filled 2012-09-15 (×2): qty 2

## 2012-09-15 MED ORDER — HYDROMORPHONE HCL PF 1 MG/ML IJ SOLN
1.0000 mg | INTRAMUSCULAR | Status: AC | PRN
  Administered 2012-09-15: 1 mg via INTRAVENOUS
  Filled 2012-09-15: qty 1

## 2012-09-15 MED ORDER — PIPERACILLIN-TAZOBACTAM 3.375 G IVPB
3.3750 g | Freq: Three times a day (TID) | INTRAVENOUS | Status: DC
  Administered 2012-09-15 – 2012-09-18 (×8): 3.375 g via INTRAVENOUS
  Filled 2012-09-15 (×11): qty 50

## 2012-09-15 MED ORDER — ACETAMINOPHEN 650 MG RE SUPP: 650.0000 mg | Freq: Four times a day (QID) | RECTAL | Status: DC | PRN

## 2012-09-15 MED ORDER — ONDANSETRON HCL 4 MG/2ML IJ SOLN
4.0000 mg | Freq: Three times a day (TID) | INTRAMUSCULAR | Status: AC | PRN
  Administered 2012-09-15: 4 mg via INTRAVENOUS
  Filled 2012-09-15: qty 2

## 2012-09-15 NOTE — H&P (Signed)
Family Medicine Teaching [facility] Admission History and Physical Service Pager: [phone]  Patient name: [name] Medical record number: [id number] Date of birth: 1961-01-07 Age: 51 y.o. Gender: male  Primary Care Provider: Sheila Oats, MD  Chief Complaint: L hip pain   Assessment and Plan: [name] is a 51 y.o. year old male with PMH of GAS bacteremia with multiple infective foci presenting with L hip and L wrist pain and subsequent difficulty walking for 1 week.   1. Left hip and wrist pain: Possibly septic or OA with unknown cause. In light of x ray's today he has a degenerative arthritis of the L hip. With his history of bacteremia and wide systemic involvement in May of this year we will cover with IV antibiotics while ruling out septic arthritis.  (Joint aspiration/culture obtained while in ED) Other DDx includes osteoarthritis, gout, RA, and lyme arthritis. Osteo is certainly a consideration with his x ray changes, but why at the age of 66 it has developed is unknown.  Inflammatory arthritis' like RA, lyme arthritis, and gout, are less likely because of normal ESR, characteristic symptoms, and lack of exposure. 1. Aspiration fluid studies pending 2. Empiric IV vanc and Zosyn 3. Pain control with PRN norco and IV morphine 4. Uric acid level in AM  2. HTN: No current antihypertensives but was treated during last admission 1. Will monitor and start meds as necessary  3. FEN/GI: Regular diet 4. Prophylaxis: Lovenox  5. Disposition: Observation, home pending studies and symptom improvement 6. Code Status: Full for now, will clarify in the AM.   History of Present Illness: [name] is a 51 y.o. year old male presenting with L hip pain and difficulty walking for 1 week.  Patient reports that he did have some hip pain at the time of his discharge but he says it was minimal.  About one week ago he noticed hip pain starting to come back.  It has become  progressively worse. Patient notes constant L hip pain exacerbated by use and relieved minimally by heat. States that it is keeping him from sleeping well at night and casuing him to have difficulty walking. Denies fevers, chills, sweats, and vomiting in the last week. States he has had some subjective fever, nausea, and diarrhea occasionally since his discharge from the hospital.  He has also had some L hand pain and decreased sensation but pain is minimal compared to L hip pain currently. Denies sore throat currently, last episode started with strep pharyngitis.   Notably he was admitted in may of this year for GAS bacteremia with multiple infective implants that required multiple surgical interventions and weeks of antibiotics. He states that it has been a few months since he finished all of his antibiotics.  Workup for this, including HIV/RPR, was unrevealing.  Patient Active Problem List  Diagnosis  . Hip pain, left   Past Medical History: Past Medical History  Diagnosis Date  . Hypertension    Past Surgical History: Past Surgical History  Procedure Date  . Back surgery   . Radical neck dissection 04/17/2012    Procedure: RADICAL NECK DISSECTION;  Surgeon: Flo Shanks, MD;  Location: [facility] OR;  Service: ENT;  Laterality: Right;  Right Neck Exploration  . Direct laryngoscopy 04/17/2012    Procedure: DIRECT LARYNGOSCOPY;  Surgeon: Flo Shanks, MD;  Location: [facility] OR;  Service: ENT;  Laterality: Bilateral;  . Rigid esophagoscopy 04/17/2012    Procedure: RIGID ESOPHAGOSCOPY;  Surgeon: Flo Shanks, MD;  Location: [facility] OR;  Service: ENT;  Laterality: Bilateral;  . I&d extremity 04/18/2012    Procedure: IRRIGATION AND DEBRIDEMENT EXTREMITY;  Surgeon: Sharma Covert, MD;  Location: [facility] OR;  Service: Orthopedics;  Laterality: Left;  lt forearm and wrist incicsion and drainage  . Lymph node biopsy 04/24/2012    Procedure: LYMPH NODE BIOPSY;  Surgeon: Flo Shanks, MD;  Location: [facility] OR;  Service: ENT;   Laterality: Right;  Right Neck/Upper Chest Exploration  . I&d extremity 04/24/2012    Procedure: IRRIGATION AND DEBRIDEMENT EXTREMITY;  Surgeon: Sharma Covert, MD;  Location: [facility] OR;  Service: Orthopedics;  Laterality: Left;   Social History: History  Substance Use Topics  . Smoking status: Never Smoker   . Smokeless tobacco: Never Used  . Alcohol Use: 1.5 oz/week    3 drink(s) per week   For any additional social history documentation, please refer to relevant sections of EMR.  Family History: History reviewed. No pertinent family history. Allergies: No Known Allergies No current facility-administered medications on file prior to encounter.   Current Outpatient Prescriptions on File Prior to Encounter  Medication Sig Dispense Refill  . acetaminophen (TYLENOL) 325 MG tablet Take 650 mg by mouth every 6 (six) hours as needed. pain      . Aspirin-Acetaminophen (GOODYS BODY PAIN PO) Take 1 Package by mouth daily as needed. For pain       Review Of Systems: Per HPI, otherwise 12 point review of systems was performed and was unremarkable.  Physical Exam: BP 155/96  Pulse 73  Temp 98.2 F (36.8 C) (Oral)  Resp 18  Ht 5\' 4"  (1.626 m)  Wt 120 lb (54.432 kg)  BMI 20.60 kg/m2  SpO2 100% Exam: Gen: NAD, alert, cooperative with exam HEENT: NCAT, EOMI, PERRL, oral mucosa moist and pink CV: RRR, no murmur Resp: CTABL, no wheezes, non-labored Abd: SNTND, BS present, no guarding or organomegaly Ext: No edema, very tender to palpation in the left inguinal area and significant pain with passive and active movement. Passive ROM limited by pain, no erythema of L hip or inguinal area.  Neuro: Alert and oriented, No gross deficits   Labs and Imaging: CBC BMET   Lab 09/15/12 1812  WBC 5.4  HGB 14.4  HCT 42.7  PLT 217    Lab 09/15/12 1812 09/15/12 1459  NA -- 138  K -- 4.3  CL -- 101  CO2 -- 25  BUN -- 7  CREATININE 0.88 --  GLUCOSE -- 86  CALCIUM -- 10.0    ESR: 4   DG  Wrist complete 09/15/2012 IMPRESSION:  No acute fracture or subluxation. There is narrowing of radiocarpal joint space. Soft tissue swelling is noted dorsal carpal region. Tiny bony fragment dorsal aspect of the lunate appears well corticated probable due to prior injury. Mild degenerative changes carpal region.  DG L hip 09/15/2012 IMPRESSION:  Marked asymmetric destruction of the left hip joint, likely progressed from abdominal CT performed 03/2012. Correlation for signs and symptoms of septic arthritis is recommended.     [name], MD 09/15/2012, 7:08 PM   I have seen and evaluated the patient and agree with the above. Detravion Tester 09/15/2012,7:43 PM

## 2012-09-15 NOTE — ED Provider Notes (Signed)
History   This chart was scribed for Cyndra Numbers, MD by Melba Coon. The patient was seen in room TR04C/TR04C and the patient's care was started at 11:45AM.    CSN: [id number]  Arrival date & time 09/15/12  1121   First MD Initiated Contact with Patient 09/15/12 1129      No chief complaint on file.   (Consider location/radiation/quality/duration/timing/severity/associated sxs/prior treatment) The history is provided by the patient. No language interpreter was used.   [name] is a 51 y.o. male who presents to the Emergency Department complaining of constant, moderate to severe left hip pain with an onset a few weeks ago. He states that he feels like something is "eating" on his hip. [name] has seen orthopedists for his hip in April and states that it was tapped; states he didn't know if he was told whether there was an infection present. Tylenol prn did not alleviate the pain. Diarrhea present.  No fever, nausea or vomit. He has already been tested for a heart infection, HIV, and diabetes which were negative. [name] also complains of left hand pain as well. [name] has a hx of a septic joint in his left hand but states that present pain is different than his baseline joint pain. Numbness present in his left hand, and states that his right hand is starting to become numb as well. No known allergies. No other pertinent medical symptoms.  No PCP  Past Medical History  Diagnosis Date  . Hypertension     Past Surgical History  Procedure Date  . Back surgery   . Radical neck dissection 04/17/2012    Procedure: RADICAL NECK DISSECTION;  Surgeon: Flo Shanks, MD;  Location: [facility] OR;  Service: ENT;  Laterality: Right;  Right Neck Exploration  . Direct laryngoscopy 04/17/2012    Procedure: DIRECT LARYNGOSCOPY;  Surgeon: Flo Shanks, MD;  Location: [facility] OR;  Service: ENT;  Laterality: Bilateral;  . Rigid esophagoscopy 04/17/2012    Procedure: RIGID ESOPHAGOSCOPY;  Surgeon:  Flo Shanks, MD;  Location: [facility] OR;  Service: ENT;  Laterality: Bilateral;  . I&d extremity 04/18/2012    Procedure: IRRIGATION AND DEBRIDEMENT EXTREMITY;  Surgeon: Sharma Covert, MD;  Location: [facility] OR;  Service: Orthopedics;  Laterality: Left;  lt forearm and wrist incicsion and drainage  . Lymph node biopsy 04/24/2012    Procedure: LYMPH NODE BIOPSY;  Surgeon: Flo Shanks, MD;  Location: [facility] OR;  Service: ENT;  Laterality: Right;  Right Neck/Upper Chest Exploration  . I&d extremity 04/24/2012    Procedure: IRRIGATION AND DEBRIDEMENT EXTREMITY;  Surgeon: Sharma Covert, MD;  Location: [facility] OR;  Service: Orthopedics;  Laterality: Left;    No family history on file.  History  Substance Use Topics  . Smoking status: Never Smoker   . Smokeless tobacco: Never Used  . Alcohol Use: 1.5 oz/week    3 drink(s) per week      Review of Systems 10 Systems reviewed and all are negative for acute change except as noted in the HPI.   Allergies  Review of patient's allergies indicates no known allergies.  Home Medications   Current Outpatient Rx  Name Route Sig Dispense Refill  . ACETAMINOPHEN 325 MG PO TABS Oral Take 650 mg by mouth every 6 (six) hours as needed. pain    . GOODYS BODY PAIN PO Oral Take 1 Package by mouth daily as needed. For pain    . COLCHICINE 0.6 MG PO TABS Oral Take 1 tablet (  0.6 mg total) by mouth 2 (two) times daily. 60 tablet 3  . METOPROLOL TARTRATE 25 MG PO TABS Oral Take 1 tablet (25 mg total) by mouth 2 (two) times daily. 60 tablet 3  . NAPROXEN SODIUM 220 MG PO TABS Oral Take 440 mg by mouth daily as needed. For pain    . PROMETHAZINE HCL 12.5 MG PO TABS Oral Take 1 tablet (12.5 mg total) by mouth every 6 (six) hours as needed for nausea. 30 tablet 0    BP 144/101  Pulse 99  Temp 98.2 F (36.8 C) (Oral)  Resp 20  Ht 5\' 4"  (1.626 m)  Wt 120 lb (54.432 kg)  BMI 20.60 kg/m2  SpO2 96%  Physical Exam GEN: Well-developed, well-nourished male in no  distress HEENT: Atraumatic, normocephalic.  EYES: PERRLA BL NECK: Trachea midline, no meningismus CV: regular rate and rhythm. No murmurs, rubs, or gallops PULM: No respiratory distress.  No crackles, wheezes, or rales. GI: soft, non-tender. No guarding, rebound, or tenderness. + bowel sounds  GU: deferred Neuro: cranial nerves grossly 2-12 intact, no abnormalities of strength or sensation, A and O x 3 MSK: left leg with decreased muscle tone compared to right. Exquisitely TTP over the left hip, pain on even slight flexion of left hip Skin: No rashes petechiae, purpura, or jaundice Psych: no abnormality of mood  ED Course  Procedures (including critical care time)  DIAGNOSTIC STUDIES: Oxygen Saturation is 96% on room air, normal by my interpretation.    COORDINATION OF CARE:  11:52AM - [name] will be transferred to CDU due to the complexity of his issues and morphine, IV fluids, BMP, CBC, and left hip XR will be ordered for the pt.   Labs Reviewed - No data to display No results found.   No diagnosis found.    MDM  Patient was briefly evaluated by myself. Based on past medical history with review of chart patient has had sepsis with septic joint in the past and no history or defecation of immunocompromise. Recent complicated past medical course necessitated more intense workup and could be completed in fast track area. Report was called to Dr. Oletta Lamas.  Patient will be moved to pod A. For further evaluation and treatment. Initial orders for IV fluids, morphine, CBC, metabolic panel, and left hip film were placed.  I personally performed the services described in this documentation, which was scribed in my presence. The recorded information has been reviewed and considered.           Cyndra Numbers, MD 09/15/12 1204

## 2012-09-15 NOTE — ED Provider Notes (Signed)
Physical Exam  BP 144/101  Pulse 99  Temp 98.2 F (36.8 C) (Oral)  Resp 20  Ht 5\' 4"  (1.626 m)  Wt 120 lb (54.432 kg)  BMI 20.60 kg/m2  SpO2 96%  Physical Exam  Thin appearance in NAD.  Heart S1S2, no M/R/G, lung CTAB, abd soft and nontender, L wrist with well healing vertical surgical scar to dorum, and a palpable nodule along the scar, ttp.  Decreased ROM due to pain, decreased grip strength.  Radial pulse palpable.  No erythema or abscess noted.  L Hip: tender along the inguinal crease and along the medial and latera aspect of thigh on palpation.  No overlying skin changes or abscess noted.  Decreased ROM due to pain.  Unable to lift leg against gravity.  Patella DTR 1+, no foot drop.    ED Course  Procedures  MDM Pt were briefly evaluated by Dr. Alto Denver in Fast Track, and was moved to [facility] A for further evaluation.  This 51 year old gentlemen has hx of septic joint in the past, but no hx of immunocompromise.  He has had prior extensive evaluation and treatment in April of this year with back surgery, radical neck dissection, I&D and debridement, lymph node biopsy for similar complaints.    He presents with worsening L hip pain x 1 week.  Pain is sharp, grinding worsening with movement.  Unable to ambulate at times.  Pain does radiates down to L leg with movement, has occasional numbness.  Denies fever, but endorse occasional chills.  Also has pain to L wrist and hand, same area that Dr. Melvyn Novas has done prior surgery for septic arthritis.  Pain is sharp, achy, with increase numbness.    1:46 PM Plan to obtain L hip CT with contrast for further evaluation, xray of L hand.  Pain medication given.  Will continue to monitor.  Will likely admit pt.    1:58 PM Radiologist has called and recommended to obtain fluoro guided L hip aspiration for evaluation and to r/o infectious process as this is a rapidly progressive changes to his L hip as compare to imaging from April.  He recommend against CT  scan at this time, as it may not provide additional information.  I discussed with my attending and also placed the appropriate order.  Pt is made aware of plan.  Pt made NPO.   2:19 PM i have spoken with Dr. Grace Isaac from IR who is aware of plan.  Will placed wound culture order and pt will have fluoro guided aspiration of his L hip.   3:46 PM Pt is afebrile with normal WBC.  He does not appears toxic.    4:41 PM Pt has fluoro L hip aspiration performed.  Wound cultures including glucose, gram stain and protein were placed by Dr. Alto Denver.  Dr. Alto Denver will consult orthopedic surgery for further management of the pt as he will likely benefit from joint washout if suspicion for septic arthritis.  Pt aware of plan.  Dr. Alto Denver will dispo pt as appropriate.    5:07 PM i have consulted to [facility] for this unassigned pt.  Pt will be admit to a med surg bed, Attending Dr. Mauricio Po.  My attending is aware of plan.   Fayrene Helper, PA-C 09/15/12 1709  Medical screening examination/treatment/procedure(s) were conducted as a shared visit with non-physician practitioner(s) and myself.  I personally evaluated the patient during the encounter.  I saw this patient initially in fast track and patient was  then signed out to pod A attending.  PA Laveda Norman assumed care but did not discuss performance of hip arthrocentesis with attending until after performance.  On my evaluation patient was HDS with very tender hip.  Initial lab studies and x-ray were ordered prior to my sign-off to other attending.  Patient was discussed with me again after all studies had returned and I was told patient had not yet been discussed with another attending.  Patient had had stable vital signs and no leukocytosis.  Additionally fluoro tap of the left hip had been performed without discovery of fluid on entry into the joint.  All of this argued that patient did not have septic arthritis despite notation of severe changes on hip plain film.  I  discussed the patient with Dr. Rennis Chris who felt that this was unlikely to be a septic hip and did not recommend antibiotics at this time.  Patient was discussed with family medicine by myself as well.  Patient admitted for severe pain as well as to await results of joint fluid studies.  Patient unlikely to have septic arthritis at this time but given history of left hand septic arthritis in April with other associated severe infection and no identified cause or immunocompromise admission felt to be prudent.  Cyndra Numbers, MD 09/16/12 [phone]

## 2012-09-15 NOTE — Progress Notes (Signed)
ANTIBIOTIC CONSULT NOTE - INITIAL  Pharmacy Consult for Vancomycin Indication: possible septic left hip  No Known Allergies  Patient Measurements: Height: 5\' 4"  (162.6 cm) Weight: 120 lb (54.432 kg) IBW/kg (Calculated) : 59.2  Adjusted Body Weight: 54 kg  Vital Signs: Temp: 98.2 F (36.8 C) (09/22 1128) Temp src: Oral (09/22 1128) BP: 155/96 mmHg (09/22 1805) Pulse Rate: 73  (09/22 1805) Intake/Output from previous day:   Intake/Output from this shift:    Labs:  Basename 09/15/12 1459 09/15/12 1156  WBC -- 5.2  HGB -- 15.8  PLT -- 215  LABCREA -- --  CREATININE 0.87 --   Estimated Creatinine Clearance: 76.4 ml/min (by C-G formula based on Cr of 0.87).   Microbiology: No results found for this or any previous visit (from the past 720 hour(s)).  Medical History: Past Medical History  Diagnosis Date  . Hypertension     Medications:  Acetaminophen, Goodys body pain (ASA/APAP) Assessment: 51 year old man to be admitted for treatment of septic hip to start on Zosyn and Vancomycin.  Goal of Therapy:  Vancomycin trough level 15-20 mcg/ml  Plan:  Vancomycin 750 mg IV q12.  Continue Zosyn at 3.375g IV q8h (4 hour infusion)  Mickeal Skinner 09/15/2012,6:17 PM

## 2012-09-15 NOTE — ED Notes (Signed)
Patient complains of left hip pain.  Patient states he has had increased pain for a few weeks.  Patient is also having pain in his left hand.  Patient states he can hardly walk due to pain.  Patient states his last xray was done 04-13

## 2012-09-15 NOTE — ED Notes (Signed)
Wrong documentation for visual acuity/ wrong documentation on pt.

## 2012-09-15 NOTE — Procedures (Signed)
Technically successful fluoroscopic guided arthrocentesis of the left hip. Approximately 1 cc of fluid sent to lab for analysis. No immediate complications.

## 2012-09-15 NOTE — ED Notes (Signed)
Pt stated, i've had lt. Hip pain since they stuck a needle in my hip back in April.

## 2012-09-15 NOTE — H&P (Signed)
FMTS Attending Admit Note Patient seen and examined by me, discussed with Dr Louanne Belton and I agree with his assessment and plan as documented in note.   Patient reports that his L hip and wrist pain have been getting progressively worse over the past 1-2 weeks; no fevers but has had some chills.  He denies chronic steroid use, was a casual smoker in his teens but has not smoked in adulthood. Denies IV drug use at any time in the past.  Was discharged from his prolonged hospital stay this past May without any indwelling lines.  Reports that he may have had hardware in back after a back surgery in 2007, however he is unclear about this.  The DC summary from previous admission is reviewed, including the results of ECHO done at that time which did not show any valvular vegetations.   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  It is somewhat reassuring that his WBC count and ESR are not elevated.  We are awaiting gram stain and cultures of L hip aspirate.  Agree with plan for antibiotic coverage while awaiting culture results.  Of note, patient with history of gout and had attack while hospitalized in May; he says this does not feel at all like his usual gouty attack.  Agree with checking uric acid serum, consider reviewing aspirate with polarizing microscopy for birefringent crystals. Would consult Orthopedics service in am.   Paula Compton, MD

## 2012-09-15 NOTE — ED Notes (Signed)
No orders for floor RN at this time.  Will wait for orders before calling report.

## 2012-09-16 ENCOUNTER — Encounter (HOSPITAL_COMMUNITY): Payer: Self-pay | Admitting: General Practice

## 2012-09-16 LAB — URIC ACID: Uric Acid, Serum: 6.9 mg/dL (ref 4.0–7.8)

## 2012-09-16 LAB — SYNOVIAL CELL COUNT + DIFF, W/ CRYSTALS
Crystals, Fluid: NONE SEEN
Lymphocytes-Synovial Fld: NONE SEEN % (ref 0–20)
Monocyte-Macrophage-Synovial Fluid: NONE SEEN % (ref 50–90)
Neutrophil, Synovial: NONE SEEN % (ref 0–25)
WBC, Synovial: 0 /mm3 (ref 0–200)

## 2012-09-16 LAB — CBC
HCT: 43.2 % (ref 39.0–52.0)
Hemoglobin: 14 g/dL (ref 13.0–17.0)
MCH: 21.9 pg — ABNORMAL LOW (ref 26.0–34.0)
MCHC: 32.4 g/dL (ref 30.0–36.0)
MCV: 67.7 fL — ABNORMAL LOW (ref 78.0–100.0)
Platelets: 218 K/uL (ref 150–400)
RBC: 6.38 MIL/uL — ABNORMAL HIGH (ref 4.22–5.81)
RDW: 20.7 % — ABNORMAL HIGH (ref 11.5–15.5)
WBC: 6.4 K/uL (ref 4.0–10.5)

## 2012-09-16 MED ORDER — HYDROMORPHONE HCL PF 1 MG/ML IJ SOLN
1.0000 mg | INTRAMUSCULAR | Status: DC | PRN
  Administered 2012-09-16 – 2012-09-18 (×4): 1 mg via INTRAVENOUS
  Filled 2012-09-16 (×4): qty 1

## 2012-09-16 MED ORDER — OXYCODONE HCL 10 MG PO TB12
10.0000 mg | ORAL_TABLET | Freq: Two times a day (BID) | ORAL | Status: DC
  Administered 2012-09-16 – 2012-09-20 (×8): 10 mg via ORAL
  Filled 2012-09-16 (×8): qty 1

## 2012-09-16 MED ORDER — INFLUENZA VIRUS VACC SPLIT PF IM SUSP
0.5000 mL | INTRAMUSCULAR | Status: AC
  Administered 2012-09-17: 0.5 mL via INTRAMUSCULAR
  Filled 2012-09-16: qty 0.5

## 2012-09-16 MED ORDER — OXYCODONE HCL 5 MG PO TABS
5.0000 mg | ORAL_TABLET | ORAL | Status: DC | PRN
  Administered 2012-09-16 – 2012-09-20 (×7): 5 mg via ORAL
  Filled 2012-09-16 (×9): qty 1

## 2012-09-16 MED ORDER — ACETAMINOPHEN 325 MG PO TABS
650.0000 mg | ORAL_TABLET | Freq: Three times a day (TID) | ORAL | Status: DC
  Administered 2012-09-16 – 2012-09-21 (×14): 650 mg via ORAL
  Filled 2012-09-16 (×12): qty 2

## 2012-09-16 MED ORDER — LISINOPRIL 10 MG PO TABS
10.0000 mg | ORAL_TABLET | Freq: Every day | ORAL | Status: DC
  Administered 2012-09-17 – 2012-09-21 (×6): 10 mg via ORAL
  Filled 2012-09-16 (×6): qty 1

## 2012-09-16 MED ORDER — ACETAMINOPHEN 650 MG RE SUPP: 650.0000 mg | Freq: Three times a day (TID) | RECTAL | Status: DC

## 2012-09-16 NOTE — Progress Notes (Signed)
Family Medicine Teaching Service Daily Progress Note Service Page: [phone]  Patient Assessment: 51 y/o male with PMH of GAS bacteremia with multiple infective foci here with L hip pain for 1 week.   Subjective:  No acute events overnight. Pain helped by meds but no improvement. Reports some nausea and 3 episodes of vomiting last night. Not associateed with pain med admin, frst one had a small amount of blood in it and the other two were NBNB.   Objective: Temp:  [97.3 F (36.3 C)-98.2 F (36.8 C)] 97.3 F (36.3 C) (09/23 0527) Pulse Rate:  [68-100] 100  (09/23 0527) Resp:  [16-20] 16  (09/23 0527) BP: (138-155)/(89-104) 143/100 mmHg (09/23 0527) SpO2:  [96 %-100 %] 100 % (09/23 0527) Weight:  [120 lb (54.432 kg)] 120 lb (54.432 kg) (09/22 1126) Exam: Gen: NAD, alert, cooperative with exam  HEENT: NCAT, EOMI, PERRL, oral mucosa moist and pink  CV: RRR, no murmur  Resp: CTABL, no wheezes, non-labored  Abd: SNTND, BS present, no guarding or organomegaly  Ext: No edema, very tender to palpation in the left inguinal area and left lateral hip area. significant pain with passive and active movement. Active ROM improved overnight, no erythema of L hip or inguinal area. No inguinal LAD Neuro: Alert and oriented, No gross deficits  I have reviewed the patient's medications, labs, imaging, and diagnostic testing.  Notable results are summarized below.  CBC BMET   Lab 09/16/12 0500 09/15/12 1812 09/15/12 1156  WBC 6.4 5.4 5.2  HGB 14.0 14.4 15.8  HCT 43.2 42.7 46.9  PLT 218 217 215    Lab 09/15/12 1812 09/15/12 1459  NA -- 138  K -- 4.3  CL -- 101  CO2 -- 25  BUN -- 7  CREATININE 0.88 0.87  GLUCOSE -- 86  CALCIUM -- 10.0     Imaging/Diagnostic Tests: DG Wrist complete 09/15/2012  IMPRESSION:  No acute fracture or subluxation. There is narrowing of radiocarpal joint space. Soft tissue swelling is noted dorsal carpal region. Tiny bony fragment dorsal aspect of the  lunate appears well corticated probable due to prior injury. Mild degenerative changes carpal region.  DG L hip 09/15/2012  IMPRESSION:  Marked asymmetric destruction of the left hip joint, likely progressed from abdominal CT performed 03/2012. Correlation for signs and symptoms of septic arthritis is recommended.    Plan: [name] is a 51 y.o. year old male with PMH of GAS bacteremia with multiple infective foci presenting with L hip and L wrist pain and subsequent difficulty walking for 1 week.  1. Left hip and wrist pain: Likely , it's not a septic arthritis with normal WBC, afebrile, and ESR. After discussion with ortho its ppossibly a degenerative change after a septic arthritis in the past. Other DDx includes osteoarthritis, gout, RA, and lyme arthritis. Osteo is certainly a consideration with his x ray changes, but why at the age of 51 it has developed is unknown. Inflammatory arthritis' like RA, lyme arthritis, and gout, are less likely because of normal ESR, characteristic symptoms, and lack of exposure.  1. Aspiration fluid studies pending 2. Empiric IV vanc and Zosyn 3. Pain control with PRN norco and IV morphine 4. Uric acid level pending 5. Ortho consulted- recommendations much appreciated!  2. HTN: No current antihypertensives but was treated during last admission  1. 138-155/89-104 2. Consider starting acei or restarting his BB today  3. FEN/GI: Regular diet 4. Prophylaxis: Lovenox  5. Disposition: Observation, home pending studies and  symptom improvement and lab results. 6. Code Status: Full for now, will clarify   [name], MD 09/16/2012, 9:16 AM

## 2012-09-16 NOTE — Progress Notes (Signed)
Utilization review completed. Savanna Dooley, RN, BSN. 

## 2012-09-16 NOTE — Consult Note (Signed)
Reason for Consult:evaluate left hip pain Referring Physician:Dr. Ules Marsala [name] is an 51 y.o. male.  HPI: The patient is a very pleasant 51 year old male who was hospitalized in April of this year for multifocal group a streptococcal infection involving the left wrist, [place] joint and possibly the ankle.  There is some issue with the thigh as well but I don't believe there is concern for septic arthritis of the hip at that time.  He was treated on IV antibiotics and symptoms mostly resolved.he has continued to have some on and off left groin pain which has gotten a lot worse over the past 2-3 weeks.  He is walking with a cane and has severe pain and some crepitance in the hip.  He has not had any signs or symptoms of recurrent infection.  No fevers chills, nausea vomiting.   Past Medical History  Diagnosis Date  . Hypertension     Past Surgical History  Procedure Date  . Back surgery   . Radical neck dissection 04/17/2012    Procedure: RADICAL NECK DISSECTION;  Surgeon: Flo Shanks, MD;  Location: [facility] OR;  Service: ENT;  Laterality: Right;  Right Neck Exploration  . Direct laryngoscopy 04/17/2012    Procedure: DIRECT LARYNGOSCOPY;  Surgeon: Flo Shanks, MD;  Location: [facility] OR;  Service: ENT;  Laterality: Bilateral;  . Rigid esophagoscopy 04/17/2012    Procedure: RIGID ESOPHAGOSCOPY;  Surgeon: Flo Shanks, MD;  Location: [facility] OR;  Service: ENT;  Laterality: Bilateral;  . I&d extremity 04/18/2012    Procedure: IRRIGATION AND DEBRIDEMENT EXTREMITY;  Surgeon: Sharma Covert, MD;  Location: [facility] OR;  Service: Orthopedics;  Laterality: Left;  lt forearm and wrist incicsion and drainage  . Lymph node biopsy 04/24/2012    Procedure: LYMPH NODE BIOPSY;  Surgeon: Flo Shanks, MD;  Location: [facility] OR;  Service: ENT;  Laterality: Right;  Right Neck/Upper Chest Exploration  . I&d extremity 04/24/2012    Procedure: IRRIGATION AND DEBRIDEMENT EXTREMITY;  Surgeon: Sharma Covert, MD;  Location: [facility] OR;   Service: Orthopedics;  Laterality: Left;    History reviewed. No pertinent family history.  Social History:  reports that he has never smoked. He has never used smokeless tobacco. He reports that he drinks about 1.5 ounces of alcohol per week. He reports that he does not use illicit drugs.  Allergies: No Known Allergies  Medications: I have reviewed the patient's current medications.  Results for orders placed during the hospital encounter of 09/15/12 (from the past 48 hour(s))  CBC WITH DIFFERENTIAL     Status: Abnormal   Collection Time   09/15/12 11:56 AM      Component Value Range Comment   WBC 5.2  4.0 - 10.5 K/uL    RBC 7.08 (*) 4.22 - 5.81 MIL/uL    Hemoglobin 15.8  13.0 - 17.0 g/dL    HCT 16.1  09.6 - 04.5 %    MCV 66.2 (*) 78.0 - 100.0 fL    MCH 22.3 (*) 26.0 - 34.0 pg    MCHC 33.7  30.0 - 36.0 g/dL    RDW 40.9 (*) 81.1 - 15.5 %    Platelets 215  150 - 400 K/uL    Neutrophils Relative 45  43 - 77 %    Neutro Abs 2.3  1.7 - 7.7 K/uL    Lymphocytes Relative 38  12 - 46 %    Lymphs Abs 2.0  0.7 - 4.0 K/uL    Monocytes Relative 6  3 - 12 %    Monocytes Absolute 0.3  0.1 - 1.0 K/uL    Eosinophils Relative 10 (*) 0 - 5 %    Eosinophils Absolute 0.5  0.0 - 0.7 K/uL    Basophils Relative 1  0 - 1 %    Basophils Absolute 0.1  0.0 - 0.1 K/uL   PROTIME-INR     Status: Normal   Collection Time   09/15/12 11:56 AM      Component Value Range Comment   Prothrombin Time 12.7  11.6 - 15.2 seconds    INR 0.96  0.00 - 1.49   COMPREHENSIVE METABOLIC PANEL     Status: Abnormal   Collection Time   09/15/12  2:59 PM      Component Value Range Comment   Sodium 138  135 - 145 mEq/L    Potassium 4.3  3.5 - 5.1 mEq/L    Chloride 101  96 - 112 mEq/L    CO2 25  19 - 32 mEq/L    Glucose, Bld 86  70 - 99 mg/dL    BUN 7  6 - 23 mg/dL    Creatinine, Ser 5.78  0.50 - 1.35 mg/dL    Calcium 46.9  8.4 - 10.5 mg/dL    Total Protein 8.9 (*) 6.0 - 8.3 g/dL    Albumin 3.7  3.5 - 5.2 g/dL    AST  32  0 - 37 U/L    ALT 12  0 - 53 U/L    Alkaline Phosphatase 115  39 - 117 U/L    Total Bilirubin 0.3  0.3 - 1.2 mg/dL    GFR calc non Af Amer >90  >90 mL/min    GFR calc Af Amer >90  >90 mL/min   SEDIMENTATION RATE     Status: Normal   Collection Time   09/15/12  3:37 PM      Component Value Range Comment   Sed Rate 5  0 - 16 mm/hr   BODY FLUID CULTURE     Status: Normal (Preliminary result)   Collection Time   09/15/12  3:45 PM      Component Value Range Comment   Specimen Description SYNOVIAL FLUID LEFT HIP      Special Requests JOINT      Gram Stain PENDING      Culture NO GROWTH 1 DAY      Report Status PENDING     CBC     Status: Abnormal   Collection Time   09/15/12  6:12 PM      Component Value Range Comment   WBC 5.4  4.0 - 10.5 K/uL    RBC 6.43 (*) 4.22 - 5.81 MIL/uL    Hemoglobin 14.4  13.0 - 17.0 g/dL    HCT 62.9  52.8 - 41.3 %    MCV 66.4 (*) 78.0 - 100.0 fL    MCH 22.4 (*) 26.0 - 34.0 pg    MCHC 33.7  30.0 - 36.0 g/dL    RDW 24.4 (*) 01.0 - 15.5 %    Platelets 217  150 - 400 K/uL   CREATININE, SERUM     Status: Normal   Collection Time   09/15/12  6:12 PM      Component Value Range Comment   Creatinine, Ser 0.88  0.50 - 1.35 mg/dL    GFR calc non Af Amer >90  >90 mL/min    GFR calc Af Amer >90  >90 mL/min   CBC  Status: Abnormal   Collection Time   09/16/12  5:00 AM      Component Value Range Comment   WBC 6.4  4.0 - 10.5 K/uL    RBC 6.38 (*) 4.22 - 5.81 MIL/uL    Hemoglobin 14.0  13.0 - 17.0 g/dL    HCT 45.4  09.8 - 11.9 %    MCV 67.7 (*) 78.0 - 100.0 fL    MCH 21.9 (*) 26.0 - 34.0 pg    MCHC 32.4  30.0 - 36.0 g/dL    RDW 14.7 (*) 82.9 - 15.5 %    Platelets 218  150 - 400 K/uL   URIC ACID     Status: Normal   Collection Time   09/16/12  8:13 AM      Component Value Range Comment   Uric Acid, Serum 6.9  4.0 - 7.8 mg/dL     Dg Wrist Complete Left  09/15/2012  *RADIOLOGY REPORT*  Clinical Data: Pain, no known injury  LEFT WRIST - COMPLETE 3+ VIEW   Comparison: None.  Findings: Four views of the left wrist submitted.  No acute fracture or subluxation.  There is narrowing of radiocarpal joint space.  Soft tissue swelling is noted dorsal carpal region.  Tiny bony fragment dorsal aspect of the lunate appears well corticated probable due to prior injury.  Mild degenerative changes carpal region.  IMPRESSION:  No acute fracture or subluxation.  There is narrowing of radiocarpal joint space.  Soft tissue swelling is noted dorsal carpal region.  Tiny bony fragment dorsal aspect of the lunate appears well corticated probable due to prior injury.  Mild degenerative changes carpal region.   Original Report Authenticated By: Natasha Mead, M.D.    Dg Hip Complete Left  09/15/2012  *RADIOLOGY REPORT*  Clinical Data: Left hip pain, history of septic joint.  LEFT HIP - COMPLETE 2+ VIEW  Comparison: CT abdomen pelvis - 04/17/2012  Findings: No definite fracture or dislocation.  Marked asymmetric destruction of the left hip with complete joint space loss, articular surface irregularity and subchondral increased sclerosis. These findings are likely progressed from the prior abdominal CT performed 03/2012.  Limited visualization of the contralateral right hip suggests mild degenerative change.   Regional soft tissues are normal.  No radiopaque foreign body. Limited visualization of the lower lumbar spine demonstrates sequela of prior lumbar fusion.  IMPRESSION:  Marked asymmetric destruction of the left hip joint, likely progressed from abdominal CT performed 03/2012.  Correlation for signs and symptoms of septic arthritis is recommended.   Original Report Authenticated By: Waynard Reeds, M.D.    Dg Arthro Hip Left  09/15/2012  *RADIOLOGY REPORT*  Indication:  Left hip pain, concern for septic arthritis  ARTHOROCENTESIS/INJECTION OF LARGE JOINT  Comparison: Left hip radiographs - 09/15/2012; abdominal CT - 04/17/2012  Intravenous medications:  None  Contrast: 3 ml Isovue-300   Fluoroscopy time: 1.4 minutes  Complications: None immediate  Technique / Findings:  Informed written consent was obtained from the patient after discussion of the risks, benefits and alternatives to treatment. The patient was placed prone on the fluoroscopy table and the left extremity was placed in a slight degree of flexion and internal rotation.  The left hip was localized with fluoroscopy.  The skin overlying the anterior aspect of the hip was prepped and draped in usual sterile fashion.  A 20 gauge spinal needle was advanced into the hip joint at the lateral aspect of the femoral head-neck junction, after the overlying  soft tissues were anesthetized with 1% lidocaine.  Contrast injection confirms appropriate positioning within the hip joint. A fluoroscopic image was saved and sent to PACs.  As no joint fluid was able to be aspirated from the hip joint, a small amount of saline was injected and subsequently aspirated.  At this point, the needle was removed and a dressing was placed.  The patient tolerated procedure well without immediate postprocedural complication.  Impression:  Successful fluoroscopic guided arthrocentesis of the left hip.  Above findings discussed with Laveda Norman, PA at [phone].   Original Report Authenticated By: Waynard Reeds, M.D.     Review of Systems  All other systems reviewed and are negative.   Blood pressure 152/100, pulse 74, temperature 98.1 F (36.7 C), temperature source Oral, resp. rate 18, height 5\' 4"  (1.626 m), weight 120 lb (54.432 kg), SpO2 100.00%. Physical Exam  Constitutional: He is oriented to person, place, and time. He appears well-developed and well-nourished.  HENT:  Head: Atraumatic.  Eyes: EOM are normal.  Cardiovascular: Intact distal pulses.   Respiratory: Effort normal.  Musculoskeletal:       Examination of the left lower extremity demonstrates significant limitation in internal and external rotation of the hip with severe pain.  There is no  palpable warmth or erythema about the hip.  No calf tenderness or swelling.  No pain with knee range of motion.  Distally he is neurovascularly intact.  Neurological: He is alert and oriented to person, place, and time.  Skin: Skin is warm and dry.  Psychiatric: He has a normal mood and affect.    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  I do not believe he has any signs of current infection.  With a normal white blood cell count, ESR, and no fevers he has no clinical signs of infection. 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 I think the main question is as to when this should be done.  He has been off of IV antibiotics for 3 months now and shows complete resolution and normalization of his labs.  I think it would be reasonable at this point to go ahead with hip replacement surgery. I do not personally do hip arthroplasty, but will talk to my partner who does and he can come evaluate the patient while in the hospital or the patient can follow up in our office after discharge.  Mable Paris 09/16/2012, 5:00 PM

## 2012-09-16 NOTE — Progress Notes (Signed)
I discussed with  Dr Bradshaw.  I agree with their plans documented in their progress note for today.  

## 2012-09-17 LAB — CBC
HCT: 44.3 % (ref 39.0–52.0)
Hemoglobin: 14.5 g/dL (ref 13.0–17.0)
MCV: 67.7 fL — ABNORMAL LOW (ref 78.0–100.0)
RDW: 20.4 % — ABNORMAL HIGH (ref 11.5–15.5)
WBC: 5.2 10*3/uL (ref 4.0–10.5)

## 2012-09-17 MED ORDER — CHLORHEXIDINE GLUCONATE 4 % EX LIQD
60.0000 mL | Freq: Once | CUTANEOUS | Status: AC
  Administered 2012-09-17: 4 via TOPICAL
  Filled 2012-09-17: qty 60

## 2012-09-17 MED ORDER — DEXTROSE-NACL 5-0.45 % IV SOLN: INTRAVENOUS | Status: DC

## 2012-09-17 NOTE — Progress Notes (Signed)
Patient called and c/o tingling sensation at IV site. Site assessed with no abnormality found. Flush line with saline flush and line patent but patient continued to c/o. New line ,20 gauge placed on right upper arm. No further c/o.

## 2012-09-17 NOTE — Consult Note (Signed)
Reason for Consult: End-stage arthritis of left hip with poorly controlled pain Referring Physician: Jones Broom M.D.  [name] is an 51 y.o. male.  HPI: Patient was admitted a few days ago with left hip and groin pain that was poorly controlled. Plan x-ray showed destruction of the left hip joint, with marketed progression compared to a CT scan that was done in April of 2013 when the patient was admitted for strep sepsis. During that period of time he had irrigation and debridement of his left arrest for pyarthrosis by Dr. Melvyn Novas, and a radical neck dissection also for abscesses by Dr. Lazarus Salines. Since that time he has come off of antibiotics and has been off of them for the last few months. He denies any fevers, or night sweats appear. 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 His left hip pain has been progressive over the last few weeks and let to his mission from the emergency room on 09/15/2012. His workup has included a hip aspiration with a negative Gram stain and cultures are now negative at 2 days. His peripheral white count is 6.5, his sedimentation rate is 5. Plain radiographs are consistent with end-stage arthritis of the left hip. He denies any swelling or erythema over the hip. His pain wakes him at night, prevents any chores, and prevents him from gainful employment. If possible, he would like his hip replaced during this hospitalization. Dr. Ave Filter has been kind enough to consult me to get this done. He is presently on broad-spectrum antibiotics including vancomycin and Piperacillin pending cultures.  Past Medical History  Diagnosis Date  . Hypertension     Past Surgical History  Procedure Date  . Back surgery   . Radical neck dissection 04/17/2012    Procedure: RADICAL NECK DISSECTION;  Surgeon: Flo Shanks, MD;  Location: [facility] OR;  Service: ENT;  Laterality: Right;  Right Neck Exploration  . Direct laryngoscopy 04/17/2012    Procedure:  DIRECT LARYNGOSCOPY;  Surgeon: Flo Shanks, MD;  Location: [facility] OR;  Service: ENT;  Laterality: Bilateral;  . Rigid esophagoscopy 04/17/2012    Procedure: RIGID ESOPHAGOSCOPY;  Surgeon: Flo Shanks, MD;  Location: [facility] OR;  Service: ENT;  Laterality: Bilateral;  . I&d extremity 04/18/2012    Procedure: IRRIGATION AND DEBRIDEMENT EXTREMITY;  Surgeon: Sharma Covert, MD;  Location: [facility] OR;  Service: Orthopedics;  Laterality: Left;  lt forearm and wrist incicsion and drainage  . Lymph node biopsy 04/24/2012    Procedure: LYMPH NODE BIOPSY;  Surgeon: Flo Shanks, MD;  Location: [facility] OR;  Service: ENT;  Laterality: Right;  Right Neck/Upper Chest Exploration  . I&d extremity 04/24/2012    Procedure: IRRIGATION AND DEBRIDEMENT EXTREMITY;  Surgeon: Sharma Covert, MD;  Location: [facility] OR;  Service: Orthopedics;  Laterality: Left;    History reviewed. No pertinent family history.  Social History:  reports that he has never smoked. He has never used smokeless tobacco. He reports that he drinks about 1.5 ounces of alcohol per week. He reports that he does not use illicit drugs.  Allergies: No Known Allergies  Medications: I have reviewed the patient's current medications.  Results for orders placed during the hospital encounter of 09/15/12 (from the past 48 hour(s))  CBC WITH DIFFERENTIAL     Status: Abnormal   Collection Time   09/15/12 11:56 AM      Component Value Range Comment   WBC 5.2  4.0 - 10.5 K/uL    RBC 7.08 (*)  4.22 - 5.81 MIL/uL    Hemoglobin 15.8  13.0 - 17.0 g/dL    HCT 91.4  78.2 - 95.6 %    MCV 66.2 (*) 78.0 - 100.0 fL    MCH 22.3 (*) 26.0 - 34.0 pg    MCHC 33.7  30.0 - 36.0 g/dL    RDW 21.3 (*) 08.6 - 15.5 %    Platelets 215  150 - 400 K/uL    Neutrophils Relative 45  43 - 77 %    Neutro Abs 2.3  1.7 - 7.7 K/uL    Lymphocytes Relative 38  12 - 46 %    Lymphs Abs 2.0  0.7 - 4.0 K/uL    Monocytes Relative 6  3 - 12 %    Monocytes Absolute 0.3  0.1 - 1.0 K/uL    Eosinophils Relative  10 (*) 0 - 5 %    Eosinophils Absolute 0.5  0.0 - 0.7 K/uL    Basophils Relative 1  0 - 1 %    Basophils Absolute 0.1  0.0 - 0.1 K/uL   PROTIME-INR     Status: Normal   Collection Time   09/15/12 11:56 AM      Component Value Range Comment   Prothrombin Time 12.7  11.6 - 15.2 seconds    INR 0.96  0.00 - 1.49   COMPREHENSIVE METABOLIC PANEL     Status: Abnormal   Collection Time   09/15/12  2:59 PM      Component Value Range Comment   Sodium 138  135 - 145 mEq/L    Potassium 4.3  3.5 - 5.1 mEq/L    Chloride 101  96 - 112 mEq/L    CO2 25  19 - 32 mEq/L    Glucose, Bld 86  70 - 99 mg/dL    BUN 7  6 - 23 mg/dL    Creatinine, Ser 5.78  0.50 - 1.35 mg/dL    Calcium 46.9  8.4 - 10.5 mg/dL    Total Protein 8.9 (*) 6.0 - 8.3 g/dL    Albumin 3.7  3.5 - 5.2 g/dL    AST 32  0 - 37 U/L    ALT 12  0 - 53 U/L    Alkaline Phosphatase 115  39 - 117 U/L    Total Bilirubin 0.3  0.3 - 1.2 mg/dL    GFR calc non Af Amer >90  >90 mL/min    GFR calc Af Amer >90  >90 mL/min   SEDIMENTATION RATE     Status: Normal   Collection Time   09/15/12  3:37 PM      Component Value Range Comment   Sed Rate 5  0 - 16 mm/hr   BODY FLUID CULTURE     Status: Normal (Preliminary result)   Collection Time   09/15/12  3:45 PM      Component Value Range Comment   Specimen Description SYNOVIAL FLUID LEFT HIP      Special Requests JOINT      Gram Stain PENDING      Culture NO GROWTH 1 DAY      Report Status PENDING     CELL COUNT + DIFF,  W/ CRYST-SYNVL FLD     Status: Abnormal   Collection Time   09/15/12  3:45 PM      Component Value Range Comment   Color, Synovial STRAW (*) YELLOW    Appearance-Synovial HAZY (*) CLEAR    Crystals, Fluid NO CRYSTALS SEEN  WBC, Synovial 0  0 - 200 /cu mm    Neutrophil, Synovial NONE SEEN  0 - 25 %    Lymphocytes-Synovial Fld NONE SEEN  0 - 20 %    Monocyte-Macrophage-Synovial Fluid NONE SEEN  50 - 90 %    Eosinophils-Synovial NONE SEEN  0 - 1 %    Other Cells-SYN TOO FEW  TO COUNT, SMEAR AVAILABLE FOR REVIEW     CBC     Status: Abnormal   Collection Time   09/15/12  6:12 PM      Component Value Range Comment   WBC 5.4  4.0 - 10.5 K/uL    RBC 6.43 (*) 4.22 - 5.81 MIL/uL    Hemoglobin 14.4  13.0 - 17.0 g/dL    HCT 16.1  09.6 - 04.5 %    MCV 66.4 (*) 78.0 - 100.0 fL    MCH 22.4 (*) 26.0 - 34.0 pg    MCHC 33.7  30.0 - 36.0 g/dL    RDW 40.9 (*) 81.1 - 15.5 %    Platelets 217  150 - 400 K/uL   CREATININE, SERUM     Status: Normal   Collection Time   09/15/12  6:12 PM      Component Value Range Comment   Creatinine, Ser 0.88  0.50 - 1.35 mg/dL    GFR calc non Af Amer >90  >90 mL/min    GFR calc Af Amer >90  >90 mL/min   CBC     Status: Abnormal   Collection Time   09/16/12  5:00 AM      Component Value Range Comment   WBC 6.4  4.0 - 10.5 K/uL    RBC 6.38 (*) 4.22 - 5.81 MIL/uL    Hemoglobin 14.0  13.0 - 17.0 g/dL    HCT 91.4  78.2 - 95.6 %    MCV 67.7 (*) 78.0 - 100.0 fL    MCH 21.9 (*) 26.0 - 34.0 pg    MCHC 32.4  30.0 - 36.0 g/dL    RDW 21.3 (*) 08.6 - 15.5 %    Platelets 218  150 - 400 K/uL   URIC ACID     Status: Normal   Collection Time   09/16/12  8:13 AM      Component Value Range Comment   Uric Acid, Serum 6.9  4.0 - 7.8 mg/dL   CBC     Status: Abnormal   Collection Time   09/17/12  5:45 AM      Component Value Range Comment   WBC 5.2  4.0 - 10.5 K/uL    RBC 6.54 (*) 4.22 - 5.81 MIL/uL    Hemoglobin 14.5  13.0 - 17.0 g/dL    HCT 57.8  46.9 - 62.9 %    MCV 67.7 (*) 78.0 - 100.0 fL    MCH 22.2 (*) 26.0 - 34.0 pg    MCHC 32.7  30.0 - 36.0 g/dL    RDW 52.8 (*) 41.3 - 15.5 %    Platelets 216  150 - 400 K/uL     Dg Wrist Complete Left  09/15/2012  *RADIOLOGY REPORT*  Clinical Data: Pain, no known injury  LEFT WRIST - COMPLETE 3+ VIEW  Comparison: None.  Findings: Four views of the left wrist submitted.  No acute fracture or subluxation.  There is narrowing of radiocarpal joint space.  Soft tissue swelling is noted dorsal carpal region.   Tiny bony fragment dorsal aspect of the lunate appears well corticated  probable due to prior injury.  Mild degenerative changes carpal region.  IMPRESSION:  No acute fracture or subluxation.  There is narrowing of radiocarpal joint space.  Soft tissue swelling is noted dorsal carpal region.  Tiny bony fragment dorsal aspect of the lunate appears well corticated probable due to prior injury.  Mild degenerative changes carpal region.   Original Report Authenticated By: Natasha Mead, M.D.    Dg Hip Complete Left  09/15/2012  *RADIOLOGY REPORT*  Clinical Data: Left hip pain, history of septic joint.  LEFT HIP - COMPLETE 2+ VIEW  Comparison: CT abdomen pelvis - 04/17/2012  Findings: No definite fracture or dislocation.  Marked asymmetric destruction of the left hip with complete joint space loss, articular surface irregularity and subchondral increased sclerosis. These findings are likely progressed from the prior abdominal CT performed 03/2012.  Limited visualization of the contralateral right hip suggests mild degenerative change.   Regional soft tissues are normal.  No radiopaque foreign body. Limited visualization of the lower lumbar spine demonstrates sequela of prior lumbar fusion.  IMPRESSION:  Marked asymmetric destruction of the left hip joint, likely progressed from abdominal CT performed 03/2012.  Correlation for signs and symptoms of septic arthritis is recommended.   Original Report Authenticated By: Waynard Reeds, M.D.    Dg Arthro Hip Left  09/15/2012  *RADIOLOGY REPORT*  Indication:  Left hip pain, concern for septic arthritis  ARTHOROCENTESIS/INJECTION OF LARGE JOINT  Comparison: Left hip radiographs - 09/15/2012; abdominal CT - 04/17/2012  Intravenous medications:  None  Contrast: 3 ml Isovue-300  Fluoroscopy time: 1.4 minutes  Complications: None immediate  Technique / Findings:  Informed written consent was obtained from the patient after discussion of the risks, benefits and alternatives to  treatment. The patient was placed prone on the fluoroscopy table and the left extremity was placed in a slight degree of flexion and internal rotation.  The left hip was localized with fluoroscopy.  The skin overlying the anterior aspect of the hip was prepped and draped in usual sterile fashion.  A 20 gauge spinal needle was advanced into the hip joint at the lateral aspect of the femoral head-neck junction, after the overlying soft tissues were anesthetized with 1% lidocaine.  Contrast injection confirms appropriate positioning within the hip joint. A fluoroscopic image was saved and sent to PACs.  As no joint fluid was able to be aspirated from the hip joint, a small amount of saline was injected and subsequently aspirated.  At this point, the needle was removed and a dressing was placed.  The patient tolerated procedure well without immediate postprocedural complication.  Impression:  Successful fluoroscopic guided arthrocentesis of the left hip.  Above findings discussed with [name] Norman, PA at [phone].   Original Report Authenticated By: Waynard Reeds, M.D.     ROS patient specifically denies any chest pain shortness of breath, fevers, night sweats. Blood pressure 138/89, pulse 68, temperature 97.5 F (36.4 C), temperature source Oral, resp. rate 18, height 5\' 4"  (1.626 m), weight 54.432 kg (120 lb), SpO2 100.00%. Physical Exam the left hip is held in 15 of external rotation, foot tap is negative, any attempts at range of motion causes significant pain and discomfort. The patient is relatively slender, there is no erythema or increased warmth over the left hip. He is neurovascularly intact. The surgical scar over his left wrist is well-healed there is 1+ swelling but no erythema the patient says the swelling has been there ever since the surgery per  Assessment/Plan: Progressive arthritis of the left hip between April 2013 and now with premonition complete destruction of the joint cartilage. With a normal  white count, sedimentation rate, hip aspirate that is Gram stain negative and culture negative it is unlikely that he is actively infected. With this in mind hip replacement is reasonable. I have called the operating room to set this up for 11 AM tomorrow Wednesday, 09/18/2012 the risks and benefits of surgery been discussed with the patient. If the joint appears to be infected at surgery, we will then perform a resection arthroplasty and not implant a total hip. The patient is aware of this as well.  [name] J 09/17/2012, 7:27 AM

## 2012-09-17 NOTE — Evaluation (Signed)
Physical Therapy Evaluation Patient Details Name: [name] MRN: [id number] DOB: 23-Nov-1961 Today's Date: 09/17/2012 Time: 0933-1000 PT Time Calculation (min): 27 min  PT Assessment / Plan / Recommendation Clinical Impression  Pt presents with L hip pain with history of I&D of abcess in neck, L wrist and [place] joint in April 2013.  Per MD notes, pt set to have THA tomorrow in am.  Pt tolerated OOB and ambulation in hallway with SPC, however is somewhat impulsive with all mobilty and constantly mentioned that hip is "bone on bone" and that he "can do nothing with it."  Provided exercises and went through four of them, however pt unable to fully complete set due to pain.  Pt will benefit from skilled PT in acute venue to address mobility deficits following THA.  PT recommends probable HHPT for follow up at D/C.      PT Assessment  Patient needs continued PT services    Follow Up Recommendations  Home health PT    Barriers to Discharge None      Equipment Recommendations  None recommended by PT    Recommendations for Other Services OT consult   Frequency 7X/week    Precautions / Restrictions Precautions Precautions: Fall Restrictions Weight Bearing Restrictions: No   Pertinent Vitals/Pain 5/10      Mobility  Bed Mobility Bed Mobility: Supine to Sit;Sitting - Scoot to Edge of Bed Supine to Sit: 5: Supervision;HOB elevated Sitting - Scoot to Edge of Bed: 5: Supervision Details for Bed Mobility Assistance: Supervision and cues for safety and technique.  Noted pt to be somewhat impulsive with mobility.  Transfers Transfers: Sit to Stand;Stand to Sit Sit to Stand: From elevated surface;With upper extremity assist;From bed;4: Min guard Stand to Sit: With upper extremity assist;With armrests;To chair/3-in-1;4: Min guard Details for Transfer Assistance: Min/guard for safety with cues for safety and hand placement when sitting/standing.  Ambulation/Gait Ambulation/Gait Assistance:  4: Min guard Ambulation Distance (Feet): 75 Feet Assistive device: Straight cane Ambulation/Gait Assistance Details: Min cues for safety.  Pt does well with sequencing with cane.  Again, pt somewhat impulsive with mobility.  Gait Pattern: Step-through pattern;Decreased stride length;Decreased stance time - left;Decreased step length - right Gait velocity: decreased Stairs: No    Exercises     PT Diagnosis: Difficulty walking;Generalized weakness;Acute pain  PT Problem List: Decreased strength;Decreased range of motion;Decreased activity tolerance;Decreased balance;Decreased mobility;Decreased safety awareness;Pain PT Treatment Interventions: DME instruction;Gait training;Stair training;Functional mobility training;Therapeutic activities;Therapeutic exercise;Balance training;Patient/family education   PT Goals Acute Rehab PT Goals PT Goal Formulation: With patient Time For Goal Achievement: 09/24/12 Potential to Achieve Goals: Good Pt will go Sit to Stand: with supervision PT Goal: Sit to Stand - Progress: Goal set today Pt will go Stand to Sit: with supervision PT Goal: Stand to Sit - Progress: Goal set today Pt will Ambulate: >150 feet;with supervision;with least restrictive assistive device PT Goal: Ambulate - Progress: Goal set today Pt will Go Up / Down Stairs: Flight;with supervision;with least restrictive assistive device;with rail(s) PT Goal: Up/Down Stairs - Progress: Goal set today Pt will Perform Home Exercise Program: with supervision, verbal cues required/provided PT Goal: Perform Home Exercise Program - Progress: Goal set today  Visit Information  Last PT Received On: 09/17/12 Assistance Needed: +1    Subjective Data  Subjective: I can't do anything with that hip Patient Stated Goal: I'm ready to have this surgery   Prior Functioning  Home Living Lives With: Spouse Available Help at Discharge: Family Type of Home:  House Home Access: Stairs to enter Water quality scientist of Steps: 1 Home Layout: Two level;Bed/bath upstairs Alternate Level Stairs-Number of Steps: flight Alternate Level Stairs-Rails: Right Bathroom Shower/Tub: Engineer, manufacturing systems: Standard Home Adaptive Equipment: Bedside commode/3-in-1;Straight cane;Walker - rolling Prior Function Level of Independence: Independent with assistive device(s) Able to Take Stairs?: Yes Driving: Yes Vocation: On disability Communication Communication: No difficulties    Cognition  Overall Cognitive Status: Appears within functional limits for tasks assessed/performed Arousal/Alertness: Awake/alert Orientation Level: Appears intact for tasks assessed Behavior During Session: [facility] for tasks performed    Extremity/Trunk Assessment Right Lower Extremity Assessment RLE ROM/Strength/Tone: WFL for tasks assessed RLE Sensation: WFL - Light Touch RLE Coordination: WFL - gross/fine motor Left Lower Extremity Assessment LLE ROM/Strength/Tone: Deficits LLE ROM/Strength/Tone Deficits: hip flex 2/5, hip abd 2/5, ankle motions WFL.  Unable to fully test due to pain.  LLE Sensation: WFL - Light Touch Trunk Assessment Trunk Assessment: Normal   Balance    End of Session PT - End of Session Activity Tolerance: Patient limited by pain Patient left: in chair;with call bell/phone within reach Nurse Communication: Mobility status  GP     Page, Meribeth Mattes 09/17/2012, 10:32 AM

## 2012-09-17 NOTE — Progress Notes (Signed)
Family Medicine Teaching Service Daily Progress Note Service Page: [phone]  Patient Assessment: 51 y/o male with PMH of GAS bacteremia with multiple infective foci here with L hip pain for 1 week.   Subjective:  No acute events overnight. Pain helped by meds but no improvement.denies nausea and vomiting. States that he lives with his wife, son, daughter and her three kids.  Glad to getting hip surgery. HE has been able to get and walk with his cane.   Objective: Temp:  [97.5 F (36.4 C)-98.1 F (36.7 C)] 97.5 F (36.4 C) (09/24 0641) Pulse Rate:  [68-75] 68  (09/24 0641) Resp:  [18] 18  (09/24 0641) BP: (138-152)/(89-103) 138/89 mmHg (09/24 0641) SpO2:  [100 %] 100 % (09/24 0641) Exam: Gen: NAD, alert, cooperative with exam  HEENT: NCAT, EOMI, PERRL, oral mucosa moist and pink  CV: RRR, no murmur  Resp: CTABL, no wheezes, non-labored  Abd: SNTND, BS present, no guarding or organomegaly  Ext: No edema, very tender to palpation in the left inguinal area and left lateral hip area. significant pain with passive and active movement. Active ROM improved overnight, no erythema of L hip or inguinal area. No inguinal LAD Neuro: Alert and oriented, No gross deficits  I have reviewed the patient's medications, labs, imaging, and diagnostic testing.  Notable results are summarized below.  CBC BMET   Lab 09/17/12 0545 09/16/12 0500 09/15/12 1812  WBC 5.2 6.4 5.4  HGB 14.5 14.0 14.4  HCT 44.3 43.2 42.7  PLT PENDING 218 217    Lab 09/15/12 1812 09/15/12 1459  NA -- 138  K -- 4.3  CL -- 101  CO2 -- 25  BUN -- 7  CREATININE 0.88 0.87  GLUCOSE -- 86  CALCIUM -- 10.0    serum uric acid : 6.9 Cell count from joint aspirate: WBC and no crystals Joint aspirate culture: NGTD  Imaging/Diagnostic Tests: DG Wrist complete 09/15/2012  IMPRESSION:  No acute fracture or subluxation. There is narrowing of radiocarpal joint space. Soft tissue swelling is noted dorsal carpal region. Tiny  bony fragment dorsal aspect of the lunate appears well corticated probable due to prior injury. Mild degenerative changes carpal region.  DG L hip 09/15/2012  IMPRESSION:  Marked asymmetric destruction of the left hip joint, likely progressed from abdominal CT performed 03/2012. Correlation for signs and symptoms of septic arthritis is recommended.    Plan: [name] is a 51 y.o. year old male with PMH of GAS bacteremia with multiple infective foci presenting with L hip and L wrist pain and subsequent difficulty walking for 1 week.  1. 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  1. Cell count with 0 WBC and no crystals, culture NGTD 2. Plan to dc Empiric IV vanc and Zosyn 3. Pain control with scheduled tylenol and oxycodone 4. Uric acid level 6.8 5. Ortho consulted- recommendations much appreciated! 1. Planning replacement tomorrow at 11 am.   2. HTN: No current antihypertensives but was treated during last admission  1. Average still around 140/95 2. Started lisinopril 10 yesterday, will increase to 20 if it continues to be high  3. FEN/GI: Regular diet 4. Prophylaxis: Lovenox  5. Disposition: Observation, home pending studies and symptom improvement and lab results. 6. Code Status: Full for now, will clarify   [name], MD 09/17/2012, 6:59 AM

## 2012-09-17 NOTE — Progress Notes (Signed)
I have seen and examined this patient. I have discussed with Dr Ermalinda Memos.  I agree with their findings and plans as documented in their progress note. Suspect patient is low risk from perioperative cardiac complications given lack of active cardiac conditions, baseline functional capacity > 4 mets and normal LV Ejection fraction by 03/2012 echocardiogram. Plan left hip arthroplasty tomorrow. Plan post-op starting DVT prophylaxis, e.g., Enoxaparin [place] for 10 to 14 days post-op starting 12 hours post-op

## 2012-09-18 ENCOUNTER — Encounter (HOSPITAL_COMMUNITY): Payer: Self-pay | Admitting: Anesthesiology

## 2012-09-18 ENCOUNTER — Inpatient Hospital Stay (HOSPITAL_COMMUNITY): Payer: Medicaid Other | Admitting: Anesthesiology

## 2012-09-18 ENCOUNTER — Inpatient Hospital Stay (HOSPITAL_COMMUNITY): Payer: Medicaid Other

## 2012-09-18 ENCOUNTER — Encounter (HOSPITAL_COMMUNITY)
Admission: EM | Disposition: A | Discharge status: Home Health | Payer: Self-pay | Source: Home / Self Care | Attending: Family Medicine

## 2012-09-18 HISTORY — PX: TOTAL HIP ARTHROPLASTY: SHX124

## 2012-09-18 LAB — CBC
Hemoglobin: 13.3 g/dL (ref 13.0–17.0)
MCH: 22 pg — ABNORMAL LOW (ref 26.0–34.0)
MCHC: 32.4 g/dL (ref 30.0–36.0)
Platelets: 175 10*3/uL (ref 150–400)
RDW: 20.7 % — ABNORMAL HIGH (ref 11.5–15.5)

## 2012-09-18 LAB — BODY FLUID CULTURE
Culture: NO GROWTH
Gram Stain: NONE SEEN

## 2012-09-18 SURGERY — ARTHROPLASTY, HIP, TOTAL,POSTERIOR APPROACH
Anesthesia: General | Site: Hip | Laterality: Left | Wound class: Clean

## 2012-09-18 MED ORDER — ALUM & MAG HYDROXIDE-SIMETH 200-200-20 MG/5ML PO SUSP: 30.0000 mL | ORAL | Status: DC | PRN

## 2012-09-18 MED ORDER — METHOCARBAMOL 500 MG PO TABS
500.0000 mg | ORAL_TABLET | Freq: Four times a day (QID) | ORAL | Status: DC | PRN
  Administered 2012-09-18 – 2012-09-19 (×3): 500 mg via ORAL
  Filled 2012-09-18 (×2): qty 1

## 2012-09-18 MED ORDER — BUPIVACAINE-EPINEPHRINE 0.5% -1:200000 IJ SOLN
INTRAMUSCULAR | Status: DC | PRN
  Administered 2012-09-18: 10 mL

## 2012-09-18 MED ORDER — HYDROMORPHONE HCL PF 1 MG/ML IJ SOLN
INTRAMUSCULAR | Status: AC
  Filled 2012-09-18: qty 1

## 2012-09-18 MED ORDER — MENTHOL 3 MG MT LOZG: 1.0000 | LOZENGE | OROMUCOSAL | Status: DC | PRN

## 2012-09-18 MED ORDER — CEFAZOLIN SODIUM 1-5 GM-% IV SOLN
1.0000 g | Freq: Four times a day (QID) | INTRAVENOUS | Status: AC
  Administered 2012-09-18: 1 g via INTRAVENOUS
  Filled 2012-09-18 (×2): qty 50

## 2012-09-18 MED ORDER — METOCLOPRAMIDE HCL 5 MG/ML IJ SOLN: 5.0000 mg | Freq: Three times a day (TID) | INTRAMUSCULAR | Status: DC | PRN

## 2012-09-18 MED ORDER — PROPOFOL 10 MG/ML IV BOLUS
INTRAVENOUS | Status: DC | PRN
  Administered 2012-09-18: 180 mg via INTRAVENOUS

## 2012-09-18 MED ORDER — OXYCODONE HCL 5 MG/5ML PO SOLN: 5.0000 mg | Freq: Once | ORAL | Status: DC | PRN

## 2012-09-18 MED ORDER — NEOSTIGMINE METHYLSULFATE 1 MG/ML IJ SOLN
INTRAMUSCULAR | Status: DC | PRN
  Administered 2012-09-18: 5 mg via INTRAVENOUS

## 2012-09-18 MED ORDER — METHOCARBAMOL 100 MG/ML IJ SOLN
500.0000 mg | Freq: Four times a day (QID) | INTRAVENOUS | Status: DC | PRN
  Filled 2012-09-18 (×2): qty 5

## 2012-09-18 MED ORDER — ENOXAPARIN SODIUM 40 MG/0.4ML ~~LOC~~ SOLN
40.0000 mg | SUBCUTANEOUS | Status: DC
  Filled 2012-09-18 (×2): qty 0.4

## 2012-09-18 MED ORDER — LACTATED RINGERS IV SOLN
INTRAVENOUS | Status: DC
  Administered 2012-09-18: 11:00:00 via INTRAVENOUS

## 2012-09-18 MED ORDER — DIPHENHYDRAMINE HCL 12.5 MG/5ML PO ELIX: 12.5000 mg | ORAL_SOLUTION | ORAL | Status: DC | PRN

## 2012-09-18 MED ORDER — PHENOL 1.4 % MT LIQD: 1.0000 | OROMUCOSAL | Status: DC | PRN

## 2012-09-18 MED ORDER — FLEET ENEMA 7-19 GM/118ML RE ENEM: 1.0000 | ENEMA | Freq: Once | RECTAL | Status: AC | PRN

## 2012-09-18 MED ORDER — BUPIVACAINE-EPINEPHRINE (PF) 0.5% -1:200000 IJ SOLN
INTRAMUSCULAR | Status: AC
  Filled 2012-09-18: qty 10

## 2012-09-18 MED ORDER — MIDAZOLAM HCL 5 MG/5ML IJ SOLN
INTRAMUSCULAR | Status: DC | PRN
  Administered 2012-09-18: 2 mg via INTRAVENOUS

## 2012-09-18 MED ORDER — LIDOCAINE HCL (CARDIAC) 20 MG/ML IV SOLN
INTRAVENOUS | Status: DC | PRN
  Administered 2012-09-18: 80 mg via INTRAVENOUS

## 2012-09-18 MED ORDER — DEXTROSE 5 % IV SOLN
INTRAVENOUS | Status: DC | PRN
  Administered 2012-09-18: 12:00:00 via INTRAVENOUS

## 2012-09-18 MED ORDER — ACETAMINOPHEN 325 MG PO TABS
650.0000 mg | ORAL_TABLET | Freq: Four times a day (QID) | ORAL | Status: DC | PRN
  Filled 2012-09-18: qty 2

## 2012-09-18 MED ORDER — ACETAMINOPHEN 10 MG/ML IV SOLN
15.0000 mg/kg | Freq: Four times a day (QID) | INTRAVENOUS | Status: DC
  Filled 2012-09-18 (×3): qty 81.6

## 2012-09-18 MED ORDER — KCL IN DEXTROSE-NACL 20-5-0.45 MEQ/L-%-% IV SOLN
INTRAVENOUS | Status: DC
  Administered 2012-09-19: 03:00:00 via INTRAVENOUS
  Administered 2012-09-20: 1000 mL via INTRAVENOUS
  Filled 2012-09-18 (×10): qty 1000

## 2012-09-18 MED ORDER — ONDANSETRON HCL 4 MG PO TABS: 4.0000 mg | ORAL_TABLET | Freq: Four times a day (QID) | ORAL | Status: DC | PRN

## 2012-09-18 MED ORDER — METOCLOPRAMIDE HCL 10 MG PO TABS: 5.0000 mg | ORAL_TABLET | Freq: Three times a day (TID) | ORAL | Status: DC | PRN

## 2012-09-18 MED ORDER — DEXAMETHASONE SODIUM PHOSPHATE 4 MG/ML IJ SOLN
INTRAMUSCULAR | Status: DC | PRN
  Administered 2012-09-18: 8 mg via INTRAVENOUS

## 2012-09-18 MED ORDER — LACTATED RINGERS IV SOLN
INTRAVENOUS | Status: DC | PRN
  Administered 2012-09-18 (×2): via INTRAVENOUS

## 2012-09-18 MED ORDER — MIDAZOLAM HCL 2 MG/2ML IJ SOLN: 1.0000 mg | INTRAMUSCULAR | Status: DC | PRN

## 2012-09-18 MED ORDER — ZOLPIDEM TARTRATE 5 MG PO TABS: 5.0000 mg | ORAL_TABLET | Freq: Every evening | ORAL | Status: DC | PRN

## 2012-09-18 MED ORDER — ONDANSETRON HCL 4 MG/2ML IJ SOLN
INTRAMUSCULAR | Status: DC | PRN
  Administered 2012-09-18: 4 mg via INTRAVENOUS

## 2012-09-18 MED ORDER — ROCURONIUM BROMIDE 100 MG/10ML IV SOLN
INTRAVENOUS | Status: DC | PRN
  Administered 2012-09-18: 40 mg via INTRAVENOUS

## 2012-09-18 MED ORDER — EPHEDRINE SULFATE 50 MG/ML IJ SOLN
INTRAMUSCULAR | Status: DC | PRN
  Administered 2012-09-18: 10 mg via INTRAVENOUS

## 2012-09-18 MED ORDER — CEFAZOLIN SODIUM-DEXTROSE 2-3 GM-% IV SOLR
INTRAVENOUS | Status: AC
  Filled 2012-09-18: qty 50

## 2012-09-18 MED ORDER — SODIUM CHLORIDE 0.9 % IR SOLN
Status: DC | PRN
  Administered 2012-09-18: 1000 mL

## 2012-09-18 MED ORDER — FENTANYL CITRATE 0.05 MG/ML IJ SOLN
INTRAMUSCULAR | Status: DC | PRN
  Administered 2012-09-18: 100 ug via INTRAVENOUS
  Administered 2012-09-18 (×2): 50 ug via INTRAVENOUS
  Administered 2012-09-18: 100 ug via INTRAVENOUS

## 2012-09-18 MED ORDER — OXYCODONE HCL 5 MG PO TABS: 5.0000 mg | ORAL_TABLET | ORAL | Status: DC | PRN

## 2012-09-18 MED ORDER — ACETAMINOPHEN 650 MG RE SUPP: 650.0000 mg | Freq: Four times a day (QID) | RECTAL | Status: DC | PRN

## 2012-09-18 MED ORDER — HYDROMORPHONE HCL PF 1 MG/ML IJ SOLN
0.2500 mg | INTRAMUSCULAR | Status: DC | PRN
  Administered 2012-09-18 (×3): 0.5 mg via INTRAVENOUS

## 2012-09-18 MED ORDER — BISACODYL 5 MG PO TBEC: 5.0000 mg | DELAYED_RELEASE_TABLET | Freq: Every day | ORAL | Status: DC | PRN

## 2012-09-18 MED ORDER — CEFAZOLIN SODIUM-DEXTROSE 2-3 GM-% IV SOLR
2.0000 g | INTRAVENOUS | Status: AC
  Administered 2012-09-18: 2 g via INTRAVENOUS
  Filled 2012-09-18: qty 50

## 2012-09-18 MED ORDER — ONDANSETRON HCL 4 MG/2ML IJ SOLN: 4.0000 mg | Freq: Four times a day (QID) | INTRAMUSCULAR | Status: DC | PRN

## 2012-09-18 MED ORDER — OXYCODONE HCL 5 MG PO TABS: 5.0000 mg | ORAL_TABLET | Freq: Once | ORAL | Status: DC | PRN

## 2012-09-18 MED ORDER — HYDROMORPHONE HCL PF 1 MG/ML IJ SOLN
1.0000 mg | INTRAMUSCULAR | Status: DC | PRN
  Administered 2012-09-18 – 2012-09-20 (×11): 1 mg via INTRAVENOUS
  Filled 2012-09-18 (×11): qty 1

## 2012-09-18 MED ORDER — PROMETHAZINE HCL 25 MG/ML IJ SOLN: 6.2500 mg | INTRAMUSCULAR | Status: DC | PRN

## 2012-09-18 MED ORDER — ACETAMINOPHEN 10 MG/ML IV SOLN
1000.0000 mg | Freq: Four times a day (QID) | INTRAVENOUS | Status: DC
  Filled 2012-09-18 (×3): qty 100

## 2012-09-18 MED ORDER — MAGNESIUM HYDROXIDE 400 MG/5ML PO SUSP: 30.0000 mL | Freq: Every day | ORAL | Status: DC | PRN

## 2012-09-18 MED ORDER — FENTANYL CITRATE 0.05 MG/ML IJ SOLN: 50.0000 ug | Freq: Once | INTRAMUSCULAR | Status: DC

## 2012-09-18 MED ORDER — GLYCOPYRROLATE 0.2 MG/ML IJ SOLN
INTRAMUSCULAR | Status: DC | PRN
  Administered 2012-09-18: .6 mg via INTRAVENOUS

## 2012-09-18 SURGICAL SUPPLY — 56 items
BLADE SAW SAG 73X25 THK (BLADE) ×1
BLADE SAW SGTL 18X1.27X75 (BLADE) IMPLANT
BLADE SAW SGTL 73X25 THK (BLADE) ×1 IMPLANT
BLADE SAW SGTL MED 73X18.5 STR (BLADE) IMPLANT
BRUSH FEMORAL CANAL (MISCELLANEOUS) IMPLANT
CLOTH BEACON ORANGE TIMEOUT ST (SAFETY) ×2 IMPLANT
COVER BACK TABLE 24X17X13 BIG (DRAPES) IMPLANT
COVER SURGICAL LIGHT HANDLE (MISCELLANEOUS) ×2 IMPLANT
DRAPE ORTHO SPLIT 77X108 STRL (DRAPES) ×1
DRAPE PROXIMA HALF (DRAPES) ×2 IMPLANT
DRAPE SURG ORHT 6 SPLT 77X108 (DRAPES) ×1 IMPLANT
DRAPE U-SHAPE 47X51 STRL (DRAPES) ×2 IMPLANT
DRILL BIT 7/64X5 (BIT) IMPLANT
DRSG MEPILEX BORDER 4X12 (GAUZE/BANDAGES/DRESSINGS) IMPLANT
DRSG MEPILEX BORDER 4X8 (GAUZE/BANDAGES/DRESSINGS) IMPLANT
DURAPREP 26ML APPLICATOR (WOUND CARE) ×2 IMPLANT
ELECT BLADE 4.0 EZ CLEAN MEGAD (MISCELLANEOUS) ×2
ELECT REM PT RETURN 9FT ADLT (ELECTROSURGICAL) ×2
ELECTRODE BLDE 4.0 EZ CLN MEGD (MISCELLANEOUS) ×1 IMPLANT
ELECTRODE REM PT RTRN 9FT ADLT (ELECTROSURGICAL) ×1 IMPLANT
GAUZE XEROFORM 1X8 LF (GAUZE/BANDAGES/DRESSINGS) IMPLANT
GLOVE BIO SURGEON STRL SZ7 (GLOVE) ×2 IMPLANT
GLOVE BIO SURGEON STRL SZ7.5 (GLOVE) ×2 IMPLANT
GLOVE BIOGEL PI IND STRL 7.0 (GLOVE) ×2 IMPLANT
GLOVE BIOGEL PI IND STRL 8 (GLOVE) ×1 IMPLANT
GLOVE BIOGEL PI INDICATOR 7.0 (GLOVE) ×2
GLOVE BIOGEL PI INDICATOR 8 (GLOVE) ×1
GLOVE SURG SS PI 6.5 STRL IVOR (GLOVE) ×2 IMPLANT
GOWN PREVENTION PLUS XLARGE (GOWN DISPOSABLE) ×2 IMPLANT
GOWN PREVENTION PLUS XXLARGE (GOWN DISPOSABLE) ×2 IMPLANT
GOWN STRL NON-REIN LRG LVL3 (GOWN DISPOSABLE) ×2 IMPLANT
GOWN STRL REIN XL XLG (GOWN DISPOSABLE) ×2 IMPLANT
HANDPIECE INTERPULSE COAX TIP (DISPOSABLE)
HOOD PEEL AWAY FACE SHEILD DIS (HOOD) ×4 IMPLANT
KIT BASIN OR (CUSTOM PROCEDURE TRAY) ×2 IMPLANT
KIT ROOM TURNOVER OR (KITS) ×2 IMPLANT
MANIFOLD NEPTUNE II (INSTRUMENTS) ×2 IMPLANT
NEEDLE 22X1 1/2 (OR ONLY) (NEEDLE) ×2 IMPLANT
NS IRRIG 1000ML POUR BTL (IV SOLUTION) ×2 IMPLANT
PACK TOTAL JOINT (CUSTOM PROCEDURE TRAY) ×2 IMPLANT
PAD ARMBOARD 7.5X6 YLW CONV (MISCELLANEOUS) ×4 IMPLANT
PASSER SUT SWANSON 36MM LOOP (INSTRUMENTS) ×2 IMPLANT
PRESSURIZER FEMORAL UNIV (MISCELLANEOUS) IMPLANT
SET HNDPC FAN SPRY TIP SCT (DISPOSABLE) IMPLANT
SUT ETHIBOND 2 V 37 (SUTURE) ×2 IMPLANT
SUT ETHILON 3 0 FSL (SUTURE) ×2 IMPLANT
SUT VIC AB 0 CTB1 27 (SUTURE) ×2 IMPLANT
SUT VIC AB 1 CTX 36 (SUTURE) ×1
SUT VIC AB 1 CTX36XBRD ANBCTR (SUTURE) ×1 IMPLANT
SUT VIC AB 2-0 CTB1 (SUTURE) ×2 IMPLANT
SYR CONTROL 10ML LL (SYRINGE) ×2 IMPLANT
TOWEL OR 17X24 6PK STRL BLUE (TOWEL DISPOSABLE) ×2 IMPLANT
TOWEL OR 17X26 10 PK STRL BLUE (TOWEL DISPOSABLE) ×2 IMPLANT
TOWER CARTRIDGE SMART MIX (DISPOSABLE) IMPLANT
TRAY FOLEY CATH 14FR (SET/KITS/TRAYS/PACK) ×2 IMPLANT
WATER STERILE IRR 1000ML POUR (IV SOLUTION) ×4 IMPLANT

## 2012-09-18 NOTE — Anesthesia Preprocedure Evaluation (Signed)
Anesthesia Evaluation  Patient identified by MRN, date of birth, ID band Patient awake    Reviewed: Allergy & Precautions, H&P , NPO status , Patient's Chart, lab work & pertinent test results  History of Anesthesia Complications Negative for: history of anesthetic complications  Airway Mallampati: II TM Distance: >3 FB Neck ROM: Full    Dental No notable dental hx. (+) Teeth Intact and Dental Advisory Given   Pulmonary neg pulmonary ROS,  breath sounds clear to auscultation  Pulmonary exam normal       Cardiovascular hypertension, negative cardio ROS  Rhythm:regular Rate:Normal     Neuro/Psych negative neurological ROS  negative psych ROS   GI/Hepatic negative GI ROS, Neg liver ROS, GERD-  Controlled,Elevation of LFTs when was septic with this cellulitis   Endo/Other  negative endocrine ROS  Renal/GU negative Renal ROS  negative genitourinary   Musculoskeletal negative musculoskeletal ROS (+)   Abdominal   Peds  Hematology negative hematology ROS (+)   Anesthesia Other Findings   Reproductive/Obstetrics                           Anesthesia Physical Anesthesia Plan  ASA: II  Anesthesia Plan: General   Post-op Pain Management:    Induction: Intravenous  Airway Management Planned: Oral ETT  Additional Equipment:   Intra-op Plan:   Post-operative Plan: Extubation in OR  Informed Consent: I have reviewed the patients History and Physical, chart, labs and discussed the procedure including the risks, benefits and alternatives for the proposed anesthesia with the patient or authorized representative who has indicated his/her understanding and acceptance.     Plan Discussed with: CRNA and Surgeon  Anesthesia Plan Comments:         Anesthesia Quick Evaluation

## 2012-09-18 NOTE — Progress Notes (Signed)
I discussed with  Dr Bradshaw.  I agree with their plans documented in their progress note for today.  

## 2012-09-18 NOTE — Progress Notes (Signed)
Family Medicine Teaching Service Daily Progress Note Service Page: [phone]  Patient Assessment: 51 y/o male with PMH of GAS bacteremia with multiple infective foci here with L hip pain for 1 week.   Subjective:  No acute events overnight. Pain helped by meds but no improvement. Denies nausea and vomiting. Pain increased with physical activity. Apart from his hip pain he feels he could easily walk 2 miles briskly.   Objective: Temp:  [97.7 F (36.5 C)-98.7 F (37.1 C)] 98.7 F (37.1 C) (09/25 0610) Pulse Rate:  [67-80] 67  (09/25 0610) Resp:  [18] 18  (09/25 0610) BP: (141-149)/(91-99) 147/99 mmHg (09/25 0610) SpO2:  [99 %-100 %] 100 % (09/25 0610) Exam: Gen: NAD, alert, cooperative with exam  HEENT: NCAT, EOMI, PERRL, oral mucosa moist and pink  CV: RRR, no murmur  Resp: CTABL, no wheezes, non-labored  Abd: SNTND, BS present, no guarding or organomegaly  Ext: No edema, very tender to palpation in the left inguinal area and left lateral hip area. significant pain with passive and active movement. Active ROM improved overnight, no erythema of L hip or inguinal area. No inguinal LAD Neuro: Alert and oriented, No gross deficits  I have reviewed the patient's medications, labs, imaging, and diagnostic testing.  Notable results are summarized below.  CBC BMET   Lab 09/18/12 0541 09/17/12 0545 09/16/12 0500  WBC 4.1 5.2 6.4  HGB 13.3 14.5 14.0  HCT 41.0 44.3 43.2  PLT 175 216 218    Lab 09/15/12 1812 09/15/12 1459  NA -- 138  K -- 4.3  CL -- 101  CO2 -- 25  BUN -- 7  CREATININE 0.88 0.87  GLUCOSE -- 86  CALCIUM -- 10.0    serum uric acid : 6.9 Cell count from joint aspirate: WBC and no crystals Joint aspirate culture: NGTD  Imaging/Diagnostic Tests: DG Wrist complete 09/15/2012  IMPRESSION:  No acute fracture or subluxation. There is narrowing of radiocarpal joint space. Soft tissue swelling is noted dorsal carpal region. Tiny bony fragment dorsal aspect of the  lunate appears well corticated probable due to prior injury. Mild degenerative changes carpal region.  DG L hip 09/15/2012  IMPRESSION:  Marked asymmetric destruction of the left hip joint, likely progressed from abdominal CT performed 03/2012. Correlation for signs and symptoms of septic arthritis is recommended.    Plan: [name] is a 51 y.o. year old male with PMH of GAS bacteremia with multiple infective foci presenting with L hip and L wrist pain and subsequent difficulty walking for 1 week.  1. 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  1. Cell count with 0 WBC and no crystals, culture NGTD 2. Plan to dc Empiric IV vanc and Zosyn 3. Pain control with scheduled tylenol and oxycodone 4. Uric acid level 6.8 5. Ortho consulted- recommendations much appreciated! 1. Planning replacement today at 11 am.   2. HTN: No current antihypertensives but was treated during last admission  1. Average still around 150/100 2. Started lisinopril 10 yesterday, will increase to 20 if it continues to be high  3. FEN/GI: Regular diet 4. Prophylaxis: Lovenox  5. Disposition: Observation, home pending studies and symptom improvement and lab results. 6. Code Status: Full for now, will clarify   [name] Fenton, MD 09/18/2012, 8:41 AM

## 2012-09-18 NOTE — Anesthesia Procedure Notes (Signed)
Procedure Name: Intubation Date/Time: 09/18/2012 11:32 AM Performed by: Wray Kearns A Pre-anesthesia Checklist: Patient identified, Timeout performed, Emergency Drugs available, Suction available and Patient being monitored Patient Re-evaluated:Patient Re-evaluated prior to inductionOxygen Delivery Method: Circle system utilized Preoxygenation: Pre-oxygenation with 100% oxygen Intubation Type: IV induction and Cricoid Pressure applied Ventilation: Mask ventilation without difficulty Laryngoscope Size: Miller and 2 Grade View: Grade I Tube type: Oral Number of attempts: 1 Airway Equipment and Method: Stylet Placement Confirmation: ETT inserted through vocal cords under direct vision and breath sounds checked- equal and bilateral Secured at: 23 cm Tube secured with: Tape Dental Injury: Teeth and Oropharynx as per pre-operative assessment

## 2012-09-18 NOTE — Interval H&P Note (Signed)
History and Physical Interval Note:  09/18/2012 10:57 AM  [name]  has presented today for surgery, with the diagnosis of l hip pain  The various methods of treatment have been discussed with the patient and family. After consideration of risks, benefits and other options for treatment, the patient has consented to  Procedure(s) (LRB) with comments: TOTAL HIP ARTHROPLASTY (Left) as a surgical intervention .  The patient's history has been reviewed, patient examined, no change in status, stable for surgery.  I have reviewed the patient's chart and labs.  Questions were answered to the patient's satisfaction.     Nestor Lewandowsky

## 2012-09-18 NOTE — Progress Notes (Signed)
PT Cancellation Note  Treatment cancelled today due to medical issues with patient which prohibited therapy, pt having THA today.  Will f/u with therapy tomorrow.  Luzelena Heeg, [place] 09/18/2012, 3:57 PM

## 2012-09-18 NOTE — Progress Notes (Signed)
Orthopedic Tech Progress Note Patient Details:  [name] Sep 20, 1961 [id number] trapeze bar     Cammer, Mickie Bail 09/18/2012, 5:00 PM

## 2012-09-18 NOTE — Op Note (Signed)
OPERATIVE REPORT    DATE OF PROCEDURE:  09/18/2012       PREOPERATIVE DIAGNOSIS:  end stage arthritis of left hip                                                          POSTOPERATIVE DIAGNOSIS:  end stage arthritis of left hip                                                           PROCEDURE:  L total hip arthroplasty using a 52 mm DePuy Pinnacle  Cup, [employer], 10-degree polyethylene liner index superior  and posterior, a +0 36 mm ceramic head, a 16x11x36x150 SROM stem, 16Dsm Cone   SURGEON: Essa Malachi J    ASSISTANT:   Mauricia Area, PA-C  (present throughout entire procedure and necessary for timely completion of the procedure)   ANESTHESIA: General BLOOD LOSS: 300 FLUID REPLACEMENT: 1600 crystalloid DRAINS: Foley Catheter URINE OUTPUT: 300cc COMPLICATIONS: none    INDICATIONS FOR PROCEDURE: A 51 y.o. year-old With  end stage arthritis of left hip   for 1 year, x-rays show bone-on-bone arthritic changes, CT shows extensive subchondral cysts, bony erosions. This is a rapid progression from x-rays in April of 2013 showed bone-on-bone arthritis of little destruction of the femoral head. Despite conservative measures with observation, anti-inflammatory medicine, narcotics, use of a cane, has severe unremitting pain and can ambulate only a few blocks before resting.  Patient desires elective L total hip arthroplasty to decrease pain and increase function. The risks, benefits, and alternatives were discussed at length including but not limited to the risks of infection, bleeding, nerve injury, stiffness, blood clots, the need for revision surgery, cardiopulmonary complications, among others, and they were willing to proceed.y have been discussed. Questions answered.     PROCEDURE IN DETAIL: The patient was identified by armband,  received preoperative IV antibiotics in the holding area at [facility], taken to the operating room , appropriate anesthetic monitors    were attached and general endotracheal anesthesia induced. Foley catheter was inserted. Pt was rolled into the R lateral decubitus position and fixed there with a Stulberg Mark II pelvic clamp and the L lower extremity was then prepped and draped  in the usual sterile fashion from the ankle to the hemipelvis. A time-out  procedure was performed. The skin along the lateral hip and thigh  infiltrated with 10 mL of 0.5% Marcaine and epinephrine solution. We  then made a posterolateral approach to the hip. With a #10 blade, 15 cm  incision through skin and subcutaneous tissue down to the level of the  IT band. Small bleeders were identified and cauterized. IT band cut in  line with skin incision exposing the greater trochanter. A Cobra retractor was placed between the gluteus minimus and the superior hip joint capsule, and a spiked Cobra between the quadratus femoris and the inferior hip joint capsule. This isolated the short  external rotators and piriformis tendons. These were tagged with a #2 Ethibond  suture and cut off their insertion on the intertrochanteric crest. The posterior  capsule was  then developed into an acetabular-based flap from Posterior Superior off of the acetabulum out over the femoral neck and back posterior inferior to the acetabular rim. This flap was tagged with two #2 Ethibond sutures and retracted protecting the sciatic nerve. This exposed the arthritic femoral head and osteophytes. The hip was then flexed and internally rotated, dislocating the femoral head and a standard neck cut performed 1 fingerbreadth above the lesser trochanter.  A spiked Cobra was placed in the cotyloid notch and a Hohmann retractor was then used to lever the femur anteriorly off of the anterior pelvic column. A posterior-inferior wing retractor was placed at the junction of the acetabulum and the ischium completing the acetabular exposure.We then removed the peripheral osteophytes and labrum from the  acetabulum. We then reamed the acetabulum up to 51 mm with basket reamers obtaining good coverage in all quadrants, irrigated out with normal  saline solution and hammered into place a 52 mm pinnacle cup in 45  degrees of abduction and about 20 degrees of anteversion. More  peripheral osteophytes removed and a trial 10-degree liner placed with the  index superior-posterior. The hip was then flexed and internally rotated exposing the  proximal femur, which was entered with the initiating reamer followed by  the axial reamers up to a 11.5 mm full depth and 12mm partial depth. We then conically reamed to 16D to the correct depth for a 36 base neck. The calcar was milled to 16Dsm. A trial cone and stem was inserted in the 25 degrees anteversion, with a +0 36mm trial head. Trial reduction was then performed and excellent stability was noted with at 90 of flexion with 70 of internal rotation and then full extension with maximal external rotation. The hip could not be dislocated in full extension. The knee could easily flex  to about 130 degrees. We also stretched the abductors at this point,  because of the preexisting adductor contractures. All trial components  were then removed. The acetabulum was irrigated out with normal saline  solution. A titanium [facility] was then screwed into place  followed by a 10-degree polyethylene liner index superior-posterior. On  the femoral side a 16Dsm ZTT1 cone was hammered into place, followed by a [id number] SROM stem in 25 degrees of anteversion. At this point, a +0 36 mm ceramic head was  hammered on the stem. The hip was reduced. We checked our stability  one more time and found to be excellent. The wound was once again  thoroughly irrigated out with normal saline solution pulse lavage. The  capsular flap and short external rotators were repaired back to the  intertrochanteric crest through drill holes with a #2 Ethibond suture.  The IT band was  closed with running 1 Vicryl suture. The subcutaneous  tissue with 0 and 2-0 undyed Vicryl suture and the skin with running  interlocking 3-0 nylon suture. Dressing of Xeroform and Mepilex was  then applied. The patient was then unclamped, rolled supine, awaken extubated and taken to recovery room without difficulty in stable condition.   Nestor Lewandowsky 09/18/2012, 12:35 PM

## 2012-09-18 NOTE — Preoperative (Signed)
Beta Blockers   Reason not to administer Beta Blockers:Not Applicable 

## 2012-09-19 ENCOUNTER — Encounter (HOSPITAL_COMMUNITY): Payer: Self-pay | Admitting: Orthopedic Surgery

## 2012-09-19 LAB — CBC
MCV: 66.5 fL — ABNORMAL LOW (ref 78.0–100.0)
Platelets: 127 10*3/uL — ABNORMAL LOW (ref 150–400)
RBC: 4.27 MIL/uL (ref 4.22–5.81)
RDW: 19.9 % — ABNORMAL HIGH (ref 11.5–15.5)
WBC: 8.3 10*3/uL (ref 4.0–10.5)

## 2012-09-19 LAB — BASIC METABOLIC PANEL
CO2: 20 mEq/L (ref 19–32)
Chloride: 97 mEq/L (ref 96–112)
GFR calc Af Amer: >90 mL/min (ref >90)
Potassium: 4.2 mEq/L (ref 3.5–5.1)
Sodium: 133 mEq/L — ABNORMAL LOW (ref 135–145)

## 2012-09-19 MED ORDER — ASPIRIN 325 MG PO TABS
325.0000 mg | ORAL_TABLET | Freq: Every day | ORAL | Status: DC
  Administered 2012-09-19 – 2012-09-21 (×3): 325 mg via ORAL
  Filled 2012-09-19 (×3): qty 1

## 2012-09-19 NOTE — Progress Notes (Signed)
Physical Therapy Treatment Patient Details Name: ROMYN ABEYTA MRN: [id number] DOB: 07-May-1961 Today's Date: 09/19/2012 Time: 9562-1308 PT Time Calculation (min): 34 min  PT Assessment / Plan / Recommendation Comments on Treatment Session  Patient tolerated treatment well. Was able to increase ambulation distance and perform ther-ex with minimal VC's for technique and instruction.    Follow Up Recommendations  Home health PT    Barriers to Discharge        Equipment Recommendations  None recommended by PT    Recommendations for Other Services OT consult  Frequency 7X/week   Plan Discharge plan remains appropriate    Precautions / Restrictions Precautions Precautions: Posterior Hip;Fall Precaution Booklet Issued: Yes (comment) Restrictions Weight Bearing Restrictions: Yes   Pertinent Vitals/Pain 7/10    Mobility  Bed Mobility Bed Mobility: Not assessed Transfers Transfers: Sit to Stand;Stand to Sit Sit to Stand: 5: Supervision;With armrests;From chair/3-in-1 Stand to Sit: 5: Supervision;With armrests;To chair/3-in-1 Ambulation/Gait Ambulation/Gait Assistance: 5: Supervision Ambulation Distance (Feet): 200 Feet Assistive device: Rolling walker Gait Pattern: Step-through pattern;Decreased stride length;Decreased stance time - left;Decreased step length - right Gait velocity: decreased Stairs: No    Exercises Total Joint Exercises Ankle Circles/Pumps: AROM;Both;10 reps Quad Sets: Strengthening;Left;10 reps Hip ABduction/ADduction: Strengthening;Left;10 reps Marching in Standing: Strengthening;Left;10 reps Standing Hip Extension: Strengthening;Left;10 reps General Exercises - Lower Extremity Mini-Sqauts: Strengthening;Left;10 reps    PT Goals Acute Rehab PT Goals PT Goal Formulation: With patient Time For Goal Achievement: 09/24/12 Potential to Achieve Goals: Good Pt will go Sit to Stand: with supervision PT Goal: Sit to Stand - Progress: Progressing toward  goal Pt will go Stand to Sit: with supervision PT Goal: Stand to Sit - Progress: Progressing toward goal Pt will Ambulate: >150 feet;with supervision;with least restrictive assistive device Pt will Go Up / Down Stairs: Flight;with supervision;with least restrictive assistive device;with rail(s) PT Goal: Perform Home Exercise Program - Progress: Progressing toward goal  Visit Information  Last PT Received On: 09/19/12 Assistance Needed: +1    Subjective Data  Subjective: I just have a constant pain Patient Stated Goal: to go home with family   Cognition  Overall Cognitive Status: Appears within functional limits for tasks assessed/performed Arousal/Alertness: Awake/alert Orientation Level: Appears intact for tasks assessed Behavior During Session: [facility] for tasks performed    Balance     End of Session PT - End of Session Equipment Utilized During Treatment: Gait belt Activity Tolerance: Patient tolerated treatment well Patient left: in chair;with call bell/phone within reach Nurse Communication: Mobility status   GP     Fabio Asa 09/19/2012, 3:31 PM Charlotte Crumb, PT DPT  [phone]

## 2012-09-19 NOTE — Progress Notes (Signed)
Family Medicine Teaching Service Daily Progress Note Service Page: [phone]  Patient Assessment: 51 y/o male with PMH of GAS bacteremia with multiple infective foci here with L hip pain for 1 week.   Subjective:  No acute events overnight. Pain improved since hip replacement and well controlled on current regimen. No nausea, vomiting, or diarrhea. No new complaints. Hs moved around in bed with the trapeze bar but has not been up walking yet.   Objective: Temp:  [97.2 F (36.2 C)-98.2 F (36.8 C)] 98.2 F (36.8 C) (09/26 0658) Pulse Rate:  [52-86] 86  (09/26 0658) Resp:  [9-23] 18  (09/26 0658) BP: (116-150)/(70-104) 134/86 mmHg (09/26 0658) SpO2:  [99 %-100 %] 99 % (09/26 0658) Exam: Gen: NAD, alert, cooperative with exam  HEENT: NCAT, EOMI, PERRL, oral mucosa moist and pink  CV: RRR, no murmur  Resp: CTABL, no wheezes, non-labored  Abd: SNTND, BS present, no guarding or organomegaly  Ext: No edema, very tender to palpation in the left inguinal area and left lateral hip area. significant pain with passive and active movement. Active ROM improved overnight, no erythema of L hip or inguinal area. No inguinal LAD Neuro: Alert and oriented, No gross deficits  I have reviewed the patient's medications, labs, imaging, and diagnostic testing.  Notable results are summarized below.  CBC BMET   Lab 09/18/12 0541 09/17/12 0545 09/16/12 0500  WBC 4.1 5.2 6.4  HGB 13.3 14.5 14.0  HCT 41.0 44.3 43.2  PLT 175 216 218    Lab 09/15/12 1812 09/15/12 1459  NA -- 138  K -- 4.3  CL -- 101  CO2 -- 25  BUN -- 7  CREATININE 0.88 0.87  GLUCOSE -- 86  CALCIUM -- 10.0    serum uric acid : 6.9 Cell count from joint aspirate: WBC and no crystals Joint aspirate culture: NGTD  Imaging/Diagnostic Tests: DG Wrist complete 09/15/2012  IMPRESSION:  No acute fracture or subluxation. There is narrowing of radiocarpal joint space. Soft tissue swelling is noted dorsal carpal region. Tiny bony  fragment dorsal aspect of the lunate appears well corticated probable due to prior injury. Mild degenerative changes carpal region.  DG L hip 09/15/2012  IMPRESSION:  Marked asymmetric destruction of the left hip joint, likely progressed from abdominal CT performed 03/2012. Correlation for signs and symptoms of septic arthritis is recommended.    Plan: [name] is a 51 y.o. year old male with PMH of GAS bacteremia with multiple infective foci presenting with L hip and L wrist pain and subsequent difficulty walking for 1 week.  1. 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 S/p total hip arthroplasty day 1.  1. Cell count with 0 WBC and no crystals, culture NGTD, Uric acid level 6.8 2. Empiric IV vanc and Zosyn for 2 days while septic arthritis was ruled out, now dc'd 3. Pain control with scheduled tylenol and q2 dilaudid 4. Ortho consulted- recommendations much appreciated!   - s/p THA day 1 5. Hgb dropped to 9.1 from 13.3 but only 300 ml lost during surgery. Will monitor daily  2. HTN: No current antihypertensives but was treated during last admission  1. Average around 130/80  2. Lisinopril 10 qd, will monitor  3. FEN/GI: Regular diet 4. Prophylaxis: Aspirin and SCDs 5. Disposition: Observation, home after recovery from surgery 6. Code Status: Full   [name], MD 09/19/2012, 7:09 AM

## 2012-09-19 NOTE — Progress Notes (Signed)
I discussed with  Dr Bradshaw.  I agree with their plans documented in their progress note for today.  

## 2012-09-19 NOTE — Progress Notes (Signed)
CARE MANAGEMENT NOTE 09/19/2012  Patient:  [name], [name]   Account Number:  [id number]  Date Initiated:  09/19/2012  Documentation initiated by:  Anette Guarneri  Subjective/Objective Assessment:   left hip pain  9/25 left THA  HH services, DME     Action/Plan:   home with [facility] services. CM spoke with patient regarding HH needs. Has rolling walker and 3in1. Has family support.   Anticipated DC Date:  09/21/2012   Anticipated DC Plan:  HOME W HOME HEALTH SERVICES      DC Planning Services  CM consult      Choice offered to / List presented to:  C-1 Patient        HH arranged  HH-1 RN  HH-2 PT      [facility] agency  [facility]   Status of service:  Completed, signed off Medicare Important Message given?   (If response is "NO", the following Medicare IM given date fields will be blank) Date Medicare IM given:   Date Additional Medicare IM given:    Discharge Disposition:  HOME W HOME HEALTH SERVICES  Per UR Regulation:  Reviewed for med. necessity/level of care/duration of stay  If discussed at Long Length of Stay Meetings, dates discussed:    Comments:

## 2012-09-19 NOTE — Progress Notes (Signed)
Patient ID: [name], male   DOB: Jun 14, 1961, 51 y.o.   MRN: [id number] PATIENT ID: [name]  MRN: [id number]  DOB/AGE:  1961/05/14 / 51 y.o.  1 Day Post-Op Procedure(s) (LRB): TOTAL HIP ARTHROPLASTY (Left)    PROGRESS NOTE Subjective: Patient is alert, oriented,no Nausea, no Vomiting, yes passing gas, no Bowel Movement. Taking PO well. Denies SOB, Chest or Calf Pain. Using Incentive Spirometer, PAS in place. Ambulate WBAT, patient actually still up at the bedside last night. Pain is significantly diminished compared to preop. Patient reports pain as 3 on 0-10 scale  .    Objective: Vital signs in last 24 hours: Filed Vitals:   09/18/12 2241 09/18/12 2359 09/19/12 0658 09/19/12 0800  BP: 132/83  134/86   Pulse: 84  86   Temp: 98.2 F (36.8 C)  98.2 F (36.8 C)   TempSrc:      Resp: 18 18 18 18   Height:      Weight:      SpO2: 100% 100% 99%       Intake/Output from previous day: I/O last 3 completed shifts: In: 2125 [I.V.:1850; Other:75; IV Piggyback:200] Out: 3375 [Urine:3225; Blood:150]   Intake/Output this shift:     LABORATORY DATA:  Basename 09/19/12 0610 09/18/12 0541  WBC 8.3 4.1  HGB 9.1* 13.3  HCT 28.4* 41.0  PLT 127* 175  NA 133* --  K 4.2 --  CL 97 --  CO2 20 --  BUN 7 --  CREATININE 0.84 --  GLUCOSE 126* --  GLUCAP -- --  INR -- --  CALCIUM 9.2 --    Examination: Neurologically intact ABD soft Neurovascular intact Sensation intact distally Intact pulses distally Dorsiflexion/Plantar flexion intact Incision: scant drainage No cellulitis present Compartment soft} XR AP&Lat of hip shows well placed\fixed THA  Assessment:   1 Day Post-Op Procedure(s) (LRB): TOTAL HIP ARTHROPLASTY (Left)  Plan: PT/OT WBAT, THA  posterior precautions  DVT Prophylaxis: SCDx72 hrs,Lovenox for 2 weeks  DISCHARGE PLAN: Home, patient has good support at home with his wife, he should be able to go home in one or 2 days when he passes physical  therapy. He will need to followup with me 10-14 days after surgery for suture removal.  DISCHARGE NEEDS: HHPT, HHRN, CPM, Walker and 3-in-1 comode seat

## 2012-09-19 NOTE — Progress Notes (Signed)
Utilization review completed. Royale Lennartz, RN, BSN. 

## 2012-09-19 NOTE — Progress Notes (Signed)
Physical Therapy Treatment Patient Details Name: [name] MRN: [id number] DOB: 01-05-1961 Today's Date: 09/19/2012 Time: 4098-1191 PT Time Calculation (min): 32 min  PT Assessment / Plan / Recommendation Comments on Treatment Session       Follow Up Recommendations  Home health PT    Barriers to Discharge        Equipment Recommendations  None recommended by PT    Recommendations for Other Services OT consult  Frequency 7X/week   Plan      Precautions / Restrictions Precautions Precautions: Posterior Hip;Fall Precaution Booklet Issued: Yes (comment) Restrictions Weight Bearing Restrictions: Yes   Pertinent Vitals/Pain 6/10    Mobility  Bed Mobility Bed Mobility: Supine to Sit;Sitting - Scoot to Edge of Bed Supine to Sit: 5: Supervision;HOB elevated Sitting - Scoot to Edge of Bed: 5: Supervision Transfers Transfers: Sit to Stand;Stand to Sit Sit to Stand: 4: Min guard;From bed Stand to Sit: 4: Min guard;With upper extremity assist;With armrests;To chair/3-in-1 Details for Transfer Assistance: VC's for hand placement and body positioning Ambulation/Gait Ambulation/Gait Assistance: 4: Min guard Ambulation Distance (Feet): 100 Feet Assistive device: Rolling walker Ambulation/Gait Assistance Details: VCs for sequencing and turns with precautions Gait Pattern: Step-through pattern;Decreased stride length;Decreased stance time - left;Decreased step length - right Gait velocity: decreased Stairs: No    Exercises     PT Diagnosis: Difficulty walking;Generalized weakness;Acute pain  PT Problem List: Decreased strength;Decreased range of motion;Decreased activity tolerance;Decreased mobility;Pain;Decreased knowledge of use of DME PT Treatment Interventions: DME instruction;Gait training;Stair training;Functional mobility training;Therapeutic activities;Therapeutic exercise;Balance training;Patient/family education   PT Goals Acute Rehab PT Goals PT Goal  Formulation: With patient Time For Goal Achievement: 09/24/12 Potential to Achieve Goals: Good Pt will go Sit to Stand: with supervision PT Goal: Sit to Stand - Progress: Goal set today Pt will go Stand to Sit: with supervision PT Goal: Stand to Sit - Progress: Goal set today Pt will Ambulate: >150 feet;with supervision;with least restrictive assistive device PT Goal: Ambulate - Progress: Goal set today Pt will Go Up / Down Stairs: Flight;with supervision;with least restrictive assistive device;with rail(s)  Visit Information  Last PT Received On: 09/19/12 Assistance Needed: +1    Subjective Data  Subjective: I just have a constant pain Patient Stated Goal: to go home with family   Cognition  Overall Cognitive Status: Appears within functional limits for tasks assessed/performed Arousal/Alertness: Awake/alert Orientation Level: Appears intact for tasks assessed Behavior During Session: [facility] for tasks performed    Balance     End of Session PT - End of Session Equipment Utilized During Treatment: Gait belt Activity Tolerance: Patient limited by pain Patient left: in chair;with call bell/phone within reach Nurse Communication: Mobility status   GP     Fabio Asa 09/19/2012, 11:28 AM

## 2012-09-19 NOTE — Transfer of Care (Signed)
Immediate Anesthesia Transfer of Care Note  Patient: [name]  Procedure(s) Performed: Procedure(s) (LRB) with comments: TOTAL HIP ARTHROPLASTY (Left) - left total hip arthroplasty  Patient Location: PACU  Anesthesia Type: General  Level of Consciousness: oriented, sedated, patient cooperative and responds to stimulation  Airway & Oxygen Therapy: Patient Spontanous Breathing and Patient connected to nasal cannula oxygen  Post-op Assessment: Report given to PACU RN, Post -op Vital signs reviewed and stable, Patient moving all extremities and Patient moving all extremities X 4  Post vital signs: Reviewed and stable  Complications: No apparent anesthesia complications

## 2012-09-19 NOTE — Progress Notes (Signed)
Clinical Child psychotherapist (CSW) received an inappropriate referral for SNF placement. Pt will dc home with HH.  Please reconsult if any CSW needs arise.  Theresia Bough, MSW, Theresia Majors [phone]

## 2012-09-20 LAB — BASIC METABOLIC PANEL
Chloride: 97 mEq/L (ref 96–112)
GFR calc Af Amer: >90 mL/min (ref >90)
GFR calc non Af Amer: >90 mL/min (ref >90)
Potassium: 3.7 mEq/L (ref 3.5–5.1)
Sodium: 134 mEq/L — ABNORMAL LOW (ref 135–145)

## 2012-09-20 LAB — CBC
HCT: 19.6 % — ABNORMAL LOW (ref 39.0–52.0)
Hemoglobin: 7.4 g/dL — ABNORMAL LOW (ref 13.0–17.0)
MCH: 23 pg — ABNORMAL LOW (ref 26.0–34.0)
MCHC: 32.1 g/dL (ref 30.0–36.0)
Platelets: 150 10*3/uL (ref 150–400)
Platelets: 143 10*3/uL — ABNORMAL LOW (ref 150–400)
RBC: 3.22 MIL/uL — ABNORMAL LOW (ref 4.22–5.81)
RDW: 19.2 % — ABNORMAL HIGH (ref 11.5–15.5)
WBC: 10 10*3/uL (ref 4.0–10.5)
WBC: 10.3 10*3/uL (ref 4.0–10.5)

## 2012-09-20 LAB — PREPARE RBC (CROSSMATCH)

## 2012-09-20 MED ORDER — OXYCODONE-ACETAMINOPHEN 5-325 MG PO TABS: 1.0000 | ORAL_TABLET | ORAL | Status: DC | PRN

## 2012-09-20 MED ORDER — OXYCODONE HCL 5 MG PO TABS: 10.0000 mg | ORAL_TABLET | ORAL | Status: DC | PRN

## 2012-09-20 MED ORDER — OXYCODONE HCL 10 MG PO TB12
10.0000 mg | ORAL_TABLET | Freq: Two times a day (BID) | ORAL | Status: DC
  Administered 2012-09-20 – 2012-09-21 (×2): 10 mg via ORAL
  Filled 2012-09-20 (×2): qty 1

## 2012-09-20 MED ORDER — OXYCODONE HCL 5 MG PO TABS
5.0000 mg | ORAL_TABLET | ORAL | Status: DC | PRN
  Administered 2012-09-20 – 2012-09-21 (×2): 10 mg via ORAL
  Administered 2012-09-21: 5 mg via ORAL
  Filled 2012-09-20 (×3): qty 2

## 2012-09-20 MED ORDER — OXYCODONE HCL 15 MG PO TB12: 15.0000 mg | ORAL_TABLET | Freq: Two times a day (BID) | ORAL | Status: DC

## 2012-09-20 NOTE — Progress Notes (Signed)
Family Medicine Teaching Service Daily Progress Note Service Page: [phone]  Patient Assessment: 51 y/o male with PMH of GAS bacteremia with multiple infective foci here with L hip pain for 1 week.   Subjective:  No acute events overnight. Pain not well controlled since ery morning hours. PAtient feeling a little weak and tired.  No nausea, vomiting, or diarrhea. Pain still better than his arthritic hip. walke dthe halls with PT yesterday.    Objective: Temp:  [98.2 F (36.8 C)-100 F (37.8 C)] 100 F (37.8 C) (09/27 0550) Pulse Rate:  [86-124] 87  (09/27 0550) Resp:  [18-21] 18  (09/27 0550) BP: (125-134)/(71-86) 126/75 mmHg (09/27 0550) SpO2:  [99 %-100 %] 100 % (09/27 0550) Exam: Gen: NAD, alert, cooperative with exam  HEENT: NCAT, EOMI, PERRL, oral mucosa moist and pink  CV: RRR, no murmur  Resp: CTABL, no wheezes, non-labored  Abd: SNTND, BS present, no guarding or organomegaly  Ext: No edema, bandage clean dry and intact on L hip.  Neuro: Alert and oriented, No gross deficits  I have reviewed the patient's medications, labs, imaging, and diagnostic testing.  Notable results are summarized below.  CBC BMET   Lab 09/19/12 0610 09/18/12 0541 09/17/12 0545  WBC 8.3 4.1 5.2  HGB 9.1* 13.3 14.5  HCT 28.4* 41.0 44.3  PLT 127* 175 216    Lab 09/19/12 0610 09/15/12 1812 09/15/12 1459  NA 133* -- 138  K 4.2 -- 4.3  CL 97 -- 101  CO2 20 -- 25  BUN 7 -- 7  CREATININE 0.84 0.88 0.87  GLUCOSE 126* -- 86  CALCIUM 9.2 -- 10.0    serum uric acid : 6.9 Cell count from joint aspirate: WBC and no crystals Joint aspirate culture: NGTD  Imaging/Diagnostic Tests: DG Wrist complete 09/15/2012  IMPRESSION:  No acute fracture or subluxation. There is narrowing of radiocarpal joint space. Soft tissue swelling is noted dorsal carpal region. Tiny bony fragment dorsal aspect of the lunate appears well corticated probable due to prior injury. Mild degenerative changes carpal  region.  DG L hip 09/15/2012  IMPRESSION:  Marked asymmetric destruction of the left hip joint, likely progressed from abdominal CT performed 03/2012. Correlation for signs and symptoms of septic arthritis is recommended.    Plan: [name] is a 51 y.o. year old male with PMH of GAS bacteremia with multiple infective foci presenting with L hip and L wrist pain and subsequent difficulty walking for 1 week.  1. 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 S/p total hip arthroplasty day 2.  Pain not well controlled since early morning hours.  1. Cell count with 0 WBC and no crystals, culture NGTD, Uric acid level 6.8 2. Empiric IV vanc and Zosyn for 2 days while septic arthritis was ruled out, now dc'd 3. Pain control with scheduled scheduled oxycontin and PRN dilaudid, robaxin, and oxycodone 1. Encourage patient to ask for PRNs, conisder increasing oxycontin 4. Ortho consulted- recommendations much appreciated!   - s/p THA day 2 5. Hgb dropped to 6.3 from 9.1 but only 300 ml lost during surgery. Will monitor daily  - will transfuse times 1 and recheck CBC after  2. HTN: No current antihypertensives but was treated during last admission  1. Average around 125-128/71-74 2. Lisinopril 10 qd, will monitor  3. FEN/GI: Regular diet, saline lock IV 4. Prophylaxis: Aspirin and SCDs 5. Disposition: Observation, home after  recovery from surgery 6. Code Status: Full   [name], MD 09/20/2012, 6:56 AM

## 2012-09-20 NOTE — Anesthesia Postprocedure Evaluation (Signed)
  Anesthesia Post-op Note  Patient: [name]  Procedure(s) Performed: Procedure(s) (LRB) with comments: TOTAL HIP ARTHROPLASTY (Left) - left total hip arthroplasty  Patient Location: PACU and Nursing Unit  Anesthesia Type: General  Level of Consciousness: awake, oriented and patient cooperative  Airway and Oxygen Therapy: Patient Spontanous Breathing  Post-op Pain: mild  Post-op Assessment: Post-op Vital signs reviewed, Patient's Cardiovascular Status Stable, Respiratory Function Stable, No signs of Nausea or vomiting, Adequate PO intake and Pain level controlled  Post-op Vital Signs: Reviewed and stable  Complications: No apparent anesthesia complications

## 2012-09-20 NOTE — Progress Notes (Signed)
Physical Therapy Treatment Patient Details Name: [name] MRN: [id number] DOB: 05-23-1961 Today's Date: 09/20/2012 Time: 1420-1436 PT Time Calculation (min): 16 min  PT Assessment / Plan / Recommendation Comments on Treatment Session  Met with patient to perform education for car transfer and expectations for mobility upon discharge.  Reviewed HEP with patient and provided visual demonstration on technique for in/out of car.  Patient states that he has no further questions or concerns for discharge from a mobility standpoint.  Patient able to recall 3/3 precautions and has been consistanly able to demonstrate adherence to precautions.  Anticipate patient will be safe for discharge home with home PT and family. Patient provided with hand out for HEP.    Follow Up Recommendations  Home health PT    Barriers to Discharge        Equipment Recommendations  None recommended by PT    Recommendations for Other Services    Frequency 7X/week   Plan Discharge plan remains appropriate    Precautions / Restrictions Precautions Precautions: Posterior Hip;Fall Precaution Booklet Issued: Yes (comment) Restrictions Weight Bearing Restrictions: Yes   Pertinent Vitals/Pain None reported at this time          Visit Information  Last PT Received On: 09/20/12 Assistance Needed: +1    Subjective Data  Subjective: I was feelin a little dizzy later this morning Patient Stated Goal: to go home   Cognition  Overall Cognitive Status: Appears within functional limits for tasks assessed/performed Arousal/Alertness: Awake/alert Orientation Level: Appears intact for tasks assessed Behavior During Session: [facility] for tasks performed       End of Session PT - End of Session Patient left: in bed;with call bell/phone within reach;with nursing in room Nurse Communication: Mobility status   GP     Fabio Asa 09/20/2012, 2:54 PM Charlotte Crumb, PT DPT  [phone]

## 2012-09-20 NOTE — Progress Notes (Addendum)
Physical Therapy Treatment Patient Details Name: [name] MRN: [id number] DOB: 1961/10/26 Today's Date: 09/20/2012 Time: 9562-1308 PT Time Calculation (min): 28 min  PT Assessment / Plan / Recommendation Comments on Treatment Session  Patient agreeable to PT despite low HgB.  Patient asymptomatic at this time. Completed stair negotiation training and was also instructed in methods for going over curbs.  Patient demonstrate safe and effective stair negotiation and ambulation.  Patient continues to make steady progress towwards PT goals at this time.  Will address car transfer and HEP with patient this afternoon in preparation for discharge home with family and home PT.    Follow Up Recommendations  Home health PT    Barriers to Discharge        Equipment Recommendations  None recommended by PT    Recommendations for Other Services    Frequency 7X/week   Plan Discharge plan remains appropriate    Precautions / Restrictions Precautions Precautions: Posterior Hip;Fall Precaution Booklet Issued: Yes (comment) Restrictions Weight Bearing Restrictions: Yes   Pertinent Vitals/Pain 7/10    Mobility  Bed Mobility Bed Mobility: Supine to Sit;Sitting - Scoot to Edge of Bed Supine to Sit: 5: Supervision;HOB elevated Sitting - Scoot to [place] of Bed: 5: Supervision Transfers Transfers: Sit to Stand;Stand to Sit Sit to Stand: 5: Supervision;From bed Stand to Sit: 5: Supervision;With armrests;To chair/3-in-1 Ambulation/Gait Ambulation/Gait Assistance: 5: Supervision Ambulation Distance (Feet): 260 Feet Assistive device: Rolling walker Ambulation/Gait Assistance Details: VC's for step thru cadence Gait Pattern: Step-through pattern;Decreased stride length;Decreased stance time - left;Decreased step length - right Gait velocity: decreased General Gait Details: patient educated on importance of early morning mobility to relieve aching stiffness Stairs: Yes (patient provided  instructional hand out) Stairs Assistance: 4: Min Estate agent Assistance Details (indicate cue type and reason): Assist to stabilize rw Stair Management Technique: Step to pattern;Backwards;With walker Number of Stairs: 6  (patient educated on hand placement and technique.  )      PT Goals Acute Rehab PT Goals Pt will go Sit to Stand: with supervision PT Goal: Sit to Stand - Progress: Met Pt will go Stand to Sit: with supervision PT Goal: Stand to Sit - Progress: Met Pt will Ambulate: >150 feet;with supervision;with least restrictive assistive device PT Goal: Ambulate - Progress: Met Pt will Go Up / Down Stairs: Flight;with supervision;with least restrictive assistive device;with rail(s) PT Goal: Up/Down Stairs - Progress: Progressing toward goal Pt will Perform Home Exercise Program: Independently PT Goal: Perform Home Exercise Program - Progress: Progressing toward goal  Visit Information  Last PT Received On: 09/20/12 Assistance Needed: +1    Subjective Data  Subjective: I had a rough night Patient Stated Goal: to go home with family   Cognition  Overall Cognitive Status: Appears within functional limits for tasks assessed/performed Arousal/Alertness: Awake/alert Orientation Level: Appears intact for tasks assessed Behavior During Session: [facility] for tasks performed       End of Session PT - End of Session Equipment Utilized During Treatment: Gait belt Activity Tolerance: Patient tolerated treatment well Patient left: in chair;with call bell/phone within reach Nurse Communication: Mobility status   GP     Fabio Asa 09/20/2012, 12:13 PM Charlotte Crumb, PT DPT  [phone]

## 2012-09-20 NOTE — Progress Notes (Signed)
OT Cancellation Note  Treatment cancelled today due to medical issues with patient which prohibited therapy (hgb currently 6.3). OT to continue to follow acutely.  Harrel Carina [place] Pager: [phone]  09/20/2012, 9:09 AM

## 2012-09-20 NOTE — Progress Notes (Signed)
PATIENT ID: [name]  MRN: [id number]  DOB/AGE:  1961-03-15 / 51 y.o.  2 Days Post-Op Procedure(s) (LRB): TOTAL HIP ARTHROPLASTY (Left)    PROGRESS NOTE Subjective: Patient is alert, oriented,no Nausea, no Vomiting, yes passing gas, no Bowel Movement. Taking PO well. Denies SOB, Chest or Calf Pain. Using Incentive Spirometer, PAS in place. Complains of dizziness when standing and mild fatigue. Ambulating very well with PT. Patient reports pain as moderate  .    Objective: Vital signs in last 24 hours: Filed Vitals:   09/19/12 2038 09/20/12 0000 09/20/12 0400 09/20/12 0550  BP: 128/71   126/75  Pulse: 124   87  Temp: 98.7 F (37.1 C)   100 F (37.8 C)  TempSrc: Oral   Oral  Resp: 18 20 21 18   Height:      Weight:      SpO2: 100% 100% 100% 100%      Intake/Output from previous day: I/O last 3 completed shifts: In: 2585 [P.O.:1620; I.V.:965] Out: 4350 [Urine:4350]   Intake/Output this shift:     LABORATORY DATA:  Basename 09/20/12 0705 09/19/12 0610  WBC 10.0 8.3  HGB 6.3* 9.1*  HCT 19.6* 28.4*  PLT 150 127*  NA -- 133*  K -- 4.2  CL -- 97  CO2 -- 20  BUN -- 7  CREATININE -- 0.84  GLUCOSE -- 126*  GLUCAP -- --  INR -- --  CALCIUM -- 9.2    Examination: Neurologically intact ABD soft Neurovascular intact Sensation intact distally Intact pulses distally Dorsiflexion/Plantar flexion intact Incision: scant drainage} XR AP&Lat of hip shows well placed\fixed THA  Assessment:   2 Days Post-Op Procedure(s) (LRB): TOTAL HIP ARTHROPLASTY (Left) ADDITIONAL DIAGNOSIS:  Acute Blood Loss Anemia  Plan: PT/OT WBAT, THA  posterior precautions  Would recommend possible PRBC transfusion.  Nursing spoke with medicine service, and they will evaluate patient this morning and then transfuse if appropriate.  DVT Prophylaxis: SCDx72 hrs, ASA 325 mg BID x 2 weeks  DISCHARGE PLAN: Home today if stable per medicine service.  F/U with Dr. Turner Daniels on POD  #14.  DISCHARGE NEEDS: HHPT, HHRN, Walker and 3-in-1 comode seat

## 2012-09-20 NOTE — Progress Notes (Signed)
I discussed with Dr Ermalinda Memos.  I agree with their plans documented in their progress  note for today. Agree with transfusion 1 unit PRBC for post-operative drop in hemoglobin. Monitor for adequate rise in Hgb with transfusion and stability of hgb before discharge home.

## 2012-09-21 LAB — TISSUE CULTURE

## 2012-09-21 LAB — TYPE AND SCREEN
ABO/RH(D): O POS
Unit division: 0

## 2012-09-21 LAB — CBC
HCT: 22.6 % — ABNORMAL LOW (ref 39.0–52.0)
Hemoglobin: 7.5 g/dL — ABNORMAL LOW (ref 13.0–17.0)
MCV: 71.1 fL — ABNORMAL LOW (ref 78.0–100.0)
RBC: 3.18 MIL/uL — ABNORMAL LOW (ref 4.22–5.81)
RDW: 19.2 % — ABNORMAL HIGH (ref 11.5–15.5)
WBC: 9.9 10*3/uL (ref 4.0–10.5)

## 2012-09-21 MED ORDER — OXYCODONE HCL 10 MG PO TB12: 20.0000 mg | ORAL_TABLET | Freq: Two times a day (BID) | ORAL | Status: DC

## 2012-09-21 MED ORDER — BISACODYL 5 MG PO TBEC: 5.0000 mg | DELAYED_RELEASE_TABLET | Freq: Every day | ORAL | Status: DC | PRN

## 2012-09-21 MED ORDER — LISINOPRIL 10 MG PO TABS: 10.0000 mg | ORAL_TABLET | Freq: Every day | ORAL | Status: DC

## 2012-09-21 MED ORDER — OXYCODONE HCL 5 MG PO TABS: 5.0000 mg | ORAL_TABLET | Freq: Four times a day (QID) | ORAL | Status: DC | PRN

## 2012-09-21 MED ORDER — OXYCODONE HCL 20 MG PO TB12: 20.0000 mg | ORAL_TABLET | Freq: Two times a day (BID) | ORAL | Status: DC

## 2012-09-21 NOTE — Progress Notes (Signed)
Physical Therapy Treatment Patient Details Name: EWARD RUTIGLIANO MRN: [id number] DOB: [birth year] Today's Date: 09/21/2012 Time: 0750-0819 PT Time Calculation (min): 29 min  PT Assessment / Plan / Recommendation Comments on Treatment Session  Patient tolerated ther-ex well. No reports of dizziness today.  Patient able to increase ambulation distance and progress gait to step thru.  Patient has made great progress, feel patient will be safe for discharge home with home PT.    Follow Up Recommendations  Home health PT    Barriers to Discharge        Equipment Recommendations  None recommended by PT    Recommendations for Other Services    Frequency 7X/week   Plan Discharge plan remains appropriate    Precautions / Restrictions Precautions Precautions: Posterior Hip;Fall Precaution Booklet Issued: Yes (comment) Restrictions Weight Bearing Restrictions: Yes   Pertinent Vitals/Pain 5/10    Mobility  Bed Mobility Bed Mobility: Supine to Sit;Sitting - Scoot to Edge of Bed Supine to Sit: 6: Modified independent (Device/Increase time) Sitting - Scoot to Edge of Bed: 6: Modified independent (Device/Increase time) Transfers Transfers: Sit to Stand;Stand to Sit Sit to Stand: 6: Modified independent (Device/Increase time) Stand to Sit: 6: Modified independent (Device/Increase time) Ambulation/Gait Ambulation/Gait Assistance: 5: Supervision Assistive device: Rolling walker Ambulation/Gait Assistance Details: VC's to look up Gait Pattern: Step-through pattern;Decreased stride length;Antalgic Gait velocity: decreased    Exercises Total Joint Exercises Ankle Circles/Pumps: AROM;Both;10 reps Quad Sets: Strengthening;Left;10 reps Gluteal Sets: Strengthening;Left;10 reps Short Arc Quad: Strengthening;Left;10 reps Hip ABduction/ADduction: Strengthening;Left;10 reps Straight Leg Raises: Strengthening;Left;10 reps Long Arc Quad: Strengthening;Left;10 reps Marching in Standing:  Strengthening;Left;10 reps Standing Hip Extension: Strengthening;Left;10 reps General Exercises - Lower Extremity Mini-Sqauts: Strengthening;Left;10 reps    PT Goals Acute Rehab PT Goals PT Goal: Sit to Stand - Progress: Met PT Goal: Stand to Sit - Progress: Met PT Goal: Ambulate - Progress: Met PT Goal: Perform Home Exercise Program - Progress: Met  Visit Information  Last PT Received On: 09/21/12 Assistance Needed: +1    Subjective Data  Subjective: Im gonna be discharged today  Patient Stated Goal: to go home   Cognition  Overall Cognitive Status: Appears within functional limits for tasks assessed/performed Arousal/Alertness: Awake/alert Orientation Level: Appears intact for tasks assessed Behavior During Session: [facility] for tasks performed       End of Session PT - End of Session Equipment Utilized During Treatment: Gait belt Activity Tolerance: Patient tolerated treatment well Patient left: in chair;with call bell/phone within reach Nurse Communication: Mobility status   GP     Fabio Asa 09/21/2012, 8:20 AM Charlotte Crumb, PT DPT  [phone]

## 2012-09-21 NOTE — Progress Notes (Signed)
Subjective: 3 Days Post-Op Procedure(s) (LRB): TOTAL HIP ARTHROPLASTY (Left) Patient reports pain as 2 on 0-10 scale.   Up with PT ambulating in hall. No dizziness.   Objective: Vital signs in last 24 hours: Temp:  [98.4 F (36.9 C)-99.3 F (37.4 C)] 98.5 F (36.9 C) (09/28 0625) Pulse Rate:  [100-127] 100  (09/28 0625) Resp:  [16-18] 18  (09/28 0625) BP: (124-144)/(59-92) 132/64 mmHg (09/28 0625) SpO2:  [96 %-100 %] 98 % (09/28 0625)  Intake/Output from previous day: 09/27 0701 - 09/28 0700 In: 1102 [P.O.:800; Blood:302] Out: 1100 [Urine:1100] Intake/Output this shift:     Basename 09/21/12 0535 09/20/12 1818 09/20/12 0705 09/19/12 0610  HGB 7.5* 7.4* 6.3* 9.1*    Basename 09/21/12 0535 09/20/12 1818  WBC 9.9 10.3  RBC 3.18* 3.22*  HCT 22.6* 22.7*  PLT 171 143*    Basename 09/20/12 0817 09/19/12 0610  NA 134* 133*  K 3.7 4.2  CL 97 97  CO2 27 20  BUN 5* 7  CREATININE 0.83 0.84  GLUCOSE 122* 126*  CALCIUM 9.0 9.2   No results found for this basename: LABPT:2,INR:2 in the last 72 hours  Neurovascular intact Sensation intact distally Dorsiflexion/Plantar flexion intact Incision: dressing C/D/I Compartment soft  Assessment/Plan: 3 Days Post-Op Procedure(s) (LRB): TOTAL HIP ARTHROPLASTY (Left) Acute blood loss anemia Plan: Discharge home with home health See [facility] note from yest. Would add Robaxin 500mg  prn spasm. Follow up with Dr Turner Daniels in 2 weeks.  Daxson Reffett G 09/21/2012, 8:29 AM

## 2012-09-21 NOTE — Progress Notes (Signed)
Family Medicine Teaching Service Daily Progress Note Service Page: [phone]  Patient Assessment: 51 y/o male with PMH of GAS bacteremia with multiple infective foci here with L hip pain for 1 week.   Subjective:  Patient was in the bedside chair eating breakfast. He reports his pain ws well controlled last night. He slept well. He has been eating well and has had a BM since surgery. His hemoglobin;obin is stable today at 7.5  He asking to get back in bed.  Objective: Temp:  [98.4 F (36.9 C)-99.3 F (37.4 C)] 98.5 F (36.9 C) (09/28 0625) Pulse Rate:  [100-127] 100  (09/28 0625) Resp:  [16-18] 18  (09/28 0800) BP: (124-144)/(59-92) 132/64 mmHg (09/28 0625) SpO2:  [96 %-100 %] 99 % (09/28 0800) Exam: Gen: NAD, alert, cooperative with exam. Sitting on ice pack. CV: RRR, no murmur  Resp: CTABL, no wheezes, rales or rhonchi. WOB normal.  Abd: SNTND, BS present, no guarding or organomegaly  Ext: No edema, bandage clean dry and intact on L hip.  Neuro: Alert and oriented, No gross deficits  I have reviewed the patient's medications, labs, imaging, and diagnostic testing.  Notable results are summarized below.  CBC BMET   Lab 09/21/12 0535 09/20/12 1818 09/20/12 0705  WBC 9.9 10.3 10.0  HGB 7.5* 7.4* 6.3*  HCT 22.6* 22.7* 19.6*  PLT 171 143* 150    Lab 09/20/12 0817 09/19/12 0610 09/15/12 1812 09/15/12 1459  NA 134* 133* -- 138  K 3.7 4.2 -- 4.3  CL 97 97 -- 101  CO2 27 20 -- 25  BUN 5* 7 -- 7  CREATININE 0.83 0.84 0.88 --  GLUCOSE 122* 126* -- 86  CALCIUM 9.0 9.2 -- 10.0     Plan: [name] is a 51 y.o. year old male with PMH of GAS bacteremia with multiple infective foci presenting with L hip and L wrist pain and subsequent difficulty walking for 1 week.   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 S/p total hip arthroplasty day 2.  Pain not well controlled since early morning  hours.  1. Pain control with scheduled scheduled oxycontin and PRN dilaudid, robaxin, and oxycodone 1. Encourage patient to ask for PRNs, conisder increasing oxycontin 2. Ortho consulted- recommendations much appreciated!   - s/p THA day 3 3. Hgb stable today at 7.4--> 7.5 today   FEN/GI: Regular diet, saline lock IV Prophylaxis: Aspirin and SCDs Disposition: Observation, home after recovery from surgery; possible dc today Code Status: Full   Jesslyn Viglione, DO 09/21/2012, 8:57 AM

## 2012-09-21 NOTE — Evaluation (Signed)
Occupational Therapy Evaluation Patient Details Name: [name] MRN: [id number] DOB: October 02, 1961 Today's Date: 09/21/2012 Time: 1320-1350 OT Time Calculation (min): 30 min  OT Assessment / Plan / Recommendation Clinical Impression  Pt presents to OT with decreased I with ADL activity s/p hip surgery. Pt going home today and has been educated regarding ADL activity and hip precautions    OT Assessment  All further OT needs can be met in the next venue of care    Follow Up Recommendations  Home health OT       Equipment Recommendations  None recommended by OT          Precautions / Restrictions Precautions Precautions: Posterior Hip;Fall Precaution Booklet Issued: Yes (comment) Restrictions Weight Bearing Restrictions: Yes       ADL  Grooming: Simulated Where Assessed - Grooming: Unsupported standing Upper Body Bathing: Performed;Set up Where Assessed - Upper Body Bathing: Unsupported sitting Lower Body Bathing: Simulated;Minimal assistance Where Assessed - Lower Body Bathing: Unsupported sit to stand Upper Body Dressing: Simulated;Set up Where Assessed - Upper Body Dressing: Unsupported sitting Lower Body Dressing: Performed;Minimal assistance;Other (comment) (with AE) Where Assessed - Lower Body Dressing: Unsupported sit to stand Toilet Transfer: Simulated;Supervision/safety Toilet Transfer Method: Sit to stand Toileting - Architect and Hygiene: Simulated;Supervision/safety ADL Comments: Educated on use of reacher and sock aid, as well as shoe horn    OT Diagnosis: Generalized weakness  OT Problem List: Decreased strength    Visit Information  Last OT Received On: 09/21/12    Subjective Data  Subjective: I will really use that reacher   Prior Functioning     Home Living Lives With: Spouse;Family Available Help at Discharge: Family Type of Home: House Home Access: Level entry Entrance Stairs-Number of Steps: 1 Home Layout: Two  level;Bed/bath upstairs Alternate Level Stairs-Rails: Right Bathroom Shower/Tub: Engineer, manufacturing systems: Standard Home Adaptive Equipment: Bedside commode/3-in-1;Straight cane;Walker - rolling Prior Function Level of Independence: Independent with assistive device(s) Able to Take Stairs?: Yes Driving: Yes Vocation: Unemployed Communication Communication: No difficulties            Cognition  Overall Cognitive Status: Appears within functional limits for tasks assessed/performed Arousal/Alertness: Awake/alert Orientation Level: Appears intact for tasks assessed Behavior During Session: [facility] for tasks performed    Extremity/Trunk Assessment Right Upper Extremity Assessment RUE ROM/Strength/Tone: WFL for tasks assessed RUE Sensation: History of peripheral neuropathy Left Upper Extremity Assessment LUE ROM/Strength/Tone: WFL for tasks assessed LUE Sensation: History of peripheral neuropathy Right Lower Extremity Assessment RLE ROM/Strength/Tone: WFL for tasks assessed Left Lower Extremity Assessment LLE ROM/Strength/Tone: Deficits;Unable to fully assess;Due to pain;Due to precautions Trunk Assessment Trunk Assessment: Normal     Mobility Transfers Transfers: Sit to Stand;Stand to Sit Sit to Stand: 5: Supervision Stand to Sit: 6: Modified independent (Device/Increase time);5: Supervision              End of Session OT - End of Session Activity Tolerance: Patient tolerated treatment well Patient left: with family/visitor present;in bed  GO     Synia Douglass, [place] Kung 09/21/2012, 2:04 PM

## 2012-09-21 NOTE — Care Management Note (Signed)
    Page 1 of 1   09/21/2012     4:09:02 PM   CARE MANAGEMENT NOTE 09/21/2012  Patient:  [name], [name]   Account Number:  [id number]  Date Initiated:  09/19/2012  Documentation initiated by:  Anette Guarneri  Subjective/Objective Assessment:   left hip pain  9/25 left THA  HH services, DME     Action/Plan:   home with [facility] services. CM spoke with patient regarding HH needs. Has rolling walker and 3in1. Has family support.   Anticipated DC Date:  09/21/2012   Anticipated DC Plan:  HOME W HOME HEALTH SERVICES      DC Planning Services  CM consult      Choice offered to / List presented to:  C-1 Patient        HH arranged  HH-1 RN  HH-2 PT      [facility] agency  [facility]   Status of service:  Completed, signed off Medicare Important Message given?   (If response is "NO", the following Medicare IM given date fields will be blank) Date Medicare IM given:   Date Additional Medicare IM given:    Discharge Disposition:  HOME W HOME HEALTH SERVICES  Per UR Regulation:  Reviewed for med. necessity/level of care/duration of stay  If discussed at Long Length of Stay Meetings, dates discussed:    Comments:  09/21/2012  Konrad Felix RN, case manager   [phone] Verified with attanding nurse that patient is well-prepared for his return to home and [facility] services have already been established.

## 2012-09-21 NOTE — Progress Notes (Signed)
Contacted [facility] for DME for scheduled d/c home today. Verl Kitson RN CCM Case Mgmt phone [phone] 

## 2012-09-21 NOTE — Progress Notes (Signed)
I discussed with Dr Kuneff.  I agree with their plans documented in their progress note for today.  

## 2012-09-23 LAB — ANAEROBIC CULTURE

## 2012-09-23 NOTE — Discharge Summary (Signed)
Family Medicine Teaching [facility] Discharge Summary  Patient name: [name] Medical record number: [id number] Date of birth: [birth year] Age: 51 y.o. Gender: male Date of Admission: 09/15/2012  Date of Discharge: 09/21/2012 Admitting Physician: Barbaraann Barthel, MD  Primary Care Provider: Sheila Oats, MD  Indication for Hospitalization: L hip pain Discharge Diagnoses:  1. Left hip pain  2. Status post Total hip arthroplasty 3. HTN  Consultations: Orthopedic Surgery  Significant Labs and Imaging:   Hemoglobin on discharge 7.5 serum uric acid : 6.9  Cell count from joint aspirate: 0 WBC and no crystals  Joint aspirate culture: No growth  DG Wrist complete 09/15/2012  IMPRESSION:  No acute fracture or subluxation. There is narrowing of radiocarpal joint space. Soft tissue swelling is noted dorsal carpal region. Tiny bony fragment dorsal aspect of the lunate appears well corticated probable due to prior injury. Mild degenerative changes carpal region.  DG L hip 09/15/2012  IMPRESSION:  Marked asymmetric destruction of the left hip joint, likely progressed from abdominal CT performed 03/2012. Correlation for signs and symptoms of septic arthritis is recommended.  DG Pelvis 09/18/2012 IMPRESSION:  Left hip replacement. No complicating feature.    Procedures: Total hip arthroplasty 09/18/2012  Brief Hospital Course:  [name] is a 52 y.o. 51 year old male with PMH of GAS bacteremia with multiple infective foci that presented to our ED with L hip and subsequent difficulty walking for 1 week. He was treated as detailed below.   Left hip pain - With his PMH there was a high index of suspicion this may be an septic hip. His joint was aspirated and septic joint was ruled out with a a cell count and culture and he was treated with IV vancomycin and zosyn empirically. His hip was evaluated by orthopedic surgery and he received a total hip arthroplasty the following  day. Post op day 2 his hemoglobin dropped as low as 6.3. He was transfused 1 unit of PRBCs an his hemoglobin stabilized over the next day. He worked well with PT here at the hospital and arrangements were made for him to have home health PT. His pain was controlled with PO oxycontin scheduled and PRN oxycodone. Post op anticoagulation per orthopedic surgery was BID aspirin 325 mg and SCDs while inpatient. He was stable and discharged home.   HTN: He was hypertensive throughout the beginning of his admission and started on lisinopril 10 qd whic h controlled his blood pressure well.   Dispo: Home  Discharge Medications:    Medication List     As of 09/23/2012  9:28 PM    STOP taking these medications         GOODYS BODY PAIN PO      TAKE these medications         acetaminophen 325 MG tablet   Commonly known as: TYLENOL   Take 650 mg by mouth every 6 (six) hours as needed. pain      bisacodyl 5 MG EC tablet   Commonly known as: DULCOLAX   Take 1 tablet (5 mg total) by mouth daily as needed for constipation.      lisinopril 10 MG tablet   Commonly known as: PRINIVIL,ZESTRIL   Take 1 tablet (10 mg total) by mouth daily.      oxyCODONE 5 MG immediate release tablet   Commonly known as: Oxy IR/ROXICODONE   Take 1 tablet (5 mg total) by mouth every 6 (six) hours as needed.  oxyCODONE 20 MG 12 hr tablet   Commonly known as: OXYCONTIN   Take 1 tablet (20 mg total) by mouth every 12 (twelve) hours.       Issues for Follow Up: Consider follow-up CBC with Hgb of 7.5 on dc.   Outstanding Results: None  Discharge Instructions: Please refer to Patient Instructions section of EMR for full details.  Patient was counseled important signs and symptoms that should prompt return to medical care, changes in medications, dietary instructions, activity restrictions, and follow up appointments.  Significant instructions noted below:   Discharge Condition: Stable   [name],  MD 09/23/2012, 9:28 PM

## 2012-11-03 ENCOUNTER — Encounter (HOSPITAL_COMMUNITY): Payer: Self-pay | Admitting: Emergency Medicine

## 2012-11-03 ENCOUNTER — Emergency Department (HOSPITAL_COMMUNITY)
Admission: EM | Admit: 2012-11-03 | Discharge: 2012-11-03 | Disposition: A | Discharge status: Home / Self Care | Payer: Medicaid Other | Attending: Emergency Medicine | Admitting: Emergency Medicine

## 2012-11-03 ENCOUNTER — Emergency Department (HOSPITAL_COMMUNITY): Payer: Medicaid Other

## 2012-11-03 DIAGNOSIS — R209 Unspecified disturbances of skin sensation: Secondary | ICD-10-CM | POA: Insufficient documentation

## 2012-11-03 DIAGNOSIS — M958 Other specified acquired deformities of musculoskeletal system: Secondary | ICD-10-CM

## 2012-11-03 DIAGNOSIS — M25519 Pain in unspecified shoulder: Secondary | ICD-10-CM | POA: Insufficient documentation

## 2012-11-03 DIAGNOSIS — M959 Acquired deformity of musculoskeletal system, unspecified: Secondary | ICD-10-CM | POA: Insufficient documentation

## 2012-11-03 DIAGNOSIS — Z9889 Other specified postprocedural states: Secondary | ICD-10-CM | POA: Insufficient documentation

## 2012-11-03 DIAGNOSIS — I10 Essential (primary) hypertension: Secondary | ICD-10-CM | POA: Insufficient documentation

## 2012-11-03 LAB — POCT I-STAT, CHEM 8
Creatinine, Ser: 1 mg/dL (ref 0.50–1.35)
Glucose, Bld: 76 mg/dL (ref 70–99)
HCT: 47 % (ref 39.0–52.0)
Hemoglobin: 16 g/dL (ref 13.0–17.0)
TCO2: 26 mmol/L (ref 0–100)

## 2012-11-03 LAB — CBC WITH DIFFERENTIAL/PLATELET
Basophils Relative: 1 % (ref 0–1)
Eosinophils Absolute: 0.3 10*3/uL (ref 0.0–0.7)
Eosinophils Relative: 4 % (ref 0–5)
Hemoglobin: 13.7 g/dL (ref 13.0–17.0)
Lymphs Abs: 1.4 10*3/uL (ref 0.7–4.0)
MCH: 23.5 pg — ABNORMAL LOW (ref 26.0–34.0)
MCHC: 32.2 g/dL (ref 30.0–36.0)
MCV: 72.9 fL — ABNORMAL LOW (ref 78.0–100.0)
Monocytes Relative: 8 % (ref 3–12)
Neutrophils Relative %: 71 % (ref 43–77)
Platelets: 234 10*3/uL (ref 150–400)
RBC: 5.83 MIL/uL — ABNORMAL HIGH (ref 4.22–5.81)

## 2012-11-03 MED ORDER — OXYCODONE-ACETAMINOPHEN 5-325 MG PO TABS
1.0000 | ORAL_TABLET | Freq: Once | ORAL | Status: AC
  Administered 2012-11-03: 1 via ORAL
  Filled 2012-11-03: qty 1

## 2012-11-03 MED ORDER — OXYCODONE-ACETAMINOPHEN 5-325 MG PO TABS: 1.0000 | ORAL_TABLET | Freq: Four times a day (QID) | ORAL | Status: DC | PRN

## 2012-11-03 NOTE — ED Provider Notes (Addendum)
History  This chart was scribed for [name] Sprout, MD by Erskine Emery. This patient was seen in room TR09C/TR09C and the patient's care was started at 15:13.   CSN: [id number]  Arrival date & time 11/03/12  1437   First MD Initiated Contact with Patient 11/03/12 1513      Chief Complaint  Patient presents with  . Shoulder Pain    (Consider location/radiation/quality/duration/timing/severity/associated sxs/prior treatment) The history is provided by the patient. No language interpreter was used.  Trejan L Caven is a 51 y.o. male who presents to the Emergency Department complaining of gradually worsening right shoulder pain that radiates down the arm with associated numbness for the past few months. Pt had surgery just above his right clavicle a few months ago and had to have bone removed. He has been having complications on that side ever since. Pt has been taking ibuprofen which does nothing to relieve his pain. Pt was taking antibiotics, but is not currently on anything.   Past Medical History  Diagnosis Date  . Hypertension     Past Surgical History  Procedure Date  . Back surgery   . Radical neck dissection 04/17/2012    Procedure: RADICAL NECK DISSECTION;  Surgeon: Flo Shanks, MD;  Location: [facility] OR;  Service: ENT;  Laterality: Right;  Right Neck Exploration  . Direct laryngoscopy 04/17/2012    Procedure: DIRECT LARYNGOSCOPY;  Surgeon: Flo Shanks, MD;  Location: [facility] OR;  Service: ENT;  Laterality: Bilateral;  . Rigid esophagoscopy 04/17/2012    Procedure: RIGID ESOPHAGOSCOPY;  Surgeon: Flo Shanks, MD;  Location: [facility] OR;  Service: ENT;  Laterality: Bilateral;  . I&d extremity 04/18/2012    Procedure: IRRIGATION AND DEBRIDEMENT EXTREMITY;  Surgeon: Sharma Covert, MD;  Location: [facility] OR;  Service: Orthopedics;  Laterality: Left;  lt forearm and wrist incicsion and drainage  . Lymph node biopsy 04/24/2012    Procedure: LYMPH NODE BIOPSY;  Surgeon: Flo Shanks, MD;  Location:  [facility] OR;  Service: ENT;  Laterality: Right;  Right Neck/Upper Chest Exploration  . I&d extremity 04/24/2012    Procedure: IRRIGATION AND DEBRIDEMENT EXTREMITY;  Surgeon: Sharma Covert, MD;  Location: [facility] OR;  Service: Orthopedics;  Laterality: Left;  . Total hip arthroplasty 09/18/2012    Procedure: TOTAL HIP ARTHROPLASTY;  Surgeon: Nestor Lewandowsky, MD;  Location: [facility] OR;  Service: Orthopedics;  Laterality: Left;  left total hip arthroplasty    No family history on file.  History  Substance Use Topics  . Smoking status: Never Smoker   . Smokeless tobacco: Never Used  . Alcohol Use: 1.5 oz/week    3 drink(s) per week     Comment: occassional      Review of Systems  Constitutional: Negative for fever and chills.  Respiratory: Negative for shortness of breath.   Gastrointestinal: Negative for nausea and vomiting.  Musculoskeletal:       Right shoulder pain  Neurological: Positive for numbness. Negative for weakness.    Allergies  Review of patient's allergies indicates no known allergies.  Home Medications   Current Outpatient Rx  Name  Route  Sig  Dispense  Refill  . IBUPROFEN 200 MG PO TABS   Oral   Take 600 mg by mouth every 6 (six) hours as needed. For pain           BP 160/102  Pulse 110  Temp 98.6 F (37 C) (Oral)  Resp 20  SpO2 100%  Physical Exam  Nursing note and vitals  reviewed. Constitutional: He is oriented to person, place, and time. He appears well-developed and well-nourished. No distress.  HENT:  Head: Normocephalic and atraumatic.  Eyes: EOM are normal. Pupils are equal, round, and reactive to light.  Neck: Neck supple. No tracheal deviation present.  Cardiovascular: Normal rate.   Pulmonary/Chest: Effort normal. No respiratory distress.  Abdominal: Soft. He exhibits no distension.  Musculoskeletal: Normal range of motion. He exhibits no edema.       Deformity at the head of the clavicle that moves freely. It is tender with range of shoulder. Normal  distal pulse. Normal strength in upper extrenities.  Neurological: He is alert and oriented to person, place, and time.  Skin: Skin is warm and dry.       Well-healed scar at head of clavicle with no erythema, drainage, or swelling.   Psychiatric: He has a normal mood and affect.    ED Course  Procedures (including critical care time) DIAGNOSTIC STUDIES: Oxygen Saturation is 100% on room air, normal by my interpretation.    COORDINATION OF CARE: 15:28--I evaluated the patient and we discussed a treatment plan including x-rays and pain medication to which the pt agreed.    Labs Reviewed  CBC WITH DIFFERENTIAL - Abnormal; Notable for the following:    RBC 5.83 (*)     MCV 72.9 (*)     MCH 23.5 (*)     RDW 16.2 (*)     All other components within normal limits  POCT I-STAT, CHEM 8 - Abnormal; Notable for the following:    Calcium, Ion 1.25 (*)     All other components within normal limits   Dg Clavicle Right  11/03/2012  *RADIOLOGY REPORT*  Clinical Data: Pain, deformity  RIGHT CLAVICLE - 2+ VIEWS  Comparison: None.  Findings: Two views of the right clavicle submitted.  No acute fracture or subluxation.  IMPRESSION: No acute fracture or subluxation.   Original Report Authenticated By: Natasha Mead, M.D.      1. Shoulder pain   2. Deformity, clavicle       MDM   Patient is presenting complaining of the clavicle pain. On exam he has evidence of deformity at the head of the clavicle. He states in the past he has had an infection requiring removal of the bone. Looking back through patient's records he had a history of multiple cystic areas with hypotension and sepsis. 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 Patient today however is complaining of pain when he moves his shoulder and his clavicular region. There is cracking and popping over the joint discussed but no signs of erythema or  infection. He denies any fevers and has no evidence of weakness to the arm. Feel most likely this is pain from a chronic source that will need followup with orthopedist. Plain films, CBC and i-STAT pending.  4:37 PM Films and labs wnl.  Will give referral to ortho and pain control.    I personally performed the services described in this documentation, which was scribed in my presence.  The recorded information has been reviewed and considered.    [name] Sprout, MD 11/03/12 1609  [name] Sprout, MD 11/03/12 1637  [name] Sprout, MD 11/03/12 1640

## 2012-11-03 NOTE — ED Notes (Addendum)
Pt c/o right shoulder pain with numbness to right arm x 2 weeks. Pt has had surgery to right side of collar bone in past. Pt denies recent injury.

## 2012-11-03 NOTE — ED Notes (Signed)
Pt. Stated, My shoulder and collar bone is in pain and keeps coming out of the joint.

## 2012-11-08 ENCOUNTER — Other Ambulatory Visit: Payer: Self-pay | Admitting: Orthopedic Surgery

## 2012-11-08 ENCOUNTER — Ambulatory Visit
Admission: RE | Admit: 2012-11-08 | Discharge: 2012-11-08 | Disposition: A | Discharge status: Home / Self Care | Payer: Medicaid Other | Source: Ambulatory Visit | Attending: Orthopedic Surgery | Admitting: Orthopedic Surgery

## 2012-11-08 DIAGNOSIS — M2550 Pain in unspecified joint: Secondary | ICD-10-CM

## 2013-01-12 IMAGING — CT CT CHEST W/ CM
4 of 7 series · 17 of 36 positions shown, 18 images · IV contrast (APPLIED)
Comparison: Chest CT 04/17/2012.

CLINICAL DATA: Abscess with extension into the chest.

CT CHEST WITH CONTRAST
TECHNIQUE: Multidetector CT imaging of the chest was performed
following the standard protocol during bolus administration of
intravenous contrast.
Contrast: 100mL OMNIPAQUE IOHEXOL 300 MG/ML  SOLN

[Series 3: chest 5.0 st · axial · 0.70mm/px · z∈[+1020,+1160]mm · 3 of 58 slices shown, 4 images]
[im 15/58  mediastinal]
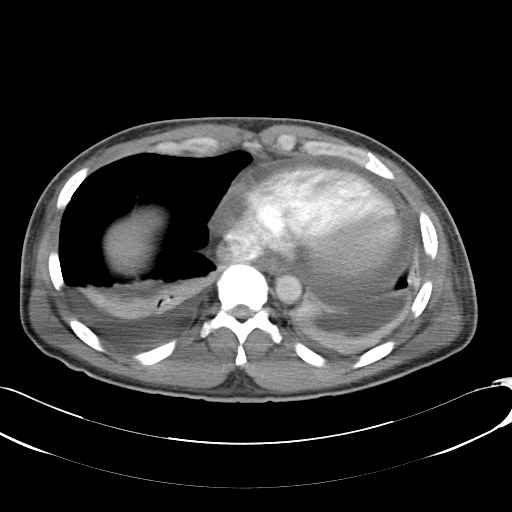
[im 15/58  lung]
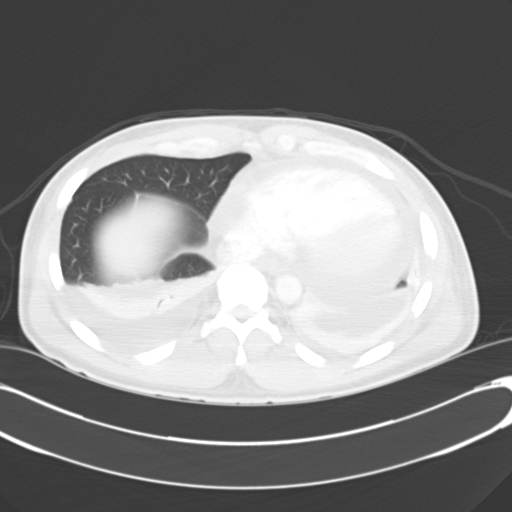
[im 29/58  lung]
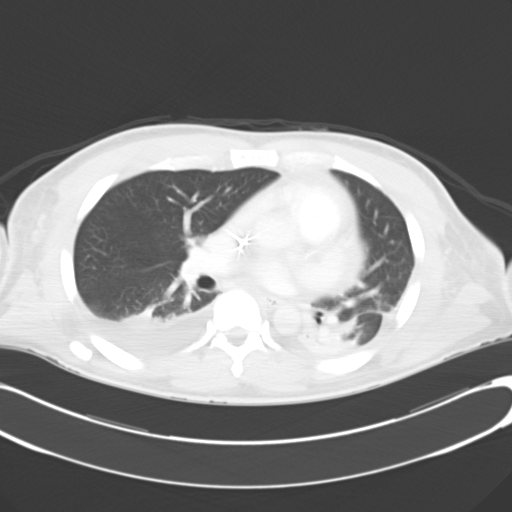
[im 43/58  lung]
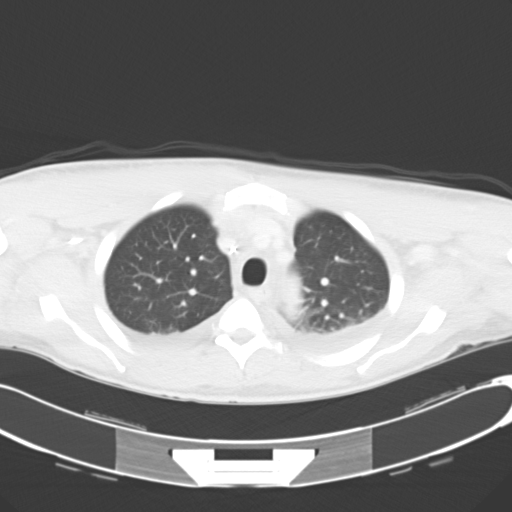

[Series 6: st neck 2.0 b31s · axial · 0.51mm/px · z∈[+1163,+1277]mm · 5 of 115 slices shown]
[im 12/115  lung]
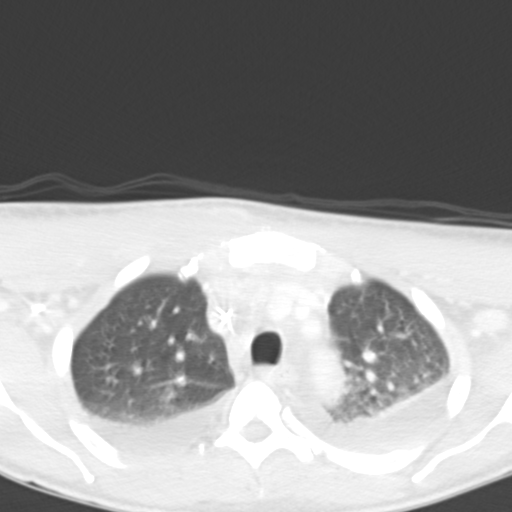
[im 23/115  lung]
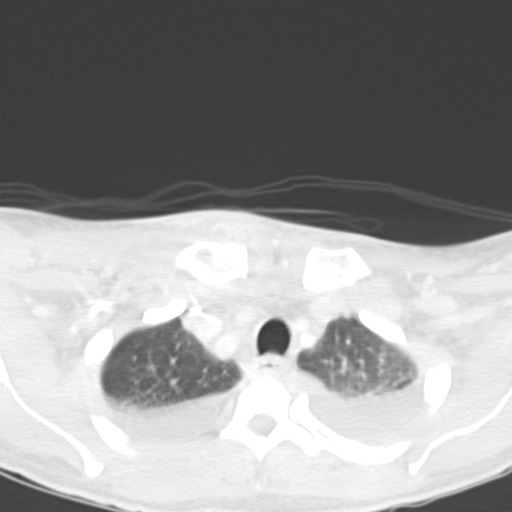
[im 35/115  lung]
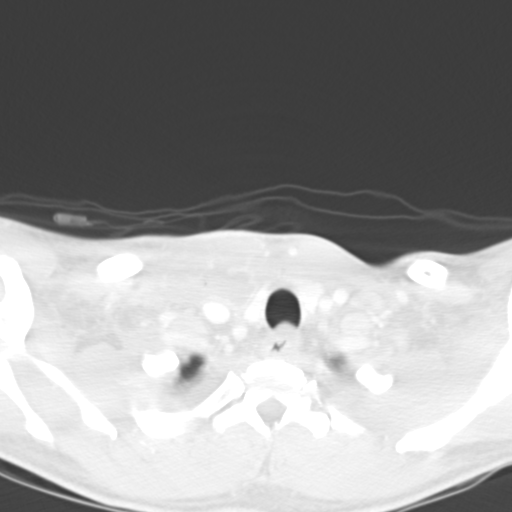
[im 46/115  lung]
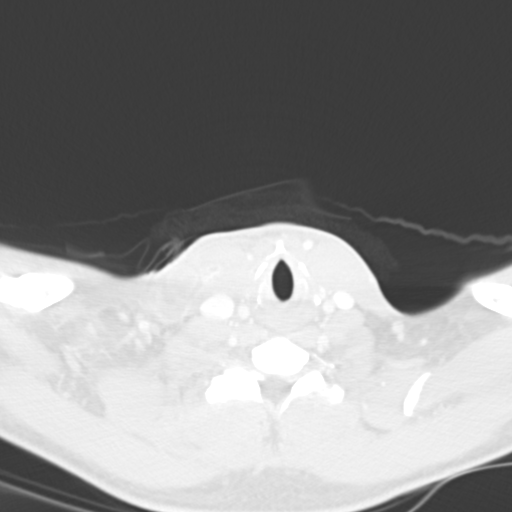
[im 69/115  lung]
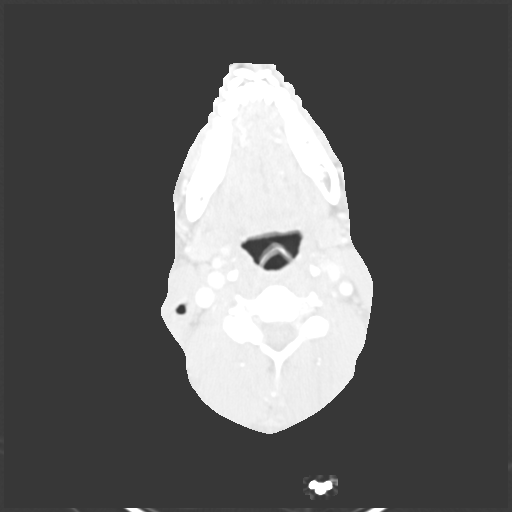

[Series 602: coronals · coronal · 0.70mm/px · 1 of 96 slices shown]
[im 48/96  lung]
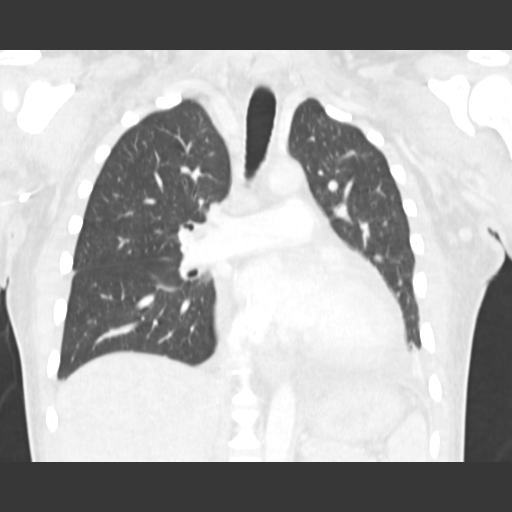

[Series 604: orthog · axial · 0.51mm/px · z∈[+1118,+1313]mm · 8 of 128 slices shown]
[im 12/128  lung]
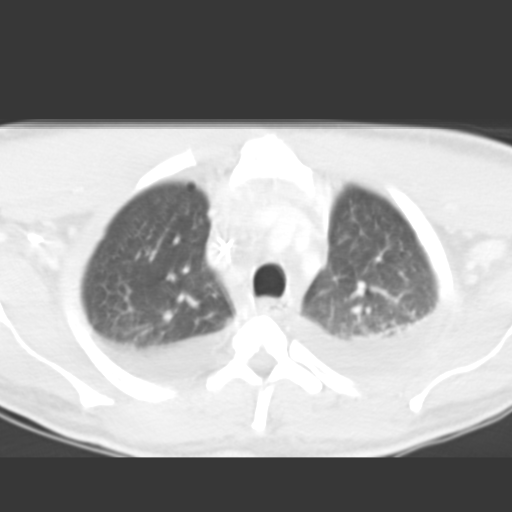
[im 24/128  lung]
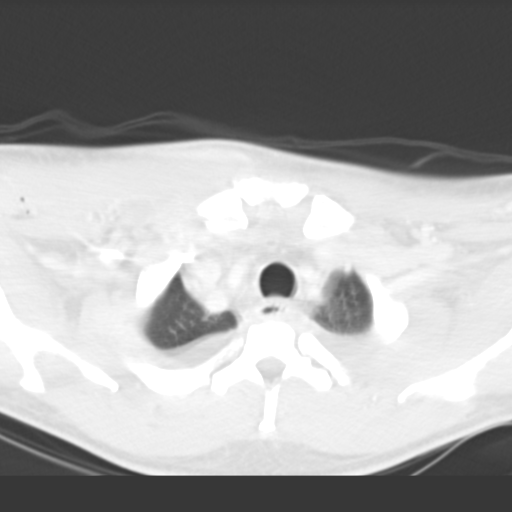
[im 47/128  lung]
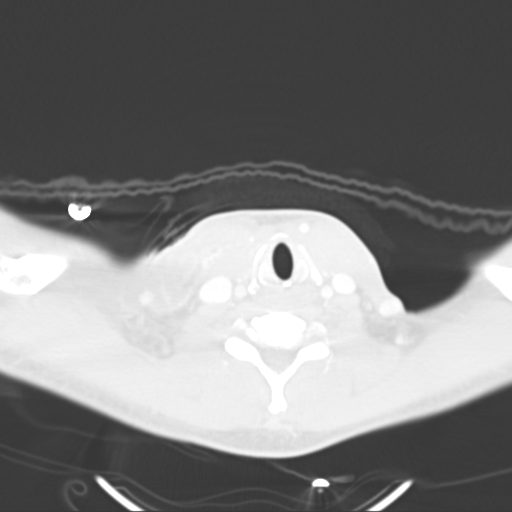
[im 58/128  lung]
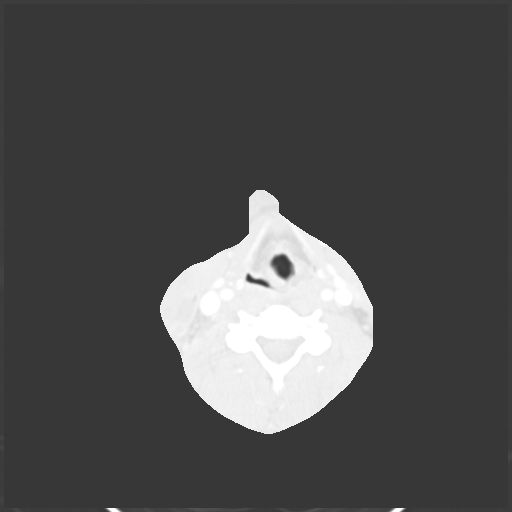
[im 70/128  lung]
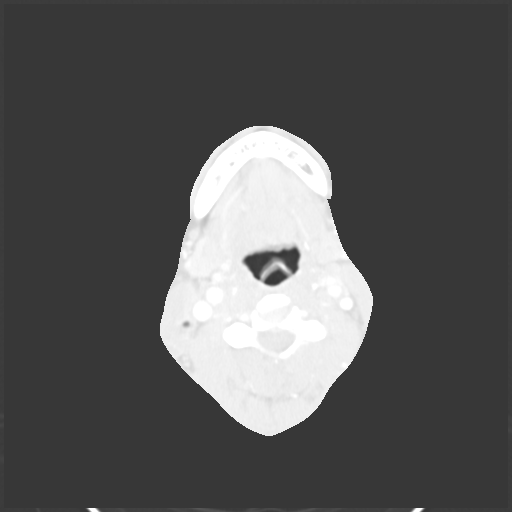
[im 81/128  lung]
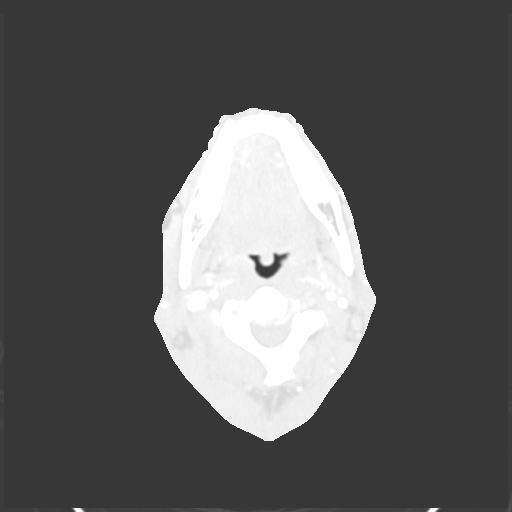
[im 104/128  lung]
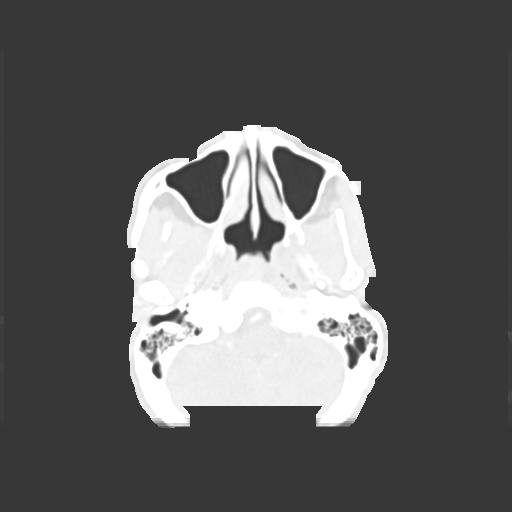
[im 116/128  lung]
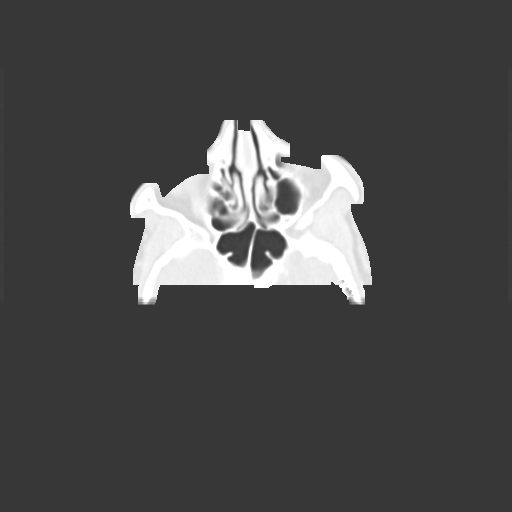

[17 of 36 positions shown; findings below may reference images not displayed]

FINDINGS: Mediastinum: Heart size is normal. There is a small amount of
pericardial fluid and/or thickening, with some mild pericardial
enhancement, slightly increased compared to prior study 04/17/2012.
The previously described collection of gas and fluid in the lower
right sternocleidomastoid muscle extending to the sternoclavicular
joint is again noted, appears slightly smaller than the prior
examination.  Around the sternoclavicular joint there is some
abnormal soft tissue thickening and enhancement, which encroaches
upon the superior mediastinum.  No more inferior mediastinal
extension of this process is identified on today's examination. No
pathologically enlarged mediastinal or hilar lymph nodes. Esophagus
is unremarkable in appearance.  A right upper extremity PICC is in
place with tip terminating in the right atrium.

Lungs/Pleura: There are moderate right and small left-sided pleural
effusions layering dependently, with associated passive atelectasis
in the lower lobes of the lungs bilaterally.  No definite
consolidative airspace disease.  No definite suspicious appearing
pulmonary nodules or masses are identified.

Upper Abdomen: Visualized portions are unremarkable.

Musculoskeletal: Again noted is some extension of the abnormal
enhancing fluid collection into the muscle belly of the right
pectoralis major.  There is some small locules of gas within the
right pectoralis major muscle, which may be indicative of gas-
forming organisms, or recent surgical intervention. There are no
aggressive appearing lytic or blastic lesions noted in the
visualized portions of the skeleton.
IMPRESSION: 1.  Slight interval decrease in size of an abnormal enhancing gas
containing fluid collection centered in the lower right
sternocleidomastoid muscle around the right sternoclavicular joint
with some extension of the right pectoralis muscle.  The at this
time, there is some inflammatory changes that result in local mass
effect around the right sternoclavicular joint, without frank
extension into the mediastinum.
2.  Moderate right and small left-sided pleural effusions layering
dependently with associated dependent passive atelectasis in the
lower lobes of the lungs bilaterally.
3.  Slight interval increase in the amount of pericardial fluid
and/or thickening with pericardial enhancement compared to the
prior study. These findings can be seen in the setting of
pericarditis.  Clinical correlation is recommended.

## 2013-01-29 ENCOUNTER — Other Ambulatory Visit: Payer: Self-pay | Admitting: Otolaryngology

## 2013-01-29 DIAGNOSIS — M869 Osteomyelitis, unspecified: Secondary | ICD-10-CM

## 2013-02-04 ENCOUNTER — Ambulatory Visit
Admission: RE | Admit: 2013-02-04 | Discharge: 2013-02-04 | Disposition: A | Discharge status: Home / Self Care | Payer: Medicaid Other | Source: Ambulatory Visit | Attending: Otolaryngology | Admitting: Otolaryngology

## 2013-02-04 DIAGNOSIS — M869 Osteomyelitis, unspecified: Secondary | ICD-10-CM

## 2013-02-04 MED ORDER — GADOBENATE DIMEGLUMINE 529 MG/ML IV SOLN
12.0000 mL | Freq: Once | INTRAVENOUS | Status: AC | PRN
  Administered 2013-02-04: 12 mL via INTRAVENOUS

## 2013-07-28 ENCOUNTER — Emergency Department (INDEPENDENT_AMBULATORY_CARE_PROVIDER_SITE_OTHER)
Admission: EM | Admit: 2013-07-28 | Discharge: 2013-07-28 | Disposition: A | Discharge status: Home / Self Care | Payer: Self-pay | Source: Home / Self Care

## 2013-07-28 ENCOUNTER — Encounter (HOSPITAL_COMMUNITY): Payer: Self-pay | Admitting: Emergency Medicine

## 2013-07-28 DIAGNOSIS — M25511 Pain in right shoulder: Secondary | ICD-10-CM

## 2013-07-28 DIAGNOSIS — M25519 Pain in unspecified shoulder: Secondary | ICD-10-CM

## 2013-07-28 MED ORDER — NAPROXEN 500 MG PO TABS: 500.0000 mg | ORAL_TABLET | Freq: Two times a day (BID) | ORAL | Status: DC

## 2013-07-28 NOTE — ED Provider Notes (Signed)
CSN: [id number]     Arrival date & time 07/28/13  1229 History     None    Chief Complaint  Patient presents with  . Shoulder Problem    collar bone pain x 1 wk.    (Consider location/radiation/quality/duration/timing/severity/associated sxs/prior Treatment) HPI Comments: 52 year old male presents complaining of pain in his medial right clavicle. He has a history of some sort of infection in this area which resulted in destruction of the sternoclavicular joint. Since then, the clavicle is "out." Occasionally, he gets some swelling and soreness in this area that he manages with ice and rest. This happened a few days ago. The swelling is now down, he just needs a note to return to work. He denies any fever, chills, or continued pain in this area. No trauma to the area.   Past Medical History  Diagnosis Date  . Hypertension    Past Surgical History  Procedure Laterality Date  . Back surgery    . Radical neck dissection  04/17/2012    Procedure: RADICAL NECK DISSECTION;  Surgeon: Flo Shanks, MD;  Location: [facility] OR;  Service: ENT;  Laterality: Right;  Right Neck Exploration  . Direct laryngoscopy  04/17/2012    Procedure: DIRECT LARYNGOSCOPY;  Surgeon: Flo Shanks, MD;  Location: [facility] OR;  Service: ENT;  Laterality: Bilateral;  . Rigid esophagoscopy  04/17/2012    Procedure: RIGID ESOPHAGOSCOPY;  Surgeon: Flo Shanks, MD;  Location: [facility] OR;  Service: ENT;  Laterality: Bilateral;  . I&d extremity  04/18/2012    Procedure: IRRIGATION AND DEBRIDEMENT EXTREMITY;  Surgeon: Sharma Covert, MD;  Location: [facility] OR;  Service: Orthopedics;  Laterality: Left;  lt forearm and wrist incicsion and drainage  . Lymph node biopsy  04/24/2012    Procedure: LYMPH NODE BIOPSY;  Surgeon: Flo Shanks, MD;  Location: [facility] OR;  Service: ENT;  Laterality: Right;  Right Neck/Upper Chest Exploration  . I&d extremity  04/24/2012    Procedure: IRRIGATION AND DEBRIDEMENT EXTREMITY;  Surgeon: Sharma Covert, MD;  Location: [facility]  OR;  Service: Orthopedics;  Laterality: Left;  . Total hip arthroplasty  09/18/2012    Procedure: TOTAL HIP ARTHROPLASTY;  Surgeon: Nestor Lewandowsky, MD;  Location: [facility] OR;  Service: Orthopedics;  Laterality: Left;  left total hip arthroplasty   History reviewed. No pertinent family history. History  Substance Use Topics  . Smoking status: Never Smoker   . Smokeless tobacco: Never Used  . Alcohol Use: 1.5 oz/week    3 drink(s) per week     Comment: occassional    Review of Systems  Constitutional: Negative for fever, chills and fatigue.  HENT: Negative for neck pain and neck stiffness.   Eyes: Negative for visual disturbance.  Respiratory: Negative for cough and shortness of breath.   Cardiovascular: Negative for chest pain.  Gastrointestinal: Negative for nausea, vomiting and diarrhea.  Musculoskeletal: Negative for myalgias and arthralgias.       See history of present illness  Skin: Negative for rash.  Neurological: Negative for dizziness, weakness and light-headedness.    Allergies  Review of patient's allergies indicates no known allergies.  Home Medications   Current Outpatient Rx  Name  Route  Sig  Dispense  Refill  . ibuprofen (ADVIL,MOTRIN) 200 MG tablet   Oral   Take 600 mg by mouth every 6 (six) hours as needed. For pain         . naproxen (NAPROSYN) 500 MG tablet   Oral   Take 1 tablet (  500 mg total) by mouth 2 (two) times daily.   60 tablet   2   . oxyCODONE-acetaminophen (PERCOCET/ROXICET) 5-325 MG per tablet   Oral   Take 1-2 tablets by mouth every 6 (six) hours as needed for pain.   15 tablet   0    BP 132/85  Pulse 83  Temp(Src) 98.3 F (36.8 C) (Oral)  SpO2 100% Physical Exam  Nursing note and vitals reviewed. Constitutional: He is oriented to person, place, and time. He appears well-developed and well-nourished. No distress.  HENT:  Head: Normocephalic and atraumatic.  Musculoskeletal:       Right shoulder: He exhibits crepitus and  deformity (medial side of right clavicle protruding anteriorly and medially). He exhibits no tenderness and no swelling.  Neurological: He is alert and oriented to person, place, and time. Coordination normal.  Skin: Skin is warm and dry. No rash noted. He is not diaphoretic.  Psychiatric: He has a normal mood and affect.    ED Course   Procedures (including critical care time)  Labs Reviewed - No data to display No results found. 1. Sternoclavicular joint pain, right     MDM  This patient is back to his baseline and feels fine to go to work. Will prescribe naproxen when necessary. He says he gets his insurance after working for another month and a half, he'll followup with orthopedics at that time   Meds ordered this encounter  Medications  . naproxen (NAPROSYN) 500 MG tablet    Sig: Take 1 tablet (500 mg total) by mouth 2 (two) times daily.    Dispense:  60 tablet    Refill:  2  \  Graylon Good, PA-C 07/28/13 1340

## 2013-07-28 NOTE — ED Provider Notes (Signed)
Medical screening examination/treatment/procedure(s) were performed by non-physician practitioner and as supervising physician I was immediately available for consultation/collaboration.  Leslee Home, M.D.  Reuben Likes, MD 07/28/13 2111

## 2013-07-28 NOTE — ED Notes (Signed)
Pt is c/o right collar bone pain x 1 wk. Pt states that it is the result of an infection that he had. Pt denies fever.  There was some swelling but swelling has gone down a lot per pt.  Pt has been using compresses with mild relief.

## 2013-10-30 ENCOUNTER — Other Ambulatory Visit: Payer: Self-pay

## 2014-07-18 ENCOUNTER — Encounter (HOSPITAL_COMMUNITY): Payer: Self-pay | Admitting: Emergency Medicine

## 2014-07-18 ENCOUNTER — Emergency Department (HOSPITAL_COMMUNITY)
Admission: EM | Admit: 2014-07-18 | Discharge: 2014-07-18 | Disposition: A | Discharge status: Home / Self Care | Payer: Self-pay | Attending: Emergency Medicine | Admitting: Emergency Medicine

## 2014-07-18 DIAGNOSIS — I10 Essential (primary) hypertension: Secondary | ICD-10-CM | POA: Insufficient documentation

## 2014-07-18 DIAGNOSIS — Y92410 Unspecified street and highway as the place of occurrence of the external cause: Secondary | ICD-10-CM | POA: Insufficient documentation

## 2014-07-18 DIAGNOSIS — IMO0002 Reserved for concepts with insufficient information to code with codable children: Secondary | ICD-10-CM | POA: Insufficient documentation

## 2014-07-18 DIAGNOSIS — M545 Low back pain, unspecified: Secondary | ICD-10-CM

## 2014-07-18 DIAGNOSIS — Y9389 Activity, other specified: Secondary | ICD-10-CM | POA: Insufficient documentation

## 2014-07-18 DIAGNOSIS — Z9889 Other specified postprocedural states: Secondary | ICD-10-CM | POA: Insufficient documentation

## 2014-07-18 MED ORDER — METHOCARBAMOL 500 MG PO TABS: 500.0000 mg | ORAL_TABLET | Freq: Two times a day (BID) | ORAL | Status: DC

## 2014-07-18 MED ORDER — NAPROXEN 500 MG PO TABS: 500.0000 mg | ORAL_TABLET | Freq: Two times a day (BID) | ORAL | Status: DC

## 2014-07-18 MED ORDER — METHOCARBAMOL 500 MG PO TABS
500.0000 mg | ORAL_TABLET | Freq: Once | ORAL | Status: AC
  Administered 2014-07-18: 500 mg via ORAL
  Filled 2014-07-18: qty 1

## 2014-07-18 NOTE — ED Provider Notes (Signed)
Medical screening examination/treatment/procedure(s) were performed by non-physician practitioner and as supervising physician I was immediately available for consultation/collaboration.   EKG Interpretation None        Verneta Hamidi W Malaga Jon, MD 07/18/14 1600 
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
## 2014-07-18 NOTE — ED Notes (Signed)
Declined W/C at D/C and was escorted to lobby by RN. 

## 2014-07-26 ENCOUNTER — Encounter (HOSPITAL_COMMUNITY): Payer: Self-pay | Admitting: Emergency Medicine

## 2014-07-26 ENCOUNTER — Emergency Department (INDEPENDENT_AMBULATORY_CARE_PROVIDER_SITE_OTHER)
Admission: EM | Admit: 2014-07-26 | Discharge: 2014-07-26 | Disposition: A | Discharge status: Home / Self Care | Payer: Self-pay | Source: Home / Self Care

## 2014-07-26 DIAGNOSIS — M549 Dorsalgia, unspecified: Secondary | ICD-10-CM

## 2014-07-26 DIAGNOSIS — Z041 Encounter for examination and observation following transport accident: Secondary | ICD-10-CM
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
## 2014-07-26 NOTE — Discharge Instructions (Signed)
Return as needed

## 2016-01-11 ENCOUNTER — Encounter (HOSPITAL_COMMUNITY): Payer: Self-pay | Admitting: Emergency Medicine

## 2016-01-11 ENCOUNTER — Emergency Department (HOSPITAL_COMMUNITY)
Admission: EM | Admit: 2016-01-11 | Discharge: 2016-01-11 | Disposition: A | Discharge status: Home / Self Care | Payer: Worker's Compensation | Attending: Emergency Medicine | Admitting: Emergency Medicine

## 2016-01-11 DIAGNOSIS — I10 Essential (primary) hypertension: Secondary | ICD-10-CM | POA: Insufficient documentation

## 2016-01-11 DIAGNOSIS — Z5189 Encounter for other specified aftercare: Secondary | ICD-10-CM

## 2016-01-11 DIAGNOSIS — Z4801 Encounter for change or removal of surgical wound dressing: Secondary | ICD-10-CM | POA: Diagnosis present

## 2016-01-11 DIAGNOSIS — Z79899 Other long term (current) drug therapy: Secondary | ICD-10-CM | POA: Diagnosis not present

## 2016-01-11 MED ORDER — LIDOCAINE-EPINEPHRINE-TETRACAINE (LET) SOLUTION
3.0000 mL | Freq: Once | NASAL | Status: AC
  Administered 2016-01-11: 3 mL via TOPICAL
  Filled 2016-01-11: qty 3

## 2016-01-11 MED ORDER — BACITRACIN ZINC 500 UNIT/GM EX OINT: TOPICAL_OINTMENT | Freq: Two times a day (BID) | CUTANEOUS | Status: DC

## 2016-01-11 MED ORDER — NAPROXEN 500 MG PO TABS: 500.0000 mg | ORAL_TABLET | Freq: Two times a day (BID) | ORAL | Status: DC

## 2016-01-11 NOTE — ED Provider Notes (Signed)
CSN: [id number]     Arrival date & time 01/11/16  1114 History  By signing my name below, I, Jarvis Morgan, attest that this documentation has been prepared under the direction and in the presence of Kerrie Buffalo, NP  Electronically Signed: Jarvis Morgan, ED Scribe. 01/13/2016. 12:01 AM.   Chief Complaint  Patient presents with  . Wound Infection   Patient is a 55 y.o. male presenting with wound check. The history is provided by the patient. No language interpreter was used.  Wound Check This is a new problem. The current episode started more than 1 week ago. The problem occurs rarely. The problem has not changed since onset.Nothing aggravates the symptoms. Nothing relieves the symptoms. He has tried nothing for the symptoms.    HPI Comments: [name] is a 55 y.o. male who presents to the Emergency Department for evaluation of a possible wound infection to his chest. Pt reports associated swelling and pain to the area. Pt previously had 3 sutures removed from the area 6 days ago. He endorses that he was not put on any antibiotics. He has not tried any treatments prior to arrival. Pt denies any drainage from the area. Pt's tetanus vaccination is UTD. Pt denies any h/o keloids. He denies any fevers, chills, nausea, vomiting or other associated symptoms.    Past Medical History  Diagnosis Date  . Hypertension    Past Surgical History  Procedure Laterality Date  . Back surgery    . Radical neck dissection  04/17/2012    Procedure: RADICAL NECK DISSECTION;  Surgeon: Flo Shanks, MD;  [facility];  Service: ENT;  Laterality: Right;  Right Neck Exploration  . Direct laryngoscopy  04/17/2012    Procedure: DIRECT LARYNGOSCOPY;  Surgeon: Flo Shanks, MD;  Location: [facility] OR;  Service: ENT;  Laterality: Bilateral;  . Rigid esophagoscopy  04/17/2012    Procedure: RIGID ESOPHAGOSCOPY;  Surgeon: Flo Shanks, MD;  [facility]: [facility];  Service: ENT;  Laterality: Bilateral;  . I&d extremity   04/18/2012    Procedure: IRRIGATION AND DEBRIDEMENT EXTREMITY;  Surgeon: Sharma Covert, MD;  Location: [facility] OR;  Service: Orthopedics;  Laterality: Left;  lt forearm and wrist incicsion and drainage  . Lymph node biopsy  04/24/2012    Procedure: LYMPH NODE BIOPSY;  Surgeon: Flo Shanks, MD;  [facility];  Service: ENT;  Laterality: Right;  Right Neck/Upper Chest Exploration  . I&d extremity  04/24/2012    Procedure: IRRIGATION AND DEBRIDEMENT EXTREMITY;  Surgeon: Sharma Covert, MD;  Location: [facility] OR;  Service: Orthopedics;  Laterality: Left;  . Total hip arthroplasty  09/18/2012    Procedure: TOTAL HIP ARTHROPLASTY;  Surgeon: Nestor Lewandowsky, MD;  Location: [facility] OR;  Service: Orthopedics;  Laterality: Left;  left total hip arthroplasty   History reviewed. No pertinent family history. Social History  Substance Use Topics  . Smoking status: Never Smoker   . Smokeless tobacco: Never Used  . Alcohol Use: 1.5 oz/week    3 drink(s) per week     Comment: occassional    Review of Systems  Constitutional: Negative for fever and chills.  Gastrointestinal: Negative for nausea and vomiting.  Skin: Positive for wound. Negative for color change.  All other systems reviewed and are negative.     Allergies  Review of patient's allergies indicates no known allergies.  Home Medications   Prior to Admission medications   Medication Sig Start Date End Date Taking? Authorizing Provider  methocarbamol (ROBAXIN) 500  MG tablet Take 1 tablet (500 mg total) by mouth 2 (two) times daily. 07/18/14   Jennifer Piepenbrink, PA-C  naproxen (NAPROSYN) 500 MG tablet Take 1 tablet (500 mg total) by mouth 2 (two) times daily. 01/11/16   Emilly Lavey Orlene Och, NP   BP 139/93 mmHg  Pulse 67  Temp(Src) 98 F (36.7 C) (Oral)  Resp 18  SpO2 99% Physical Exam  Constitutional: He is oriented to person, place, and time. He appears well-developed and well-nourished.  Eyes: EOM are normal.  Neck: Neck supple.  Pulmonary/Chest:  Effort normal.  Abdominal: Soft. There is no tenderness.  Musculoskeletal: Normal range of motion.  Neurological: He is alert and oriented to person, place, and time. No cranial nerve deficit.  Skin: Skin is warm and dry.  Healing wound with raised tender area, no erythema, no drainage or red streaking  Nursing note and vitals reviewed.   ED Course  Procedures (including critical care time)  DIAGNOSTIC STUDIES: Oxygen Saturation is 100% on RA, normal by my interpretation.    COORDINATION OF CARE: 1:19 PM-Will I&D area per pt request, pt believes the area to be infected. Pt advised of plan for treatment and pt agrees. LET applied to the area Using 18 G needle tried to aspirate any fluid from the area, no drainage obtained    MDM  55 y.o. male with tender wound area to the chest after having sutures removed last week. Stable for d/c without signs of infection. He will apply warm wet compresses to the area and take NSAIDS for pain.    Final diagnoses:  Visit for wound check   I personally performed the services described in this documentation, which was scribed in my presence. The recorded information has been reviewed and is accurate.     [address] [place], NP 01/13/16 0004  Lyndal Pulley, MD 01/13/16 [phone]

## 2016-01-11 NOTE — ED Notes (Signed)
Pt sts had sutures removed from wound on chest last Wednesday and now having swelling and pain

## 2016-05-16 ENCOUNTER — Encounter (HOSPITAL_COMMUNITY): Payer: Self-pay | Admitting: *Deleted

## 2016-05-16 ENCOUNTER — Emergency Department (HOSPITAL_COMMUNITY)
Admission: EM | Admit: 2016-05-16 | Discharge: 2016-05-16 | Disposition: A | Discharge status: Home / Self Care | Payer: No Typology Code available for payment source | Attending: Emergency Medicine | Admitting: Emergency Medicine

## 2016-05-16 DIAGNOSIS — Y9289 Other specified places as the place of occurrence of the external cause: Secondary | ICD-10-CM | POA: Insufficient documentation

## 2016-05-16 DIAGNOSIS — S0011XA Contusion of right eyelid and periocular area, initial encounter: Secondary | ICD-10-CM | POA: Insufficient documentation

## 2016-05-16 DIAGNOSIS — Y998 Other external cause status: Secondary | ICD-10-CM | POA: Insufficient documentation

## 2016-05-16 DIAGNOSIS — S0264XA Fracture of ramus of mandible, initial encounter for closed fracture: Secondary | ICD-10-CM

## 2016-05-16 DIAGNOSIS — S02641A Fracture of ramus of right mandible, initial encounter for closed fracture: Secondary | ICD-10-CM | POA: Insufficient documentation

## 2016-05-16 DIAGNOSIS — I10 Essential (primary) hypertension: Secondary | ICD-10-CM

## 2016-05-16 DIAGNOSIS — Y9389 Activity, other specified: Secondary | ICD-10-CM | POA: Insufficient documentation

## 2016-05-16 DIAGNOSIS — H1131 Conjunctival hemorrhage, right eye: Secondary | ICD-10-CM

## 2016-05-16 DIAGNOSIS — S022XXA Fracture of nasal bones, initial encounter for closed fracture: Secondary | ICD-10-CM

## 2016-05-16 MED ORDER — TETRACAINE HCL 0.5 % OP SOLN
1.0000 [drp] | Freq: Once | OPHTHALMIC | Status: AC
  Administered 2016-05-16: 1 [drp] via OPHTHALMIC
  Filled 2016-05-16: qty 2

## 2016-05-16 MED ORDER — ONDANSETRON HCL 4 MG/2ML IJ SOLN
4.0000 mg | Freq: Once | INTRAMUSCULAR | Status: AC
  Administered 2016-05-16: 4 mg via INTRAVENOUS
  Filled 2016-05-16: qty 2

## 2016-05-16 MED ORDER — HYDROMORPHONE HCL 1 MG/ML IJ SOLN
1.0000 mg | Freq: Once | INTRAMUSCULAR | Status: AC
  Administered 2016-05-16: 1 mg via INTRAVENOUS
  Filled 2016-05-16: qty 1

## 2016-05-16 MED ORDER — OXYCODONE-ACETAMINOPHEN 5-325 MG PO TABS: 1.0000 | ORAL_TABLET | Freq: Four times a day (QID) | ORAL | Status: DC | PRN

## 2016-05-16 MED ORDER — NAPROXEN 375 MG PO TABS: 375.0000 mg | ORAL_TABLET | Freq: Two times a day (BID) | ORAL | Status: DC
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
## 2016-05-16 NOTE — ED Notes (Signed)
The pt was seen at [facility] [place]  He is here because they did not do anything for his facial fractures he is also in pain

## 2016-05-16 NOTE — Discharge Instructions (Signed)
Fractured-Jaw Meal Plan The purpose of the fractured-jaw meal plan is to provide foods that can be easily blended and easily swallowed. This plan is typically used after jaw or mouth surgery, wired jaw surgery, or dental surgery. Foods in this plan need to be blended so that they can be sipped from a straw or given through a syringe. You should try to have at least three meals and three snacks daily. It is important to make sure you get enough calories and protein to prevent weight loss and help your body heal, especially after surgery. You may wish to include a liquid multivitamin in your plan to ensure that you get all the vitamins and minerals you need. Ask your health care provider for a recommendation.  HOW DO I PREPARE MY MEALS? All foods in this plan must be blended. Avoid nuts, seeds, skins, peels, bones, or any foods that cannot be blended to the right consistency. Make sure to eat a variety of foods from each food group every day. The following tips can help you as you blend your food:  Remove skins, seeds, and peels from food.  Cook meats and vegetables thoroughly.  Cut foods into small pieces and mix with a small amount of liquid in a food processor or blender. Continue to add liquid until the food becomes thin enough to sip through a straw.  Adding liquids such as juice, milk, cream, broth, gravy, or vegetable juice can help add flavor to foods.  Heat foods after they have been blended to reduce the amount of foam created from blending.  Heat or cool your foods to lukewarm temperatures if your teeth and mouth are sensitive to extreme temperatures. WHAT FOODS CAN I EAT? Make sure to eat a variety of foods from each food group.  Grains  Hot cereals, such as oatmeal, grits, ground wheat cereals, and polenta.  Rice and pasta.  Couscous. Vegetables  All cooked or canned vegetables, without seeds and skins.  Vegetable juices.  Cooked potatoes, without skins. Fruit  Any  cooked or canned fruits, without seeds and skins.  Fresh, peeled soft fruits, such as bananas and peaches, that can be blended until smooth.  All fruit juices, without seeds and skins. Meat and Other Protein Sources  Soft-boiled eggs, scrambled eggs, powdered eggs, pasteurized egg mixtures, and custard.  Ground meats, such as hamburger, [place], sausage, and meatloaf.  Tender, well-cooked meat, poultry, and fish prepared without bones or skin.  Soft soy foods (such as tofu).  Smooth nut butters. Dairy  All are allowed. Beverages  Coffee (regular or decaffeinated), tea, and mineral water. Condiments  All seasonings and condiments that blend well. WHEN MAY I NEED TO SUPPLEMENT MY MEALS? If you begin to lose weight on this plan, you may need to increase the amount of food you are eating or the number of calories in your food or both. You can increase the number of calories by adding any of the following foods:  Protein powder or powdered milk.  Extra fats, such as margarine (without trans fat), sour cream, cream cheese, cream, and nut butters, such as peanut butter or almond butter.  Sweets, such as honey, ice cream, blackstrap molasses, or sugar.   This information is not intended to replace advice given to you by your health care provider. Make sure you discuss any questions you have with your health care provider.   Document Released: 05/31/2010 Document Revised: 01/01/2015 Document Reviewed: 11/07/2013 Elsevier Interactive Patient Education 2016 [employer].  Mandibular  Fracture A mandibular fracture is a break in the jawbone. CAUSES  The most common cause of mandibular fracture is a direct blow (trauma) to the jaw. This could happen from:  A car crash.  Physical violence.  A fall from a high place. SYMPTOMS   Pain.  Swelling.  Difficulty and pain when closing the mouth.  Feeling that the teeth are not aligned properly when closing the mouth  (malocclusion).  Difficulty speaking.  Difficulty swallowing. DIAGNOSIS  Your caregiver will take your history and perform a physical exam. He or she may also order imaging tests, such as X-rays or a computed tomography (CT) scan, to confirm your diagnosis. TREATMENT  Surgery is often needed to put the jaw back in the right position. Wires are usually placed around the teeth to hold the jaw in place while it heals. Treatment may also include pain medicine, ice, and a soft or liquid diet. HOME CARE INSTRUCTIONS   Put ice on the injured area.  Put ice in a plastic bag.  Place a towel between your skin and the bag.  Leave the ice on for 15-20 minutes, 03-04 times a day for the first 2 days.  Only take over-the-counter or prescription medicines for pain, discomfort, or fever as directed by your caregiver.  Eat a well-balanced, high-protein soft or liquid diet as directed by your caregiver.  If your jaws are wired, follow your caregiver's instructions for wired jaw care.  Sleep on your back to avoid putting pressure on your jaw.  Avoid exercising to the point that you become short of breath. SEEK MEDICAL CARE IF:   You have a severe headache or numbness in your face.  You have severe jaw pain that is not relieved with medicine.  Your jaw wires become loose.  You have uncontrollable nausea or anxiety.  Your swelling or redness gets worse. SEEK IMMEDIATE MEDICAL CARE IF:  You have a fever.  You have difficulty breathing.  You feel like your airway is tightening.  You cannot swallow your saliva.  You make a high-pitched whistling sound when you breathe (wheezing). MAKE SURE YOU:   Understand these instructions.  Will watch your condition.  Will get help right away if you are not doing well or get worse.   This information is not intended to replace advice given to you by your health care provider. Make sure you discuss any questions you have with your health care  provider.   Document Released: 12/11/2005 Document Revised: 01/01/2015 Document Reviewed: 12/27/2011 Elsevier Interactive Patient Education 2016 [employer] Inc[employer]  Nasal Fracture A nasal fracture is a break or crack in the bones or cartilage of the nose. Minor breaks do not require treatment. These breaks usually heal on their own after about one month. Serious breaks may require surgery. CAUSES This injury is usually caused by a blunt injury to the nose. This type of injury often occurs from:  Contact sports.  Car accidents.  Falls.  Getting punched. SYMPTOMS Symptoms of this injury include:  Pain.  Swelling of the nose.  Bleeding from the nose.  Bruising around the nose or eyes. This may include having black eyes.  Crooked appearance of the nose. DIAGNOSIS This injury may be diagnosed with a physical exam. The health care provider will gently feel the nose for signs of broken bones. He or she will look inside the nostrils to make sure that there is not a blood-filled swelling on the dividing wall between the nostrils (septal hematoma). X-rays of  the nose may not show a nasal fracture even when one is present. In some cases, X-rays or a CT scan may be done 1-5 days after the injury. Sometimes, the health care provider will want to wait until the swelling has gone down. TREATMENT Often, minor fractures that have caused no deformity do not require treatment. More serious fractures in which bones have moved out of position may require surgery, which will take place after the swelling is gone. Surgery will stabilize and align the fracture. In some cases, a health care provider may be able to reposition the bones without surgery. This may be done in the health care provider's office after medicine is given to numb the area (local anesthetic). HOME CARE INSTRUCTIONS  If directed, apply ice to the injured area:  Put ice in a plastic bag.  Place a towel between your skin and the  bag.  Leave the ice on for 20 minutes, 2-3 times per day.  Take over-the-counter and prescription medicines only as told by your health care provider.  If your nose starts to bleed, sit in an upright position while you squeeze the soft parts of your nose against the dividing wall between your nostrils (septum) for 10 minutes.  Try to avoid blowing your nose.  Return to your normal activities as told by your health care provider. Ask your health care provider what activities are safe for you.  Avoid contact sports for 3-4 weeks or as told by your health care provider.  Keep all follow-up visits as told by your health care provider. This is important. SEEK MEDICAL CARE IF:  Your pain increases or becomes severe.  You continue to have nosebleeds.  The shape of your nose does not return to normal within 5 days.  You have pus draining out of your nose. SEEK IMMEDIATE MEDICAL CARE IF:  You have bleeding from your nose that does not stop after you pinch your nostrils closed for 20 minutes and keep ice on your nose.  You have clear fluid draining out of your nose.  You notice a grape-like swelling on the septum. This swelling is a collection of blood (hematoma) that must be drained to help prevent infection.  You have difficulty moving your eyes.  You have repeated vomiting.   This information is not intended to replace advice given to you by your health care provider. Make sure you discuss any questions you have with your health care provider.   Document Released: 12/08/2000 Document Revised: 09/01/2015 Document Reviewed: 01/18/2015 Elsevier Interactive Patient Education 2016 Elsevier Inc.  Subconjunctival Hemorrhage Subconjunctival hemorrhage is bleeding that happens between the white part of your eye (sclera) and the clear membrane that covers the outside of your eye (conjunctiva). There are many tiny blood vessels near the surface of your eye. A subconjunctival hemorrhage  happens when one or more of these vessels breaks and bleeds, causing a red patch to appear on your eye. This is similar to a bruise. Depending on the amount of bleeding, the red patch may only cover a small area of your eye or it may cover the entire visible part of the sclera. If a lot of blood collects under the conjunctiva, there may also be swelling. Subconjunctival hemorrhages do not affect your vision or cause pain, but your eye may feel irritated if there is swelling. Subconjunctival hemorrhages usually do not require treatment, and they disappear on their own within two weeks. CAUSES This condition may be caused by:  Mild trauma, such  as rubbing your eye too hard.  Severe trauma or blunt injuries.  Coughing, sneezing, or vomiting.  Straining, such as when lifting a heavy object.  High blood pressure.  Recent eye surgery.  A history of diabetes.  Certain medicines, especially blood thinners (anticoagulants).  Other conditions, such as eye tumors, bleeding disorders, or blood vessel abnormalities. Subconjunctival hemorrhages can happen without an obvious cause.  SYMPTOMS  Symptoms of this condition include:  A bright red or dark red patch on the white part of the eye.  The red area may spread out to cover a larger area of the eye before it goes away.  The red area may turn brownish-yellow before it goes away.  Swelling.  Mild eye irritation. DIAGNOSIS This condition is diagnosed with a physical exam. If your subconjunctival hemorrhage was caused by trauma, your health care provider may refer you to an eye specialist (ophthalmologist) or another specialist to check for other injuries. You may have other tests, including:  An eye exam.  A blood pressure check.  Blood tests to check for bleeding disorders. If your subconjunctival hemorrhage was caused by trauma, X-rays or a CT scan may be done to check for other injuries. TREATMENT Usually, no treatment is needed. Your  health care provider may recommend eye drops or cold compresses to help with discomfort. HOME CARE INSTRUCTIONS  Take over-the-counter and prescription medicines only as directed by your health care provider.  Use eye drops or cold compresses to help with discomfort as directed by your health care provider.  Avoid activities, things, and environments that may irritate or injure your eye.  Keep all follow-up visits as told by your health care provider. This is important. SEEK MEDICAL CARE IF:  You have pain in your eye.  The bleeding does not go away within 3 weeks.  You keep getting new subconjunctival hemorrhages. SEEK IMMEDIATE MEDICAL CARE IF:  Your vision changes or you have difficulty seeing.  You suddenly develop severe sensitivity to light.  You develop a severe headache, persistent vomiting, confusion, or abnormal tiredness (lethargy).  Your eye seems to bulge or protrude from your eye socket.  You develop unexplained bruises on your body.  You have unexplained bleeding in another area of your body.   This information is not intended to replace advice given to you by your health care provider. Make sure you discuss any questions you have with your health care provider.   Document Released: 12/11/2005 Document Revised: 09/01/2015 Document Reviewed: 02/17/2015 Elsevier Interactive Patient Education [employer]
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
## 2018-05-24 ENCOUNTER — Emergency Department (HOSPITAL_COMMUNITY)
Admission: EM | Admit: 2018-05-24 | Discharge: 2018-05-24 | Disposition: A | Discharge status: Home / Self Care | Payer: Self-pay | Attending: Emergency Medicine | Admitting: Emergency Medicine

## 2018-05-24 ENCOUNTER — Encounter (HOSPITAL_COMMUNITY): Payer: Self-pay

## 2018-05-24 ENCOUNTER — Other Ambulatory Visit: Payer: Self-pay

## 2018-05-24 DIAGNOSIS — M542 Cervicalgia: Secondary | ICD-10-CM | POA: Insufficient documentation

## 2018-05-24 DIAGNOSIS — J029 Acute pharyngitis, unspecified: Secondary | ICD-10-CM | POA: Insufficient documentation

## 2018-05-24 DIAGNOSIS — I10 Essential (primary) hypertension: Secondary | ICD-10-CM | POA: Insufficient documentation

## 2018-05-24 DIAGNOSIS — Z7982 Long term (current) use of aspirin: Secondary | ICD-10-CM | POA: Insufficient documentation

## 2018-05-24 LAB — GROUP A STREP BY PCR: Group A Strep by PCR: NOT DETECTED

## 2018-05-24 MED ORDER — NAPROXEN 500 MG PO TABS: 500.0000 mg | ORAL_TABLET | Freq: Two times a day (BID) | ORAL | 0 refills | Status: DC

## 2018-05-24 MED ORDER — METHOCARBAMOL 500 MG PO TABS: 500.0000 mg | ORAL_TABLET | Freq: Three times a day (TID) | ORAL | 0 refills | Status: DC | PRN

## 2018-05-24 NOTE — ED Notes (Signed)
Pt verbalized understanding discharge instructions and denies any further needs or questions at this time. VS stable, ambulatory and steady gait.   

## 2018-05-24 NOTE — ED Triage Notes (Signed)
Patient complains of 2 days of sore throat x 2 days after visiting family with strep. Also reports the neck pain increased with ROM, NAD

## 2018-05-24 NOTE — Discharge Instructions (Addendum)
You were seen in the emergency department today for neck pain and a sore throat.  We suspect her neck pain to be related to muscle problems.  We are sending you home with naproxen and Robaxin to treat this.   Naproxen is a nonsteroidal anti-inflammatory medication that will help with pain and swelling. Be sure to take this medication as prescribed with food, 1 pill every 12 hours,  It should be taken with food, as it can cause stomach upset, and more seriously, stomach bleeding. Do not take other nonsteroidal anti-inflammatory medications with this such as Advil, Motrin, or Aleve.   Robaxin is the muscle relaxer I have prescribed, this is meant to help with muscle tightness. Be aware that this medication may make you drowsy therefore the first time you take this it should be at a time you are in an environment where you can rest. Do not drive or operate heavy machinery when taking this medication.    We have prescribed you new medication(s) today. Discuss the medications prescribed today with your pharmacist as they can have adverse effects and interactions with your other medicines including over the counter and prescribed medications. Seek medical evaluation if you start to experience new or abnormal symptoms after taking one of these medicines, seek care immediately if you start to experience difficulty breathing, feeling of your throat closing, facial swelling, or rash as these could be indications of a more serious allergic reaction  You may take Tylenol per over-the-counter dosing safely with these medicines.  Additionally asked the pharmacist about topical lidocaine patches, these are patches you can place directly over your areas of pain.  Regarding your sore throat, your strep test was negative, we suspect this to be viral at this time.  The naproxen will help your sore throat.  Additionally your blood pressure was elevated in the emergency department today, you will need to have this  rechecked.  Follow-up with your primary care provider in 1 week for reevaluation and blood pressure recheck.  Return to the ER for new or worsening symptoms or any other concerns that you may have.

## 2018-05-24 NOTE — ED Provider Notes (Signed)
[facility] Provider Note   CSN: [id number] Arrival date & time: 05/24/18  0827     History   Chief Complaint Chief Complaint  Patient presents with  . Sore Throat   Neck pain  HPI [name] is a 57 y.o. male with a hx of HTN who presents to the ED with complaints of neck pain x 4 days. Patient describes pain as being to bilateral posterior neck. States it feels like a spasm. Rates pain a 9/10 in severity, worse with movement, no alleviating factors. Tried icy hot, warm and cool compresses, and different positions without relief. Denies numbness, weakness, or change in vision. No change in activity, heavy lifting, or traumatic injury recently. Also mentions sore throat intermittent x 5 days, improving, recent sick contact with grandson with strep. Has had congestion. Denies fevers.   HPI  Past Medical History:  Diagnosis Date  . Hypertension     Patient Active Problem List   Diagnosis Date Noted  . Hip pain, left 09/15/2012    Past Surgical History:  Procedure Laterality Date  . BACK SURGERY    . DIRECT LARYNGOSCOPY  04/17/2012   Procedure: DIRECT LARYNGOSCOPY;  Surgeon: Flo ShanksKarol Wolicki, MD;  [facility];  Service: ENT;  Laterality: Bilateral;  . I&D EXTREMITY  04/18/2012   Procedure: IRRIGATION AND DEBRIDEMENT EXTREMITY;  Surgeon: Sharma CovertFred W Ortmann, MD;  Location: [facility] OR;  Service: Orthopedics;  Laterality: Left;  lt forearm and wrist incicsion and drainage  . I&D EXTREMITY  04/24/2012   Procedure: IRRIGATION AND DEBRIDEMENT EXTREMITY;  Surgeon: Sharma CovertFred W Ortmann, MD;  Location: [facility] OR;  Service: Orthopedics;  Laterality: Left;  . LYMPH NODE BIOPSY  04/24/2012   Procedure: LYMPH NODE BIOPSY;  Surgeon: Flo ShanksKarol Wolicki, MD;  [facility];  Service: ENT;  Laterality: Right;  Right Neck/Upper Chest Exploration  . RADICAL NECK DISSECTION  04/17/2012   Procedure: RADICAL NECK DISSECTION;  Surgeon: Flo ShanksKarol Wolicki, MD;  [facility];  Service:  ENT;  Laterality: Right;  Right Neck Exploration  . RIGID ESOPHAGOSCOPY  04/17/2012   Procedure: RIGID ESOPHAGOSCOPY;  Surgeon: Flo ShanksKarol Wolicki, MD;  [facility];  Service: ENT;  Laterality: Bilateral;  . TOTAL HIP ARTHROPLASTY  09/18/2012   Procedure: TOTAL HIP ARTHROPLASTY;  Surgeon: Nestor LewandowskyFrank J Rowan, MD;  Location: [facility] OR;  Service: Orthopedics;  Laterality: Left;  left total hip arthroplasty        Home Medications    Prior to Admission medications   Medication Sig Start Date End Date Taking? Authorizing Provider  Aspirin-Salicylamide-Caffeine (BC HEADACHE POWDER PO) Take 1 Package by mouth as needed (pain).   Yes [provider]  methocarbamol (ROBAXIN) 500 MG tablet Take 1 tablet (500 mg total) by mouth 2 (two) times daily. Patient not taking: Reported on 05/24/2018 07/18/14   Piepenbrink, Victorino DikeJennifer, PA-C  naproxen (NAPROSYN) 375 MG tablet Take 1 tablet (375 mg total) by mouth 2 (two) times daily. Patient not taking: Reported on 05/24/2018 05/16/16   Arthor CaptainHarris, Abigail, PA-C  oxyCODONE-acetaminophen (PERCOCET) 5-325 MG tablet Take 1-2 tablets by mouth every 6 (six) hours as needed. Patient not taking: Reported on 05/24/2018 05/16/16   Arthor CaptainHarris, Abigail, PA-C    Family History No family history on file.  Social History Social History   Tobacco Use  . Smoking status: Never Smoker  . Smokeless tobacco: Never Used  Substance Use Topics  . Alcohol use: Yes    Alcohol/week: 1.5 oz    Types: 3  drink(s) per week    Comment: occassional  . Drug use: No     Allergies   Patient has no known allergies.   Review of Systems Review of Systems  Constitutional: Negative for chills and fever.  HENT: Positive for congestion and sore throat. Negative for ear pain.   Eyes: Negative for visual disturbance.  Respiratory: Negative for cough and shortness of breath.   Cardiovascular: Negative for chest pain.  Gastrointestinal: Negative for abdominal pain.  Musculoskeletal: Positive for  neck pain.  Neurological: Negative for weakness and numbness.     Physical Exam Updated Vital Signs BP (!) 164/116   Pulse 91   Temp 98.6 F (37 C) (Oral)   Resp (!) 100   SpO2 100%   Physical Exam  Constitutional: He appears well-developed and well-nourished.  Non-toxic appearance. No distress.  HENT:  Head: Normocephalic and atraumatic.  Right Ear: Tympanic membrane is not perforated, not erythematous, not retracted and not bulging.  Left Ear: Tympanic membrane is not perforated, not erythematous, not retracted and not bulging.  Nose: Mucosal edema (congestion) present. Right sinus exhibits no maxillary sinus tenderness and no frontal sinus tenderness. Left sinus exhibits no maxillary sinus tenderness and no frontal sinus tenderness.  Mouth/Throat: Uvula is midline. Posterior oropharyngeal erythema present. No oropharyngeal exudate.  Patient is tolerating his own secretions without difficulty.  No trismus.  No drooling.  No hot potato voice.  Submandibular compartment is soft.  Eyes: Conjunctivae are normal. Right eye exhibits no discharge. Left eye exhibits no discharge.  Neck: Neck [name]. Spinous process tenderness (Diffuse, nonfocal) and muscular tenderness (Bilateral, prominent to trapezius muscles.) present.  No obvious deformity, appreciable swelling, erythema, or ecchymosis. Patient has full range of motion with flexion and extension of the neck, he has some limitation in lateral movement which he attributes secondary to pain, he is able to minimally move in each of these directions.  Cardiovascular: Normal rate and regular rhythm.  No murmur heard. Pulmonary/Chest: Breath sounds normal. No respiratory distress. He has no wheezes. He has no rales.  Abdominal: Soft. He exhibits no distension. There is no tenderness.  Lymphadenopathy:    He has no cervical adenopathy.  Neurological: He is alert.  Clear speech.  Patient has 5 out of 5 symmetric grip strength.  Sensation  grossly intact bilateral upper extremities.  Able to perform okay sign, thumbs up, and cross second and third digits bilaterally.   Skin: Skin is warm and dry. No rash noted.  Psychiatric: He has a normal mood and affect. His behavior is normal. Thought content normal.  Nursing note and vitals reviewed.    ED Treatments / Results  Labs Results for orders placed or performed during the hospital encounter of 05/24/18  Group A Strep by PCR  Result Value Ref Range   Group A Strep by PCR NOT DETECTED NOT DETECTED   No results found.  EKG None  Radiology No results found.  Procedures Procedures (including critical care time)  Medications Ordered in ED Medications - No data to display   Initial Impression / Assessment and Plan / ED Course  I have reviewed the triage vital signs and the nursing notes.  Pertinent labs & imaging results that were available during my care of the patient were reviewed by me and considered in my medical decision making (see chart for details).   Patient presents with complaints of neck pain and sore throat.  Patient nontoxic-appearing, no apparent distress, vitals WNL with the exception of elevated  blood pressure, patient is has a hx of HTN and is not currently on antihypertensives, doubt HTN emergency, discussed need for recheck.  Regarding patient's neck pain-patient with palpable bilateral muscle tenderness and muscle spasms posteriorly, with mild limitation in range of motion, there is no nuchal rigidity, no overlying deformities.  Suspect muscle related soreness/spasm, will treat with naproxen and Robaxin, instructed patient he is not to drive or operate heavy machinery when taking Robaxin.  Recommended lidocaine patches.  Patient with additional mention of sore throat, strep screen negative, no evidence of RPA/PTA on exam. I do not feel that the sore throat and neck pain are related, seem to be separate processes at this time. Suspect viral pharyngitis at  this time. I discussed results, treatment plan, need for PCP follow-up, and return precautions with the patient. Provided opportunity for questions, patient confirmed understanding and is in agreement with plan.   Vitals:   05/24/18 1136 05/24/18 1137  BP: (!) 155/98 (!) 139/100  Pulse: 86   Resp: 18   Temp: 98.5 F (36.9 C)   SpO2: 99%    Final Clinical Impressions(s) / ED Diagnoses   Final diagnoses:  Sore throat  Neck pain    ED Discharge Orders        Ordered    naproxen (NAPROSYN) 500 MG tablet  2 times daily     05/24/18 1131    methocarbamol (ROBAXIN) 500 MG tablet  Every 8 hours PRN     05/24/18 1131       Raffaele Derise, [place] R, PA-C 05/24/18 1425    Arby Barrette, MD 05/31/18 1538

## 2018-12-10 ENCOUNTER — Emergency Department (HOSPITAL_COMMUNITY)
Admission: EM | Admit: 2018-12-10 | Discharge: 2018-12-10 | Disposition: A | Discharge status: Home / Self Care | Payer: Self-pay | Attending: Emergency Medicine | Admitting: Emergency Medicine

## 2018-12-10 ENCOUNTER — Encounter (HOSPITAL_COMMUNITY): Payer: Self-pay | Admitting: *Deleted

## 2018-12-10 ENCOUNTER — Emergency Department (HOSPITAL_COMMUNITY): Payer: Self-pay

## 2018-12-10 ENCOUNTER — Other Ambulatory Visit: Payer: Self-pay

## 2018-12-10 DIAGNOSIS — M5412 Radiculopathy, cervical region: Secondary | ICD-10-CM | POA: Insufficient documentation

## 2018-12-10 DIAGNOSIS — M542 Cervicalgia: Secondary | ICD-10-CM

## 2018-12-10 DIAGNOSIS — Z96642 Presence of left artificial hip joint: Secondary | ICD-10-CM | POA: Insufficient documentation

## 2018-12-10 DIAGNOSIS — W228XXA Striking against or struck by other objects, initial encounter: Secondary | ICD-10-CM | POA: Insufficient documentation

## 2018-12-10 DIAGNOSIS — I10 Essential (primary) hypertension: Secondary | ICD-10-CM | POA: Insufficient documentation

## 2018-12-10 LAB — COMPREHENSIVE METABOLIC PANEL
ALT: 15 U/L (ref 0–44)
AST: 38 U/L (ref 15–41)
Albumin: 4 g/dL (ref 3.5–5.0)
Alkaline Phosphatase: 71 U/L (ref 38–126)
Anion gap: 12 (ref 5–15)
BILIRUBIN TOTAL: 0.5 mg/dL (ref 0.3–1.2)
BUN: 8 mg/dL (ref 6–20)
CO2: 22 mmol/L (ref 22–32)
Calcium: 9.5 mg/dL (ref 8.9–10.3)
Chloride: 106 mmol/L (ref 98–111)
Creatinine, Ser: 1.11 mg/dL (ref 0.61–1.24)
Glucose, Bld: 84 mg/dL (ref 70–99)
POTASSIUM: 4.2 mmol/L (ref 3.5–5.1)
Sodium: 140 mmol/L (ref 135–145)
TOTAL PROTEIN: 8.1 g/dL (ref 6.5–8.1)

## 2018-12-10 LAB — CBC
HEMATOCRIT: 46.8 % (ref 39.0–52.0)
Hemoglobin: 13.9 g/dL (ref 13.0–17.0)
MCH: 21.9 pg — AB (ref 26.0–34.0)
MCHC: 29.7 g/dL — AB (ref 30.0–36.0)
MCV: 73.6 fL — AB (ref 80.0–100.0)
NRBC: 0 % (ref 0.0–0.2)
Platelets: 262 10*3/uL (ref 150–400)
RBC: 6.36 MIL/uL — ABNORMAL HIGH (ref 4.22–5.81)
RDW: 18.2 % — AB (ref 11.5–15.5)
WBC: 4.5 10*3/uL (ref 4.0–10.5)

## 2018-12-10 MED ORDER — OXYCODONE HCL 5 MG PO TABS
5.0000 mg | ORAL_TABLET | Freq: Once | ORAL | Status: AC
  Administered 2018-12-10: 5 mg via ORAL
  Filled 2018-12-10: qty 1

## 2018-12-10 MED ORDER — PREDNISONE 10 MG PO TABS: ORAL_TABLET | ORAL | 0 refills | Status: DC

## 2018-12-10 NOTE — ED Notes (Signed)
Patient transported to MRI 

## 2018-12-10 NOTE — Discharge Instructions (Addendum)
You were evaluated in the Emergency Department and after careful evaluation, we did not find any emergent condition requiring admission or further testing in the hospital.  Your symptoms today seem to be due to cervical radiculopathy, meaning that there are some bulging disks in your neck that are causing compression and inflammation of the nerves.  Your MRI today did not reveal any surgical emergencies, but it is very important that you follow-up with neurosurgery for further management.  If your numbness or weakness were to become worse, or you were to experience trouble urinating or loss of control of your bowel movements, it would be very important to return to the emergency department.  Please return to the Emergency Department if you experience any worsening of your condition.  We encourage you to follow up with a primary care provider.  Thank you for allowing us to be a part of your care.

## 2018-12-10 NOTE — ED Triage Notes (Signed)
Pt in c/o neck pain intermittent since injury in October 2019 with radiating numbness to R hand, pt c/o intermittent headache, pt denies bowel & bladder incontinence, A&O x4

## 2018-12-10 NOTE — ED Provider Notes (Signed)
[facility] Department Provider Note MRN:  [id number]  Arrival date & time: 12/10/18     Chief Complaint   Neck Pain   History of Present Illness   [name] is a 57 y.o. year-old male with a history of hypertension presenting to the ED with chief complaint of neck pain.  2 months ago, patient was at work when a piece of machinery struck his head, causing his neck to bend completely to the left.  Since that time, he has had progressively worsening pain to the neck, as well as progressively worsening numbness and weakness to the right arm.  Right hand is now mostly numb, decreased grip strength.  Denies fever, no IV drug use, no chest pain or shortness of breath, no numbness or weakness to the rest of the body.  Symptoms are constant, no exacerbating relieving factors.  Review of Systems  A complete 10 system review of systems was obtained and all systems are negative except as noted in the HPI and PMH.   Patient's Health History    Past Medical History:  Diagnosis Date  . Hypertension     Past Surgical History:  Procedure Laterality Date  . BACK SURGERY    . DIRECT LARYNGOSCOPY  04/17/2012   Procedure: DIRECT LARYNGOSCOPY;  Surgeon: Flo ShanksKarol Wolicki, MD;  [facility];  Service: ENT;  Laterality: Bilateral;  . I&D EXTREMITY  04/18/2012   Procedure: IRRIGATION AND DEBRIDEMENT EXTREMITY;  Surgeon: Sharma CovertFred W Ortmann, MD;  Location: [facility] OR;  Service: Orthopedics;  Laterality: Left;  lt forearm and wrist incicsion and drainage  . I&D EXTREMITY  04/24/2012   Procedure: IRRIGATION AND DEBRIDEMENT EXTREMITY;  Surgeon: Sharma CovertFred W Ortmann, MD;  Location: [facility] OR;  Service: Orthopedics;  Laterality: Left;  . LYMPH NODE BIOPSY  04/24/2012   Procedure: LYMPH NODE BIOPSY;  Surgeon: Flo ShanksKarol Wolicki, MD;  [facility];  Service: ENT;  Laterality: Right;  Right Neck/Upper Chest Exploration  . RADICAL NECK DISSECTION  04/17/2012   Procedure: RADICAL NECK DISSECTION;  Surgeon:  Flo ShanksKarol Wolicki, MD;  [facility];  Service: ENT;  Laterality: Right;  Right Neck Exploration  . RIGID ESOPHAGOSCOPY  04/17/2012   Procedure: RIGID ESOPHAGOSCOPY;  Surgeon: Flo ShanksKarol Wolicki, MD;  [facility];  Service: ENT;  Laterality: Bilateral;  . TOTAL HIP ARTHROPLASTY  09/18/2012   Procedure: TOTAL HIP ARTHROPLASTY;  Surgeon: Nestor LewandowskyFrank J Rowan, MD;  Location: [facility] OR;  Service: Orthopedics;  Laterality: Left;  left total hip arthroplasty    No family history on file.  Social History   Socioeconomic History  . Marital status: Married    Spouse name: Not on file  . Number of children: Not on file  . Years of education: Not on file  . Highest education level: Not on file  Occupational History  . Not on file  Social Needs  . Financial resource strain: Not on file  . Food insecurity:    Worry: Not on file    Inability: Not on file  . Transportation needs:    Medical: Not on file    Non-medical: Not on file  Tobacco Use  . Smoking status: Never Smoker  . Smokeless tobacco: Never Used  Substance and Sexual Activity  . Alcohol use: Yes    Alcohol/week: 3.0 standard drinks    Types: 3 drink(s) per week    Comment: occassional  . Drug use: No  . Sexual activity: Yes    Birth control/protection: None  Lifestyle  .  Physical activity:    Days per week: Not on file    Minutes per session: Not on file  . Stress: Not on file  Relationships  . Social connections:    Talks on phone: Not on file    Gets together: Not on file    Attends religious service: Not on file    Active member of club or organization: Not on file    Attends meetings of clubs or organizations: Not on file    Relationship status: Not on file  . Intimate partner violence:    Fear of current or ex partner: Not on file    Emotionally abused: Not on file    Physically abused: Not on file    Forced sexual activity: Not on file  Other Topics Concern  . Not on file  Social History Narrative   Used to be  Corporate investment banker in the past      Lives in [place].   Wife Veva Holes (c) [phone]              Physical Exam  Vital Signs and Nursing Notes reviewed Vitals:   12/10/18 0830 12/10/18 0845  BP: (!) 139/108 (!) 146/101  Pulse: 73 77  Resp:    Temp:    SpO2: 98% 100%    CONSTITUTIONAL: Well-appearing, NAD NEURO:  Alert and oriented x 3, decreased strength and sensation to the right upper extremity EYES:  eyes equal and reactive ENT/NECK:  no LAD, no JVD CARDIO: Regular rate, well-perfused, normal S1 and S2 PULM:  CTAB no wheezing or rhonchi GI/GU:  normal bowel sounds, non-distended, non-tender MSK/SPINE:  No gross deformities, no edema; positive Spurling test SKIN:  no rash, atraumatic PSYCH:  Appropriate speech and behavior  Diagnostic and Interventional Summary    Labs Reviewed  CBC - Abnormal; Notable for the following components:      Result Value   RBC 6.36 (*)    MCV 73.6 (*)    MCH 21.9 (*)    MCHC 29.7 (*)    RDW 18.2 (*)    All other components within normal limits  COMPREHENSIVE METABOLIC PANEL    MR CERVICAL SPINE WO CONTRAST  Final Result      Medications  oxyCODONE (Oxy IR/ROXICODONE) immediate release tablet 5 mg (5 mg Oral Given 12/10/18 0857)     Procedures Critical Care  ED Course and Medical Decision Making  I have reviewed the triage vital signs and the nursing notes.  Pertinent labs & imaging results that were available during my care of the patient were reviewed by me and considered in my medical decision making (see below for details).  Concern for cervical radiculopathy versus myelopathy in this 57 year old male with progressive neurological deficit following neck injury.  No neurological deficits, no ptosis, no evidence of vascular injury.  MRI pending.  MRI reveals radiculopathy but no evidence to suggest myelopathy.  Patient will call the neurosurgeon provided for follow-up.  Given prescription for prednisone taper.  After the  discussed management above, the patient was determined to be safe for discharge.  The patient was in agreement with this plan and all questions regarding their care were answered.  ED return precautions were discussed and the patient will return to the ED with any significant worsening of condition.  Elmer Sow. Pilar Plate, MD [facility] Emergency Medicine [facility] [email or web address]  Final Clinical Impressions(s) / ED Diagnoses     ICD-10-CM   1. Neck pain M54.2   2. Cervical  radiculopathy M54.12     ED Discharge Orders         Ordered    predniSONE (DELTASONE) 10 MG tablet     12/10/18 1031             Sabas Sous, MD 12/10/18 1035

## 2019-09-30 ENCOUNTER — Emergency Department (HOSPITAL_COMMUNITY)
Admission: EM | Admit: 2019-09-30 | Discharge: 2019-09-30 | Disposition: A | Discharge status: Home / Self Care | Payer: Self-pay | Attending: Emergency Medicine | Admitting: Emergency Medicine

## 2019-09-30 ENCOUNTER — Encounter (HOSPITAL_COMMUNITY): Payer: Self-pay | Admitting: Emergency Medicine

## 2019-09-30 DIAGNOSIS — M79642 Pain in left hand: Secondary | ICD-10-CM | POA: Insufficient documentation

## 2019-09-30 DIAGNOSIS — M25561 Pain in right knee: Secondary | ICD-10-CM | POA: Insufficient documentation

## 2019-09-30 DIAGNOSIS — K2900 Acute gastritis without bleeding: Secondary | ICD-10-CM

## 2019-09-30 DIAGNOSIS — R600 Localized edema: Secondary | ICD-10-CM | POA: Insufficient documentation

## 2019-09-30 DIAGNOSIS — R112 Nausea with vomiting, unspecified: Secondary | ICD-10-CM | POA: Insufficient documentation

## 2019-09-30 DIAGNOSIS — K922 Gastrointestinal hemorrhage, unspecified: Secondary | ICD-10-CM | POA: Insufficient documentation

## 2019-09-30 DIAGNOSIS — I10 Essential (primary) hypertension: Secondary | ICD-10-CM | POA: Insufficient documentation

## 2019-09-30 DIAGNOSIS — R1010 Upper abdominal pain, unspecified: Secondary | ICD-10-CM | POA: Insufficient documentation

## 2019-09-30 DIAGNOSIS — G8929 Other chronic pain: Secondary | ICD-10-CM | POA: Insufficient documentation

## 2019-09-30 DIAGNOSIS — K2901 Acute gastritis with bleeding: Secondary | ICD-10-CM | POA: Insufficient documentation

## 2019-09-30 LAB — COMPREHENSIVE METABOLIC PANEL
ALT: 33 U/L (ref 0–44)
AST: 52 U/L — ABNORMAL HIGH (ref 15–41)
Albumin: 3.8 g/dL (ref 3.5–5.0)
Alkaline Phosphatase: 83 U/L (ref 38–126)
Anion gap: 10 (ref 5–15)
BUN: 7 mg/dL (ref 6–20)
CO2: 26 mmol/L (ref 22–32)
Calcium: 9.6 mg/dL (ref 8.9–10.3)
Chloride: 99 mmol/L (ref 98–111)
Creatinine, Ser: 1.16 mg/dL (ref 0.61–1.24)
GFR calc Af Amer: >60 mL/min (ref >60)
GFR calc non Af Amer: >60 mL/min (ref >60)
Glucose, Bld: 109 mg/dL — ABNORMAL HIGH (ref 70–99)
Potassium: 3.9 mmol/L (ref 3.5–5.1)
Sodium: 135 mmol/L (ref 135–145)
Total Bilirubin: 1 mg/dL (ref 0.3–1.2)
Total Protein: 8.3 g/dL — ABNORMAL HIGH (ref 6.5–8.1)

## 2019-09-30 LAB — CBC
HCT: 43.6 % (ref 39.0–52.0)
Hemoglobin: 13.3 g/dL (ref 13.0–17.0)
MCH: 23.5 pg — ABNORMAL LOW (ref 26.0–34.0)
MCHC: 30.5 g/dL (ref 30.0–36.0)
MCV: 76.9 fL — ABNORMAL LOW (ref 80.0–100.0)
Platelets: 237 10*3/uL (ref 150–400)
RBC: 5.67 MIL/uL (ref 4.22–5.81)
RDW: 14.2 % (ref 11.5–15.5)
WBC: 9.4 10*3/uL (ref 4.0–10.5)
nRBC: 0 % (ref 0.0–0.2)

## 2019-09-30 LAB — TYPE AND SCREEN
ABO/RH(D): O POS
Antibody Screen: NEGATIVE

## 2019-09-30 MED ORDER — ALUM & MAG HYDROXIDE-SIMETH 200-200-20 MG/5ML PO SUSP
30.0000 mL | Freq: Once | ORAL | Status: AC
  Administered 2019-09-30: 30 mL via ORAL
  Filled 2019-09-30: qty 30

## 2019-09-30 MED ORDER — PANTOPRAZOLE SODIUM 40 MG IV SOLR
40.0000 mg | Freq: Once | INTRAVENOUS | Status: AC
Start: 2019-09-30 — End: 2019-09-30
  Administered 2019-09-30: 40 mg via INTRAVENOUS
  Filled 2019-09-30: qty 40

## 2019-09-30 MED ORDER — SODIUM CHLORIDE 0.9 % IV BOLUS
1000.0000 mL | Freq: Once | INTRAVENOUS | Status: AC
  Administered 2019-09-30: 1000 mL via INTRAVENOUS

## 2019-09-30 MED ORDER — OMEPRAZOLE 20 MG PO CPDR: 20.0000 mg | DELAYED_RELEASE_CAPSULE | Freq: Every day | ORAL | 0 refills | Status: AC

## 2019-09-30 MED ORDER — LIDOCAINE VISCOUS HCL 2 % MT SOLN
15.0000 mL | Freq: Once | OROMUCOSAL | Status: AC
Start: 2019-09-30 — End: 2019-09-30
  Administered 2019-09-30: 15 mL via ORAL
  Filled 2019-09-30: qty 15

## 2019-09-30 NOTE — ED Notes (Signed)
Family at bedside. 

## 2019-09-30 NOTE — Discharge Instructions (Signed)
Your gastrointestinal bleeding is likely due to gastritis or inflammations of your stomach.  Please avoid alcohol use and avoid any anti-inflammatory medication use such as Goody's powders or BC powders as it can worsen your symptoms.  Take Prilosec daily.  Call and follow-up closely with GI specialist for further care.  Return if you have any concern.

## 2019-09-30 NOTE — ED Provider Notes (Signed)
[place] EMERGENCY DEPARTMENT Provider Note   CSN: [id number] Arrival date & time: 09/30/19  0911     History   Chief Complaint Chief Complaint  Patient presents with  . GI Bleeding    HPI [name] is a 58 y.o. male.     The history is provided by the patient and medical records. No language interpreter was used.     58 year old male with history of hypertension presenting for evaluation of GI bleeding.  Patient report for the past week he has had upper abdominal discomfort with associated nausea, vomiting with streaks of blood as well as having black tarry stool.  He was seen at [facility] on 09/24/2019 for his complaint.  Patient report he has blood work done, as well as abdominal pelvis CT scan, and subsequently diagnosed with gastritis.  Patient was discharged home with antinausea medication and PPI.  He has been taking the medication but report no improvement.  He now complaining of pain in his joints with joint swelling.  Patient admits that he has history of arthritis in which he takes [facility] powders on a consistent basis.  He also admits to drinking alcohol on a regular basis.  He denies any fever chills chest pain shortness of breath or dysuria.  He describes abdominal pain as a sharp uncomfortable sensation, waxing waning, 8 out of 10 with increased bloating.  Past Medical History:  Diagnosis Date  . Hypertension     Patient Active Problem List   Diagnosis Date Noted  . Hip pain, left 09/15/2012    Past Surgical History:  Procedure Laterality Date  . BACK SURGERY    . DIRECT LARYNGOSCOPY  04/17/2012   Procedure: DIRECT LARYNGOSCOPY;  Surgeon: Jodi Marble, MD;  Location: [place];  Service: ENT;  Laterality: Bilateral;  . I&D EXTREMITY  04/18/2012   Procedure: IRRIGATION AND DEBRIDEMENT EXTREMITY;  Surgeon: Linna Hoff, MD;  Location: [place];  Service: Orthopedics;  Laterality: Left;  lt forearm and wrist incicsion and drainage  . I&D  EXTREMITY  04/24/2012   Procedure: IRRIGATION AND DEBRIDEMENT EXTREMITY;  Surgeon: Linna Hoff, MD;  Location: [place];  Service: Orthopedics;  Laterality: Left;  . LYMPH NODE BIOPSY  04/24/2012   Procedure: LYMPH NODE BIOPSY;  Surgeon: Jodi Marble, MD;  Location: [place];  Service: ENT;  Laterality: Right;  Right Neck/Upper Chest Exploration  . RADICAL NECK DISSECTION  04/17/2012   Procedure: RADICAL NECK DISSECTION;  Surgeon: Jodi Marble, MD;  Location: [place];  Service: ENT;  Laterality: Right;  Right Neck Exploration  . RIGID ESOPHAGOSCOPY  04/17/2012   Procedure: RIGID ESOPHAGOSCOPY;  Surgeon: Jodi Marble, MD;  Location: [place];  Service: ENT;  Laterality: Bilateral;  . TOTAL HIP ARTHROPLASTY  09/18/2012   Procedure: TOTAL HIP ARTHROPLASTY;  Surgeon: Kerin Salen, MD;  Location: [place];  Service: Orthopedics;  Laterality: Left;  left total hip arthroplasty        Home Medications    Prior to Admission medications   Medication Sig Start Date End Date Taking? Authorizing Provider  Aspirin-Salicylamide-Caffeine (BC HEADACHE POWDER PO) Take 1 Package by mouth as needed (pain).    [provider]  methocarbamol (ROBAXIN) 500 MG tablet Take 1 tablet (500 mg total) by mouth every 8 (eight) hours as needed. 05/24/18   Petrucelli, Samantha R, PA-C  naproxen (NAPROSYN) 500 MG tablet Take 1 tablet (500 mg total) by mouth 2 (two) times daily. 05/24/18   Petrucelli, Aldona Bar  R, PA-C  predniSONE (DELTASONE) 10 MG tablet Take 4 tablets once a day for 3 days. Take 3 tablets once a day for the next 3 days. Take 2 tablets once a day for the next 3 days. Take 1 tablet once a day for the next 3 days. 12/10/18   Sabas Sous, MD    Family History No family history on file.  Social History Social History   Tobacco Use  . Smoking status: Never Smoker  . Smokeless tobacco: Never Used  Substance Use Topics  . Alcohol use: Yes    Alcohol/week: 3.0 standard drinks    Types: 3 drink(s) per  week    Comment: occassional  . Drug use: No     Allergies   Patient has no known allergies.   Review of Systems Review of Systems  All other systems reviewed and are negative.    Physical Exam Updated Vital Signs BP (!) 169/110 (BP Location: Right Arm)   Pulse (!) 105   Temp 98.4 F (36.9 C) (Oral)   Resp 14   SpO2 100%   Physical Exam Vitals signs and nursing note reviewed.  Constitutional:      General: He is not in acute distress.    Appearance: He is well-developed.  HENT:     Head: Atraumatic.  Eyes:     Conjunctiva/sclera: Conjunctivae normal.  Neck:     Musculoskeletal: Neck supple.  Cardiovascular:     Rate and Rhythm: Tachycardia present.     Pulses: Normal pulses.     Heart sounds: Normal heart sounds.  Pulmonary:     Effort: Pulmonary effort is normal.     Breath sounds: Normal breath sounds.  Abdominal:     Palpations: Abdomen is soft.     Tenderness: There is abdominal tenderness (Epigastric tenderness without guarding or rebound tenderness).  Musculoskeletal:        General: Swelling (Right knee: Moderate edema noted to the knee with normal flexion extension and no erythema.  Left index finger.  Swollen DIP joint appears chronic) present.  Skin:    Findings: No rash.  Neurological:     Mental Status: He is alert and oriented to person, place, and time.  Psychiatric:        Mood and Affect: Mood normal.      ED Treatments / Results  Labs (all labs ordered are listed, but only abnormal results are displayed) Labs Reviewed  COMPREHENSIVE METABOLIC PANEL - Abnormal; Notable for the following components:      Result Value   Glucose, Bld 109 (*)    Total Protein 8.3 (*)    AST 52 (*)    All other components within normal limits  CBC - Abnormal; Notable for the following components:   MCV 76.9 (*)    MCH 23.5 (*)    All other components within normal limits  TYPE AND SCREEN    EKG None  Radiology No results found.  Procedures  Procedures (including critical care time)  Medications Ordered in ED Medications  alum & mag hydroxide-simeth (MAALOX/MYLANTA) 200-200-20 MG/5ML suspension 30 mL (30 mLs Oral Given 09/30/19 1016)    And  lidocaine (XYLOCAINE) 2 % viscous mouth solution 15 mL (15 mLs Oral Given 09/30/19 1016)  sodium chloride 0.9 % bolus 1,000 mL (0 mLs Intravenous Stopped 09/30/19 1253)  pantoprazole (PROTONIX) injection 40 mg (40 mg Intravenous Given 09/30/19 1016)     Initial Impression / Assessment and Plan / ED Course  I have  reviewed the triage vital signs and the nursing notes.  Pertinent labs & imaging results that were available during my care of the patient were reviewed by me and considered in my medical decision making (see chart for details).        BP (!) 139/99   Pulse 83   Temp 98.4 F (36.9 C) (Oral)   Resp 14   SpO2 100%    Final Clinical Impressions(s) / ED Diagnoses   Final diagnoses:  Upper GI bleed  Acute gastritis, presence of bleeding unspecified, unspecified gastritis type    ED Discharge Orders         Ordered    omeprazole (PRILOSEC) 20 MG capsule  Daily     09/30/19 1405         10:04 AM Patient here with epigastric abdominal pain and associated occasional hematemesis with trace of blood and black tarry stool suggestive of upper GI bleed.  This is likely related to chronic alcohol use as well as regular use of NSAIDs.  Joint pains appears to be chronic as well in which he takes NSAIDs on a regular basis.  Will provide GI cocktail, Protonix IV drip, check basic labs and check platelets.  IV fluid given.  2:03 PM Labs are reassuring, normal platelets, normal hemoglobin, electrolyte panels are reassuring, patient with improvement with symptoms after receiving treatment.  He is resting comfortably.  Encourage patient to avoid alcohol use and regular NSAID use as it can worsen his symptoms.  Suspect gastritis secondary to PUD.  Encourage patient to follow-up with GI  specialist for further care.  Return caution discussed.   Fayrene Helperran, Chon Buhl, PA-C 09/30/19 1409    Mancel BaleWentz, Elliott, MD 09/30/19 2016

## 2019-09-30 NOTE — ED Notes (Signed)
Patient verbalizes understanding of discharge instructions. Opportunity for questioning and answers were provided. Armband removed by staff, pt discharged from ED.  

## 2019-09-30 NOTE — ED Notes (Signed)
ED Provider at bedside. 

## 2019-09-30 NOTE — ED Triage Notes (Signed)
Pt reports since 9/30 he has had episodes of bright red emesis with dark red stools- was seen at [facility] and diagnosed with gastritis was started on pepcid and reglan. Pt states he has had no improvement in these symptoms. Pt has has a swollen knuckle joint in his left hand as well as his right knee. Pt states he noticed this in th last 1 week.
